# Patient Record
Sex: Female | Born: 1970
Health system: Midwestern US, Academic
[De-identification: ages and names within clinical notes are randomized; demographics above are authoritative.]

## PROBLEM LIST (undated history)

## (undated) DIAGNOSIS — C801 Malignant (primary) neoplasm, unspecified: Secondary | ICD-10-CM

## (undated) DIAGNOSIS — N649 Disorder of breast, unspecified: Secondary | ICD-10-CM

## (undated) DIAGNOSIS — K3184 Gastroparesis: Secondary | ICD-10-CM

## (undated) DIAGNOSIS — C50919 Malignant neoplasm of unspecified site of unspecified female breast: Secondary | ICD-10-CM

## (undated) HISTORY — DX: Disorder of breast, unspecified: N64.9

## (undated) HISTORY — PX: BREAST SURGERY: SHX581

## (undated) HISTORY — PX: BREAST LUMPECTOMY: SHX2

---

## 1999-02-12 HISTORY — PX: TUBAL LIGATION: SHX77

## 2002-10-13 ENCOUNTER — Encounter: Payer: Self-pay | Admitting: Orthopedic Surgery

## 2002-10-13 ENCOUNTER — Ambulatory Visit (HOSPITAL_COMMUNITY): Admission: RE | Admit: 2002-10-13 | Discharge: 2002-10-13 | Payer: Self-pay | Admitting: Orthopedic Surgery

## 2002-10-13 HISTORY — PX: BACK SURGERY: SHX140

## 2002-10-27 ENCOUNTER — Encounter: Payer: Self-pay | Admitting: Neurological Surgery

## 2002-10-27 ENCOUNTER — Ambulatory Visit (HOSPITAL_COMMUNITY): Admission: RE | Admit: 2002-10-27 | Discharge: 2002-10-27 | Payer: Self-pay | Admitting: Neurological Surgery

## 2007-02-23 ENCOUNTER — Other Ambulatory Visit: Admission: RE | Admit: 2007-02-23 | Discharge: 2007-02-23 | Payer: Self-pay | Admitting: Obstetrics and Gynecology

## 2007-07-16 ENCOUNTER — Ambulatory Visit: Payer: Self-pay | Admitting: Internal Medicine

## 2007-07-29 ENCOUNTER — Ambulatory Visit (HOSPITAL_COMMUNITY): Admission: RE | Admit: 2007-07-29 | Discharge: 2007-07-29 | Payer: Self-pay | Admitting: Internal Medicine

## 2007-08-12 HISTORY — PX: ESOPHAGOGASTRODUODENOSCOPY: SHX1529

## 2007-09-10 ENCOUNTER — Ambulatory Visit: Payer: Self-pay | Admitting: Internal Medicine

## 2007-09-10 ENCOUNTER — Ambulatory Visit (HOSPITAL_COMMUNITY): Admission: RE | Admit: 2007-09-10 | Discharge: 2007-09-10 | Payer: Self-pay | Admitting: Internal Medicine

## 2007-09-16 ENCOUNTER — Encounter (HOSPITAL_COMMUNITY): Admission: RE | Admit: 2007-09-16 | Discharge: 2007-10-16 | Payer: Self-pay | Admitting: Internal Medicine

## 2007-12-16 ENCOUNTER — Emergency Department (HOSPITAL_COMMUNITY): Admission: EM | Admit: 2007-12-16 | Discharge: 2007-12-16 | Payer: Self-pay | Admitting: Emergency Medicine

## 2007-12-19 ENCOUNTER — Ambulatory Visit: Payer: Self-pay | Admitting: Internal Medicine

## 2007-12-29 ENCOUNTER — Ambulatory Visit: Payer: Self-pay | Admitting: Internal Medicine

## 2008-01-14 ENCOUNTER — Ambulatory Visit (HOSPITAL_COMMUNITY): Payer: Self-pay | Admitting: Internal Medicine

## 2008-01-14 ENCOUNTER — Encounter (HOSPITAL_COMMUNITY): Admission: RE | Admit: 2008-01-14 | Discharge: 2008-02-10 | Payer: Self-pay | Admitting: Internal Medicine

## 2010-02-11 DIAGNOSIS — C50919 Malignant neoplasm of unspecified site of unspecified female breast: Secondary | ICD-10-CM

## 2010-02-11 HISTORY — DX: Malignant neoplasm of unspecified site of unspecified female breast: C50.919

## 2010-03-04 ENCOUNTER — Encounter: Payer: Self-pay | Admitting: Internal Medicine

## 2010-06-26 NOTE — Assessment & Plan Note (Signed)
[name], [name]                    CHART#:  [id number]   DATE:  07/16/2007                       DOB:  [birth year]   PRIMARY CARE PHYSICIAN:  Self-referred.  Dr. Sharyne Peach, [facility].   CHIEF COMPLAINT:  Intermittent episodes of feculent belching, abdominal  discomfort followed by diarrhea.   The patient is a very pleasant 40 year old Caucasian female who lives  down in [place], [place].  She presents with a 5 year history  of intermittently developing abdominal distention, foul-smelling belches  that smell feculent and the urge to vomit but she never lets herself  vomit.  She has a diminution in bowel function for a day or two  associated with these symptoms and then has diarrhea and is back to  baseline.  The symptoms used to occur at a frequency of once every month  or so but over the past 6 months have become more and more frequent to  the point where she has an episode once weekly.  She denies chronic  gastroesophageal reflux disease symptoms or odynophagia nor dysphagia.  No early satiety.  No abdominal discomfort whatsoever in between these  episodes.  Typically she has 1-2 formed bowel movements daily.  There  has been no melena and no hematochezia.  She denies any prior history of  gastrointestinal illness although she states that she was told she had a  blockage in her intestine as a teenager.  She remembers being severely  constipated back then and had very poor eating habits.  She never really  was hospitalized or had any imaging studies.  Her only surgeries  included tubal ligation and back surgery.  She has not had any GI  surgery and no family history of any gastrointestinal illness.   PAST MEDICAL HISTORY:  Unremarkable for chronic illnesses.   PAST SURGICAL HISTORY:  Tubal ligation, back surgery.   CURRENT MEDICATIONS:  None.   ALLERGIES:  Augmentin causes hives.   FAMILY HISTORY:  Mother has history of recurrent diverticulitis.   Father  has had some problems with atrial fibrillation.  Two sisters and two  brothers all in good health.   SOCIAL HISTORY:  The patient is married.  She has two children.  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  No  tobacco.  Occasional coffee consumption and consumer of alcohol.  No  illicit drugs.   REVIEW OF SYSTEMS:  No chest pain or dyspnea on exertion.  No fever or  chills.  No change in weight.   PHYSICAL EXAMINATION:  GENERAL:  Reveals a pleasant 40 year old lady  resting.  VITAL SIGNS:  Weight 180, height 5 feet 7 inches.  Temperature 98, BP  94/68, pulse 72.  SKIN:  Warm and dry.  No jaundice.  HEENT:  No scleral icterus.  JVD is not prominent.  CHEST:  Lungs are clear to auscultation.  BREASTS:  Exam deferred.  ABDOMEN:  Flat.  Positive bowel sounds.  Soft, nontender, without  appreciable mass or organomegaly.  EXTREMITIES:  No edema.  RECTAL:  Good sphincter tone.  Scant brown stool in the rectal vault.  No masses.  Stool Hemoccult negative.   IMPRESSION:  The patient is a very pleasant 40 year old lady who  presents for further evaluation of worsening symptoms of  feculent  belching, abdominal distention which lasts about 2 days followed by a  short period of diarrhea.  Not mentioned above her sister, [name] RN  insisted that she come see me to evaluate the above mentioned symptoms.   It almost sounds like to me she is having an intermittent partial small  bowel obstruction.  She is virtually asymptomatic in between episodes  which as stated above occur more frequently these days.  The patient  tells me that Dr. Sharyne Peach down in [place], she is now down in  [place] has done recent battery of labs and we would like to get  copies of those studies for review.  I feel we ought to go ahead and  image her GI tract to see if there is any structural lesion contributing  to her symptoms.  To this end I have recommended she go ahead  and have a  contrast CT of the abdomen and pelvis in the near future to further  evaluate her symptoms.  We will make further recommendations once the CT  is available for review.       Jonathon Bellows, M.D.  Electronically Signed     RMR/MEDQ  D:  07/16/2007  T:  07/16/2007  Job:  [id number]

## 2010-06-26 NOTE — Assessment & Plan Note (Signed)
[name], SCHLOTTMAN                    CHART#:  [id number]   DATE:  12/29/2007                       DOB:  August 07, 1970   FOLLOWUP:  Last seen on 09/10/2007 at that time she underwent EGD, which  demonstrated a large gastric diverticulum, but no other abnormalities.  Solid-phase gastric emptying study demonstrated markedly delayed gastric  emptying with only approximately 49% emptying at 2 hours and talked  about the various treatment options.  We would stay away from Reglan and  went with an EES 200 mg per 5 mL one-half teaspoon a.c. and nightly.  She took 2 months worth of therapy.  This pretty much abolished her  nausea and regurgitation symptoms.  She stopped taking it and states she  has continued to do well.  She decided to start taking some Acidophilus  __________ and and probiotic supplement.  She feels this makes her feel  well and has not any regurgitation or abdominal pain.  Bowel function  has been normal.  She has gained 2 pounds since she was last seen here.  Of note, early on in the workup of nausea, we had to plan to get some  labs, they were just recently done.  They included a TSH, which came  back normal at 1.038 and however, a.m. cortisol came back very low at  1.6.  She is really not have any symptoms, otherwise to go with adrenal  insufficiency and as a matter of fact, is pretty much completely  asymptomatic at this time.   CURRENT MEDICATIONS:  Probiotic supplement and ibuprofen p.r.n.   PHYSICAL EXAMINATION:  GENERAL:  Today, pleasant in no acute distress.  VITAL SIGNS:  Weight 182, up 2 pounds, height 5 feet 7 inches,  temperature 98.7, BP 100/70, and pulse 72.  SKIN:  Warm and dry.  No jaundice.  She has no hyperpigmentation.  ABDOMEN:  Flat, positive bowel sounds, so succussion splash.  Abdomen  entirely soft and nontender without appreciable mass or organomegaly.   ASSESSMENT:  Gastroparesis/gastric diverticulum, symptoms quiescent now  on a probiotic.  She is  on prokinetic therapy.  I do not really have any  reason to think that she is not going to have recurrent symptoms in the  future.  She seems to think that the probiotic supplement is helping.  I  certainly do not think it would hurt anything, but doubt it is giving  her any significant pharmacological benefit, but I have  recommended  that she continue taking it as she likes it.  Next hour low fasting a.m.  cortisol of uncertain significance.  I doubt at this point in time with  positive any symptoms that she has adrenal insufficiency, but it is  loosely and we will arrange for have a Cortrosyn stimulation test in the  near future.  We will arrange that down at [facility] in [place] near  where she resides.  We will get those results back when they become  available and go from there.   I told the patient if she starts having any recurrent gastroparesis  symptoms, she is to let me know.       Jonathon Bellows, M.D.  Electronically Signed     RMR/MEDQ  D:  12/29/2007  T:  12/29/2007  Job:  [id number]   cc:  Stana Bunting, M.D.

## 2010-06-26 NOTE — Op Note (Signed)
[name], [name]                   ACCOUNT NO.:  [id number]   MEDICAL RECORD NO.:  [id number]          PATIENT TYPE:  AMB   LOCATION:  DAY                           FACILITY:  [facility]   PHYSICIAN:  R. Roetta Sessions, M.D. DATE OF BIRTH:  01-11-1971   DATE OF PROCEDURE:  09/10/2007  DATE OF DISCHARGE:                               OPERATIVE REPORT   PROCEDURE:  Diagnostic esophagogastroduodenoscopy.   INDICATIONS FOR PROCEDURE:  A 40 year old lady with chronic symptoms  postprandial bloating and feculent belching.   CT of the abdomen demonstrated a relatively large amount of food in her  stomach with a large what appears to be gastric diverticulum (her last  intake of solid food was 15 hours before the study was performed).  With  this information, she is going on 4-5 smaller meals daily and some of  her symptoms have improved, yet they persist.  EGD is now being done.  This approach has been discussed with the patient.  Risks, benefits, and  alternatives, and limitations have been reviewed.  Please see the  documentation in the medical record.   PROCEDURE NOTE:  O2 saturation, blood pressure, pulse, and respirations  were monitored throughout the entire procedure.   CONSCIOUS SEDATION:  Versed 5 mg IV, Demerol 100 mg IV in divided doses.  Cetacaine spray topical pharyngeal anesthesia.   INSTRUMENT:  Pentax video chip system.   FINDINGS:  Examination of the tubular esophagus revealed no mucosal  abnormalities.  EG junction easily traversed.  Stomach:  Gastric cavity  had a minimal amount of granular material and quite a bit of bile-  stained fluid within it showing not much of solid food.  It insufflated  very well with air.  A thorough examination of the gastric mucosa  including retroflexed view of the proximal stomach and esophagogastric  junction demonstrated a large (about 4-5 cm) gastric diverticulum coming  off the fundus and this was a fairly good size cavity with upwards of  5  cm of depth.  It did not have any food contained within it.  There was  no evidence of infiltrating process, ulcer, or other abnormality.  Pylorus was widely patent, easily traversed, and examination of the  bulb, second, and third portion revealed no abnormality.   THERAPEUTIC/DIAGNOSTIC MANEUVERS PERFORMED:  None.   The patient tolerated the procedure well and was reactive in endoscopy.   IMPRESSION:  Normal esophagus, a large gastric diverticulum as described  above, otherwise, normal gastric mucosa.  Patent pylorus normal D1  through D3.   RECOMMENDATIONS:  1. We will go ahead and proceed with a solid-phase gastric emptying      study.  We will also go ahead and do a gallbladder ultrasound at      that time to assess for coexisting occult gallbladder disease.  2. Further recommendations to follow.      Jonathon Bellows, M.D.  Electronically Signed     RMR/MEDQ  D:  09/10/2007  T:  09/10/2007  Job:  [id number]   cc:   Dr. Hillery Jacks [facility]

## 2010-06-29 NOTE — Op Note (Signed)
[name], [name]                             ACCOUNT NO.:  [id number]   MEDICAL RECORD NO.:  [id number]                   PATIENT TYPE:  OIB   LOCATION:  2899                                 FACILITY:  [facility]   PHYSICIAN:  Tia Alert, MD                  DATE OF BIRTH:  Jun 30, 1970   DATE OF PROCEDURE:  10/27/2002  DATE OF DISCHARGE:                                 OPERATIVE REPORT   PREOPERATIVE DIAGNOSES:  Lumbar disk herniation L5-S1 on the right with  right S1 radiculopathy.   POSTOPERATIVE DIAGNOSES:  Lumbar disk herniation L5-S1 on the right with  right S1 radiculopathy.   PROCEDURE:  1. Lumbar hemilaminotomy, medial facetectomy and foraminotomy of L5-S1 on     the right, followed by a microdiskectomy at L5-S1 on the right utilizing     the [facility] retractor system.  2. Microdissection.   SURGEON:  Marikay Alar, M.D.   ASSISTANT:  Donalee Citrin, M.D.   ANESTHESIA:  General endotracheal.   COMPLICATIONS:  None apparent.   INDICATIONS FOR PROCEDURE:  The patient is a 40 year old white female who  was seen in neurosurgical consultation for right leg pain.  She had an MRI  which showed a large herniated disk at L5-S1 on the right, compressing the  right S1 nerve root.  She had tried medical management for quite some time  without significant relief.  I recommended a lumbar microdiskectomy.  She  understood the risks, the benefits and the alternatives, and wished to  proceed.   DESCRIPTION OF PROCEDURE:  The patient was taken to the operating room, and  after the induction of adequate general endotracheal anesthesia she was  rolled into the prone position with the Wilson frame.  All pressure points  were padded.  Her lumbar region was prepped with DuraPrep and then draped in  the usual sterile fashion.  A small dorsal midline incision was made and  then under fluoroscopic guidance a K-wire was passed at the L5-S1 interspace  on the right side, and then sequential dilators  of the METRX retractor  system were passed until the final 6.0 cm retractor was locked into  position.  We then brought in the operating microscope.  The remainder was  done under the operating microscope.  A hemilaminotomy, medial facetectomy  and foraminotomy was performed at L5-S1 on the right utilizing the Kerrison  punches.  The ligament was identified, opened and removed with the Kerrison  punches, to expose the underlying dura and S1 nerve root.  The nerve root  was retracted medially, and the epidural venous vasculature was coagulated  with bipolar cautery and cut sharply.  There was a large subannular and  subligamentous disk herniation which was noted then.  This was incised with  a #15 blade scalpel.  A nerve hook was inserted into this and several large  fragments were removed,  and then we used a pituitary rongeur to perform a  thorough intradiskal diskectomy.  We then palpated with nerve hooks and flat-  headed dissectors to assure that there were no more compressive lesions.  We  then irrigated with copious amounts of Bacitracin containing saline  solution.  Inspected once again to assure that the nerve root was free.  We  then removed the retractor and closed the subcutaneous and the subcuticular  tissues with #3-0 Vicryl and closed the skin with DermaBond.  The drapes  were removed.  A sterile dressing was applied.   The patient was awakened from general anesthesia and transported to the  recovery room in stable condition.  All sponge, needle and instrument counts  were correct.                                                   Tia Alert, MD    DSJ/MEDQ  D:  10/27/2002  T:  10/27/2002  Job:  [id number]

## 2010-09-19 ENCOUNTER — Other Ambulatory Visit (HOSPITAL_COMMUNITY): Payer: Self-pay | Admitting: Obstetrics and Gynecology

## 2010-09-19 DIAGNOSIS — Z139 Encounter for screening, unspecified: Secondary | ICD-10-CM

## 2010-09-27 ENCOUNTER — Other Ambulatory Visit (HOSPITAL_COMMUNITY): Payer: Self-pay | Admitting: Pediatrics

## 2010-09-27 ENCOUNTER — Ambulatory Visit (HOSPITAL_COMMUNITY)
Admission: RE | Admit: 2010-09-27 | Discharge: 2010-09-27 | Disposition: A | Discharge status: Home / Self Care | Payer: BC Managed Care – PPO | Source: Ambulatory Visit | Attending: Pediatrics | Admitting: Pediatrics

## 2010-09-27 ENCOUNTER — Ambulatory Visit (HOSPITAL_COMMUNITY)
Admission: RE | Admit: 2010-09-27 | Discharge: 2010-09-27 | Disposition: A | Discharge status: Home / Self Care | Payer: BC Managed Care – PPO | Source: Ambulatory Visit | Attending: Obstetrics and Gynecology | Admitting: Obstetrics and Gynecology

## 2010-09-27 DIAGNOSIS — R52 Pain, unspecified: Secondary | ICD-10-CM

## 2010-09-27 DIAGNOSIS — Z139 Encounter for screening, unspecified: Secondary | ICD-10-CM

## 2010-09-27 DIAGNOSIS — Z1231 Encounter for screening mammogram for malignant neoplasm of breast: Secondary | ICD-10-CM | POA: Insufficient documentation

## 2010-10-04 ENCOUNTER — Other Ambulatory Visit: Payer: Self-pay | Admitting: Obstetrics and Gynecology

## 2010-10-04 DIAGNOSIS — R928 Other abnormal and inconclusive findings on diagnostic imaging of breast: Secondary | ICD-10-CM

## 2010-11-14 ENCOUNTER — Other Ambulatory Visit: Payer: Self-pay | Admitting: Obstetrics and Gynecology

## 2010-11-14 ENCOUNTER — Ambulatory Visit (HOSPITAL_COMMUNITY)
Admission: RE | Admit: 2010-11-14 | Discharge: 2010-11-14 | Disposition: A | Discharge status: Home / Self Care | Payer: BC Managed Care – PPO | Source: Ambulatory Visit | Attending: Obstetrics and Gynecology | Admitting: Obstetrics and Gynecology

## 2010-11-14 DIAGNOSIS — R921 Mammographic calcification found on diagnostic imaging of breast: Secondary | ICD-10-CM

## 2010-11-14 DIAGNOSIS — R928 Other abnormal and inconclusive findings on diagnostic imaging of breast: Secondary | ICD-10-CM

## 2010-11-16 LAB — CORTISOL-AM, BLOOD: Cortisol - AM: 7.5 ug/dL (ref 4.3–22.4)

## 2010-11-19 ENCOUNTER — Ambulatory Visit
Admission: RE | Admit: 2010-11-19 | Discharge: 2010-11-19 | Disposition: A | Discharge status: Home / Self Care | Payer: BC Managed Care – PPO | Source: Ambulatory Visit | Attending: Obstetrics and Gynecology | Admitting: Obstetrics and Gynecology

## 2010-11-19 DIAGNOSIS — R921 Mammographic calcification found on diagnostic imaging of breast: Secondary | ICD-10-CM

## 2010-12-03 ENCOUNTER — Other Ambulatory Visit (INDEPENDENT_AMBULATORY_CARE_PROVIDER_SITE_OTHER): Payer: Self-pay | Admitting: General Surgery

## 2010-12-03 ENCOUNTER — Encounter (INDEPENDENT_AMBULATORY_CARE_PROVIDER_SITE_OTHER): Payer: Self-pay | Admitting: General Surgery

## 2010-12-03 ENCOUNTER — Ambulatory Visit (INDEPENDENT_AMBULATORY_CARE_PROVIDER_SITE_OTHER): Payer: BC Managed Care – PPO | Admitting: General Surgery

## 2010-12-03 VITALS — BP 132/88 | HR 72 | Temp 98.0°F | Resp 16 | Ht 67.0 in | Wt 198.4 lb

## 2010-12-03 DIAGNOSIS — D0590 Unspecified type of carcinoma in situ of unspecified breast: Secondary | ICD-10-CM

## 2010-12-03 DIAGNOSIS — C50919 Malignant neoplasm of unspecified site of unspecified female breast: Secondary | ICD-10-CM

## 2010-12-03 DIAGNOSIS — D0501 Lobular carcinoma in situ of right breast: Secondary | ICD-10-CM | POA: Insufficient documentation

## 2010-12-03 NOTE — Patient Instructions (Addendum)
you will be scheduled for a right breast biopsy, also noted is a right partial mastectomy with needle localization. This will be done to remove the lobular carcinoma in situ and make sure that there is not any more advanced cancer present. We will decide about further plans after we get the final biopsy report back.Breast Biopsy WHY YOU NEED A BIOPSY Your caregiver has recommended that you have a breast tissue sample taken (biopsy). This is done to be certain that the lump or abnormality found in your breast is not cancerous (malignant). During a biopsy, a small piece of tissue is removed, so it can be examined under a microscope by a specialist (pathologist) who looks at tissues and cells and diagnoses abnormalities in them. Most lumps (tumors) or abnormalities, on or in the breast, are not cancerous (benign). However, biopsies are taken when your caregiver cannot be absolutely certain of what is wrong only from doing a physical exam, mammogram (breast X-ray), or other studies. A breast biopsy can tell you whether nothing more needs to be done, or you need more surgery or another type of treatment. A biopsy is done when there is:  Any undiagnosed breast mass.   Nipple abnormalities, dimpling, crusting, or ulcerations.   Calcium deposits (calcifications) or abnormalities seen on your mammogram, ultrasound, or MRI.   Suspicious changes in the breast (thickening, asymmetry) seen on mammogram.   Abnormal discharge from the nipple, especially blood.   Redness, swelling, and pain of the breast.  HOW A BIOPSY IS PERFORMED A biopsy is often performed on an outpatient basis (you go home the same day). This can be done in a hospital, clinic, or surgical center. Tissue samples (biopsies) are often done under local anesthesia (area is numbed). Sometimes general anesthetics are required, in which case you sleep through the procedure. Biopsies may remove the entire lump, a small piece of the lump, or a small  sliver of tissue removed by needle. TYPES OF BREAST BIOPSY  Fine needle aspiration. A thin needle is placed through the skin, to the lump or cyst, and cells are removed.   Core needle biopsy. A large needle with a special tip is placed through the skin, to the abnormality, and a piece of tissue is removed.   Stereotactic biopsy. A core needle with a special X-ray is used, to direct the needle to the lump or abnormal area, which is difficult to feel or cannot be felt.   Vacuum-assisted biopsy. A hollow probe and a gentle vacuum remove a sample of tissue.   Ultrasound guided core needle biopsy. You lie on your stomach, with your breast through an opening, and a high frequency ultrasound helps guide the needle to the area of the abnormality.   Open biopsy. An incision is made in the breast, and a piece of the lump or the whole lump is removed.  LET YOUR CAREGIVER KNOW ABOUT:  Allergies.   Medicines taken, including herbs, eye drops, over-the-counter medicines, and creams.   Use of steroids (by mouth or creams).   Previous problems with anesthetics or Novocaine.   If you are taking aspirin or blood thinners.   Possibility of pregnancy, if this applies.   History of blood clots (thrombophlebitis).   History of bleeding or blood problems.   Previous surgery.   Other health problems.  RISKS AND COMPLICATIONS   Bleeding.   Infection.   Allergy to medicines.   Bruising and swelling of the breast.   Alteration in the shape of the  breast.   Not finding the lump or abnormality.   Needing more surgery.  BEFORE THE PROCEDURE  You should arrive 60 minutes prior to your procedure or as directed.   Check-in at the admissions desk, to fill out necessary forms, if you are not preregistered.   There will be consent forms to sign, prior to the procedure.   There is a waiting area for your family, while you are having your biopsy.   Try to have someone with you, to drive you  home.   Do not smoke for 2 weeks before the surgery.   Let your caregiver know if you develop a cold or an infection.   Do not drink alcohol for at least 24 hours before surgery.   Wear a good support bra to the surgery.  AFTER THE PROCEDURE  After surgery, you will be taken to the recovery area, where a nurse will watch and check your progress. Once you are awake, stable, and taking fluids well, if there are no other problems, you will be allowed to go home.   Ice packs applied to your operative site may help with discomfort and keep the swelling down.   You may resume normal diet and activities as directed. Avoid strenuous activities affecting the arm on the side of the biopsy, such as tennis, swimming, heavy lifting (more than 10 pounds) or pulling.   Bruising in the breast is normal following this procedure.   Wearing a support bra, even to bed, may be more comfortable. The bra will also help keep the dressing on.   Change dressings as directed.   Your doctor may apply a pressure dressing on your breast for 24 to 48 hours.   Only take over-the-counter or prescription medicines for pain, discomfort, or fever as directed by your caregiver.   Do not take aspirin, because it can cause bleeding.  HOME CARE INSTRUCTIONS   You may resume your usual diet.   Have someone drive you home after the surgery.   Do not do any exercise, driving, lifting or general activities without your caregiver's permission.   Take medicines and over-the-counter medicines, as ordered by your caregiver.   Keep your postoperative appointments as recommended.   Do not drink alcohol while taking pain medicine.  Finding out the results of your test Not all test results are available during your visit. If your test results are not back during the visit, make an appointment with your caregiver to find out the results. Do not assume everything is normal if you have not heard from your caregiver or the  medical facility. It is important for you to follow up on all of your test results.  SEEK MEDICAL CARE IF:   You notice redness, swelling, or increasing pain in the wound.   You notice a bad smell coming from the wound or dressing.   You develop a rash.   You need stronger pain medicine.   You are having an allergic reaction or problems with your medicines.  SEEK IMMEDIATE MEDICAL CARE IF:   You have difficulty breathing.   You have a fever.   There is increased bleeding (more than a small spot) from the wound.   Pus is coming from the wound.   The wound is breaking open.  Document Released: 01/28/2005 Document Revised: 10/10/2010 Document Reviewed: 12/16/2008 [facility] Patient Information 2012 [place], [place].

## 2010-12-03 NOTE — Progress Notes (Signed)
Chief Complaint  Patient presents with  . Other    new pt- eval rt br LCIS    HPI [name] is a 40 y.o. female.    This healthy woman was referred to me by Dr. Norva Pavlov admission at the [facility] for evaluation of an area in the lateral right breast which was biopsied recently and revealed lobular carcinoma in situ.  The patient does not have any breast symptoms. Has never had a breast problem in the past. Has no pain, mass, or nipple discharge.  She went for her first ever screening mammogram. This reveals some focal calcifications in the right breast laterally. This is reportedly about a 4 mm area. Image guided biopsy was ultimately done which reveals lobular carcinoma in situ. The E Cadherin stain is negative confirming the lobular phenotype. Homone receptors were done and there was strongly positive.  The family history is negative for breast cancer and is negative for ovarian cancer. One uncle had pancreatic cancer.HPI  Past Medical History  Diagnosis Date  . Breast calcification, right     Past Surgical History  Procedure Date  . Breast biopsy 11/12/10    right  . Back surgery   . Tubal ligation 2001    Family History  Problem Relation Age of Onset  . Hypertension Mother   . Hyperlipidemia Mother   . Heart disease Father     arrythmia  . Hypertension Father   . Cancer Maternal Uncle     pancreatic    Social History History  Substance Use Topics  . Smoking status: Former Games developer  . Smokeless tobacco: Not on file   Comment: quit 10 yrs  . Alcohol Use: Yes    Allergies  Allergen Reactions  . Augmentin     Current Outpatient Prescriptions  Medication Sig Dispense Refill  . ibuprofen (ADVIL,MOTRIN) 200 MG tablet Take 200 mg by mouth every 6 (six) hours as needed.        . Pseudoephedrine HCl (SINUS DECONGESTANT PO) Take by mouth daily.          Review of Systems Review of Systems  Constitutional: Negative.  Negative for fever, chills and unexpected  weight change.  HENT: Positive for congestion. Negative for hearing loss, sore throat, trouble swallowing and voice change.   Eyes: Negative.  Negative for visual disturbance.  Respiratory: Positive for cough. Negative for apnea, chest tightness, shortness of breath and wheezing.   Cardiovascular: Negative.  Negative for chest pain, palpitations and leg swelling.  Gastrointestinal: Negative.  Negative for nausea, vomiting, abdominal pain, diarrhea, constipation, blood in stool, abdominal distention and anal bleeding.  Genitourinary: Negative.  Negative for hematuria, vaginal bleeding and difficulty urinating.  Musculoskeletal: Negative for arthralgias.  Skin: Negative for rash and wound.  Neurological: Positive for headaches. Negative for seizures and syncope.  Hematological: Negative.  Negative for adenopathy. Does not bruise/bleed easily.  Psychiatric/Behavioral: Negative.  Negative for confusion.    Blood pressure 132/88, pulse 72, temperature 98 F (36.7 C), temperature source Temporal, resp. rate 16, height 5\' 7"  (1.702 m), weight 198 lb 6.4 oz (89.994 kg).  Physical Exam Physical Exam  Constitutional: She is oriented to person, place, and time. She appears well-developed and well-nourished. No distress.  HENT:  Head: Normocephalic and atraumatic.  Nose: Nose normal.  Mouth/Throat: No oropharyngeal exudate.  Eyes: Conjunctivae and EOM are normal. Pupils are equal, round, and reactive to light. Left eye exhibits no discharge. No scleral icterus.  Neck: Neck supple. No JVD  present. No tracheal deviation present. No thyromegaly present.  Cardiovascular: Normal rate, regular rhythm, normal heart sounds and intact distal pulses.   No murmur heard. Pulmonary/Chest: Effort normal and breath sounds normal. No respiratory distress. She has no wheezes. She has no rales. She exhibits no tenderness.         Breasts moderately large.  Abdominal: Soft. Bowel sounds are normal. She exhibits no  distension and no mass. There is no tenderness. There is no rebound and no guarding.  Musculoskeletal: She exhibits no edema and no tenderness.  Lymphadenopathy:    She has no cervical adenopathy.  Neurological: She is alert and oriented to person, place, and time. She exhibits normal muscle tone. Coordination normal.  Skin: Skin is warm. No rash noted. She is not diaphoretic. No erythema. No pallor.  Psychiatric: She has a normal mood and affect. Her behavior is normal. Judgment and thought content normal.    Data Reviewed I reviewed reviewed her mammograms, and the mammogram images and the pathology report as well as the breast diagnostic profile.  Assessment    Lobular carcinoma in situ right breast, 9 o'clock position, small focal area. ER/PR positive. Excisional biopsy is indicated to exclude occult invasive carcinoma.    Plan    I had a long conversation with the patient and her husband about lobular carcinoma in situ. We discussed the differences between lobular carcinoma in situ and ductal carcinoma in situ as well as invasive cancer. She is aware that LCIS is a marker lesion for increased risk but does not require specific treatment other than clarification of diagnosis. She seems to understand all of this fairly well.  We are going to schedule her for a right partial mastectomy with needle localization in the near future.  If this is all lobular carcinoma in situ, consideration will be given to referring her to a high risk breast clinic postop.  I've discussed indications and details of surgery with the patient and her husband. Risks and complications have been outlined, including but limited to bleeding, infection, reoperation for positive margins, cosmetic deformity, nerve damage with chronic pain or numbness, cardiac pulmonary and thromboembolic problems. She seems to understand these issues well. The tunnel for questions were answered. She is in full agreement with this plan.        Kennah Hehr M 12/03/2010, 2:01 PM

## 2010-12-14 ENCOUNTER — Encounter (HOSPITAL_BASED_OUTPATIENT_CLINIC_OR_DEPARTMENT_OTHER): Payer: Self-pay | Admitting: *Deleted

## 2010-12-14 NOTE — Pre-Procedure Instructions (Signed)
To come for CMET, CBC, diff, UA

## 2010-12-17 ENCOUNTER — Inpatient Hospital Stay (HOSPITAL_BASED_OUTPATIENT_CLINIC_OR_DEPARTMENT_OTHER)
Admission: RE | Admit: 2010-12-17 | Discharge: 2010-12-17 | Disposition: A | Discharge status: Home / Self Care | Payer: BC Managed Care – PPO | Source: Ambulatory Visit

## 2010-12-17 LAB — URINALYSIS, ROUTINE W REFLEX MICROSCOPIC
Hgb urine dipstick: NEGATIVE
Nitrite: NEGATIVE
Protein, ur: NEGATIVE mg/dL
Specific Gravity, Urine: 1.005 (ref 1.005–1.030)
Urobilinogen, UA: 0.2 mg/dL (ref 0.0–1.0)

## 2010-12-17 LAB — DIFFERENTIAL
Basophils Absolute: 0 10*3/uL (ref 0.0–0.1)
Eosinophils Relative: 2 % (ref 0–5)
Lymphocytes Relative: 37 % (ref 12–46)
Lymphs Abs: 2.5 10*3/uL (ref 0.7–4.0)
Monocytes Absolute: 0.5 10*3/uL (ref 0.1–1.0)
Monocytes Relative: 7 % (ref 3–12)
Neutro Abs: 3.7 10*3/uL (ref 1.7–7.7)

## 2010-12-17 LAB — COMPREHENSIVE METABOLIC PANEL
BUN: 12 mg/dL (ref 6–23)
CO2: 27 mEq/L (ref 19–32)
Calcium: 10.5 mg/dL (ref 8.4–10.5)
Chloride: 101 mEq/L (ref 96–112)
Creatinine, Ser: 0.89 mg/dL (ref 0.50–1.10)
GFR calc Af Amer: >90 mL/min (ref >90)
GFR calc non Af Amer: 80 mL/min — ABNORMAL LOW (ref >90)
Glucose, Bld: 95 mg/dL (ref 70–99)
Total Bilirubin: 0.4 mg/dL (ref 0.3–1.2)

## 2010-12-17 LAB — CBC
HCT: 44.4 % (ref 36.0–46.0)
Hemoglobin: 15.5 g/dL — ABNORMAL HIGH (ref 12.0–15.0)
MCV: 88.3 fL (ref 78.0–100.0)
RDW: 12.4 % (ref 11.5–15.5)
WBC: 6.8 10*3/uL (ref 4.0–10.5)

## 2010-12-18 ENCOUNTER — Other Ambulatory Visit (INDEPENDENT_AMBULATORY_CARE_PROVIDER_SITE_OTHER): Payer: Self-pay | Admitting: General Surgery

## 2010-12-18 NOTE — H&P (Signed)
[name]   12/03/2010 1:30 PM Office Visit  MRN: [id number]   Description: 40 year old female  Provider: Ernestene Mention, MD  Department: [facility]        Diagnoses     Lobular carcinoma in situ of right breast   - Primary    233.0      Reason for Visit     Other    new pt- eval rt br LCIS        Vitals - Last Recorded       BP Pulse Temp(Src) Resp Ht Wt    132/88  72  98 F (36.7 C) (Temporal)  16  5\' 7"  (1.702 m)  198 lb 6.4 oz (89.994 kg)          BMI LMP            31.07 kg/m2  11/28/2010               Progress Notes     Ernestene Mention, MD  12/03/2010  2:11 PM  SignedChief Complaint   Patient presents with   .  Other       new pt- eval rt br LCIS      HPI [name] is a 40 y.o. female.     This healthy woman was referred to me by Dr. Norva Pavlov admission at the [facility] for evaluation of an area in the lateral right breast which was biopsied recently and revealed lobular carcinoma in situ.   The patient does not have any breast symptoms. Has never had a breast problem in the past. Has no pain, mass, or nipple discharge.   She went for her first ever screening mammogram. This reveals some focal calcifications in the right breast laterally. This is reportedly about a 4 mm area. Image guided biopsy was ultimately done which reveals lobular carcinoma in situ. The E Cadherin stain is negative confirming the lobular phenotype. Homone receptors were done and there was strongly positive.   The family history is negative for breast cancer and is negative for ovarian cancer. One uncle had pancreatic cancer.HPI    Past Medical History   Diagnosis  Date   .  Breast calcification, right         Past Surgical History   Procedure  Date   .  Breast biopsy  11/12/10       right   .  Back surgery     .  Tubal ligation  2001       Family History   Problem  Relation  Age of Onset   .  Hypertension  Mother     .  Hyperlipidemia  Mother     .   Heart disease  Father         arrythmia   .  Hypertension  Father     .  Cancer  Maternal Uncle         pancreatic      Social History History   Substance Use Topics   .  Smoking status:  Former Games developer   .  Smokeless tobacco:  Not on file     Comment: quit 10 yrs   .  Alcohol Use:  Yes       Allergies   Allergen  Reactions   .  Augmentin         Current Outpatient Prescriptions   Medication  Sig  Dispense  Refill   .  ibuprofen (  ADVIL,MOTRIN) 200 MG tablet  Take 200 mg by mouth every 6 (six) hours as needed.           .  Pseudoephedrine HCl (SINUS DECONGESTANT PO)  Take by mouth daily.              Review of Systems Review of Systems  Constitutional: Negative.  Negative for fever, chills and unexpected weight change.  HENT: Positive for congestion. Negative for hearing loss, sore throat, trouble swallowing and voice change.   Eyes: Negative.  Negative for visual disturbance.  Respiratory: Positive for cough. Negative for apnea, chest tightness, shortness of breath and wheezing.   Cardiovascular: Negative.  Negative for chest pain, palpitations and leg swelling.  Gastrointestinal: Negative.  Negative for nausea, vomiting, abdominal pain, diarrhea, constipation, blood in stool, abdominal distention and anal bleeding.  Genitourinary: Negative.  Negative for hematuria, vaginal bleeding and difficulty urinating.  Musculoskeletal: Negative for arthralgias.  Skin: Negative for rash and wound.  Neurological: Positive for headaches. Negative for seizures and syncope.  Hematological: Negative.  Negative for adenopathy. Does not bruise/bleed easily.  Psychiatric/Behavioral: Negative.  Negative for confusion.    Blood pressure 132/88, pulse 72, temperature 98 F (36.7 C), temperature source Temporal, resp. rate 16, height 5\' 7"  (1.702 m), weight 198 lb 6.4 oz (89.994 kg).   Physical Exam Physical Exam  Constitutional: She is oriented to person, place, and time. She appears  well-developed and well-nourished. No distress.  HENT:   Head: Normocephalic and atraumatic.   Nose: Nose normal.   Mouth/Throat: No oropharyngeal exudate.  Eyes: Conjunctivae and EOM are normal. Pupils are equal, round, and reactive to light. Left eye exhibits no discharge. No scleral icterus.  Neck: Neck supple. No JVD present. No tracheal deviation present. No thyromegaly present.  Cardiovascular: Normal rate, regular rhythm, normal heart sounds and intact distal pulses.    No murmur heard. Pulmonary/Chest: Effort normal and breath sounds normal. No respiratory distress. She has no wheezes. She has no rales. She exhibits no tenderness.         Breasts moderately large.  Abdominal: Soft. Bowel sounds are normal. She exhibits no distension and no mass. There is no tenderness. There is no rebound and no guarding.  Musculoskeletal: She exhibits no edema and no tenderness.  Lymphadenopathy:    She has no cervical adenopathy.  Neurological: She is alert and oriented to person, place, and time. She exhibits normal muscle tone. Coordination normal.  Skin: Skin is warm. No rash noted. She is not diaphoretic. No erythema. No pallor.  Psychiatric: She has a normal mood and affect. Her behavior is normal. Judgment and thought content normal.    Data Reviewed I reviewed reviewed her mammograms, and the mammogram images and the pathology report as well as the breast diagnostic profile.   Assessment   Lobular carcinoma in situ right breast, 9 o'clock position, small focal area. ER/PR positive. Excisional biopsy is indicated to exclude occult invasive carcinoma.   Plan I had a long conversation with the patient and her husband about lobular carcinoma in situ. We discussed the differences between lobular carcinoma in situ and ductal carcinoma in situ as well as invasive cancer. She is aware that LCIS is a marker lesion for increased risk but does not require specific treatment other than  clarification of diagnosis. She seems to understand all of this fairly well.   We are going to schedule her for a right partial mastectomy with needle localization in the near future.  If this is all lobular carcinoma in situ, consideration will be given to referring her to a high risk breast clinic postop.   I've discussed indications and details of surgery with the patient and her husband. Risks and complications have been outlined, including but limited to bleeding, infection, reoperation for positive margins, cosmetic deformity, nerve damage with chronic pain or numbness, cardiac pulmonary and thromboembolic problems. She seems to understand these issues well. The tunnel for questions were answered. She is in full agreement with this plan.       Ernestene Mention 12/03/2010, 2:01 PM                Not recorded             Patient Instructions     you will be scheduled for a right breast biopsy, also noted is a right partial mastectomy with needle localization. This will be done to remove the lobular carcinoma in situ and make sure that there is not any more advanced cancer present. We will decide about further plans after we get the final biopsy report back.Breast Biopsy WHY YOU NEED A BIOPSY  Your caregiver has recommended that you have a breast tissue sample taken (biopsy). This is done to be certain that the lump or abnormality found in your breast is not cancerous (malignant). During a biopsy, a small piece of tissue is removed, so it can be examined under a microscope by a specialist (pathologist) who looks at tissues and cells and diagnoses abnormalities in them. Most lumps (tumors) or abnormalities, on or in the breast, are not cancerous (benign). However, biopsies are taken when your caregiver cannot be absolutely certain of what is wrong only from doing a physical exam, mammogram (breast X-ray), or other studies. A breast biopsy can tell you whether nothing more  needs to be done, or you need more surgery or another type of treatment. A biopsy is done when there is: Any undiagnosed breast mass.   Nipple abnormalities, dimpling, crusting, or ulcerations.   Calcium deposits (calcifications) or abnormalities seen on your mammogram, ultrasound, or MRI.   Suspicious changes in the breast (thickening, asymmetry) seen on mammogram.   Abnormal discharge from the nipple, especially blood.   Redness, swelling, and pain of the breast.  HOW A BIOPSY IS PERFORMED A biopsy is often performed on an outpatient basis (you go home the same day). This can be done in a hospital, clinic, or surgical center. Tissue samples (biopsies) are often done under local anesthesia (area is numbed). Sometimes general anesthetics are required, in which case you sleep through the procedure. Biopsies may remove the entire lump, a small piece of the lump, or a small sliver of tissue removed by needle. TYPES OF BREAST BIOPSY Fine needle aspiration. A thin needle is placed through the skin, to the lump or cyst, and cells are removed.   Core needle biopsy. A large needle with a special tip is placed through the skin, to the abnormality, and a piece of tissue is removed.   Stereotactic biopsy. A core needle with a special X-ray is used, to direct the needle to the lump or abnormal area, which is difficult to feel or cannot be felt.   Vacuum-assisted biopsy. A hollow probe and a gentle vacuum remove a sample of tissue.   Ultrasound guided core needle biopsy. You lie on your stomach, with your breast through an opening, and a high frequency ultrasound helps guide the needle to the area of the abnormality.  Open biopsy. An incision is made in the breast, and a piece of the lump or the whole lump is removed.  LET YOUR CAREGIVER KNOW ABOUT: Allergies.   Medicines taken, including herbs, eye drops, over-the-counter medicines, and creams.   Use of steroids (by mouth or creams).   Previous problems  with anesthetics or Novocaine.   If you are taking aspirin or blood thinners.   Possibility of pregnancy, if this applies.   History of blood clots (thrombophlebitis).   History of bleeding or blood problems.   Previous surgery.   Other health problems.  RISKS AND COMPLICATIONS   Bleeding.   Infection.   Allergy to medicines.   Bruising and swelling of the breast.   Alteration in the shape of the breast.   Not finding the lump or abnormality.   Needing more surgery.  BEFORE THE PROCEDURE You should arrive 60 minutes prior to your procedure or as directed.   Check-in at the admissions desk, to fill out necessary forms, if you are not preregistered.   There will be consent forms to sign, prior to the procedure.   There is a waiting area for your family, while you are having your biopsy.   Try to have someone with you, to drive you home.   Do not smoke for 2 weeks before the surgery.   Let your caregiver know if you develop a cold or an infection.   Do not drink alcohol for at least 24 hours before surgery.   Wear a good support bra to the surgery.  AFTER THE PROCEDURE After surgery, you will be taken to the recovery area, where a nurse will watch and check your progress. Once you are awake, stable, and taking fluids well, if there are no other problems, you will be allowed to go home.   Ice packs applied to your operative site may help with discomfort and keep the swelling down.   You may resume normal diet and activities as directed. Avoid strenuous activities affecting the arm on the side of the biopsy, such as tennis, swimming, heavy lifting (more than 10 pounds) or pulling.   Bruising in the breast is normal following this procedure.   Wearing a support bra, even to bed, may be more comfortable. The bra will also help keep the dressing on.   Change dressings as directed.   Your doctor may apply a pressure dressing on your breast for 24 to 48 hours.   Only take over-the-counter or  prescription medicines for pain, discomfort, or fever as directed by your caregiver.   Do not take aspirin, because it can cause bleeding.  HOME CARE INSTRUCTIONS   You may resume your usual diet.   Have someone drive you home after the surgery.   Do not do any exercise, driving, lifting or general activities without your caregiver's permission.   Take medicines and over-the-counter medicines, as ordered by your caregiver.   Keep your postoperative appointments as recommended.   Do not drink alcohol while taking pain medicine.  Finding out the results of your test Not all test results are available during your visit. If your test results are not back during the visit, make an appointment with your caregiver to find out the results. Do not assume everything is normal if you have not heard from your caregiver or the medical facility. It is important for you to follow up on all of your test results.   SEEK MEDICAL CARE IF:   You notice redness,  swelling, or increasing pain in the wound.   You notice a bad smell coming from the wound or dressing.   You develop a rash.   You need stronger pain medicine.   You are having an allergic reaction or problems with your medicines.  SEEK IMMEDIATE MEDICAL CARE IF:   You have difficulty breathing.   You have a fever.   There is increased bleeding (more than a small spot) from the wound.   Pus is coming from the wound.   The wound is breaking open.  Document Released: 01/28/2005 Document Revised: 10/10/2010 Document Reviewed: 12/16/2008 [facility] Patient Information 2012 [place], [place].        Patient Instructions History Recorded      Level of Service     PR OFFICE CONSULTATION,LEVEL III [id number]      Follow-up and Disposition     Return for schedule surgery.        All Flowsheet Templates (all recorded)     Encounter Vitals Flowsheet    Custom Formula Data Flowsheet    Anthropometrics Flowsheet               Referring Provider            Ara Kussmaul       All Charges for This Encounter       Code Description Service Date Service Provider Modifiers Quantity    [phone] PR OFFICE CONSULTATION,LEVEL III 12/03/2010 Ernestene Mention, MD   1        Other Encounter Related Information     Allergies & Medications         Problem List         History         Patient-Entered Questionnaires     No data filed

## 2010-12-19 ENCOUNTER — Encounter (HOSPITAL_BASED_OUTPATIENT_CLINIC_OR_DEPARTMENT_OTHER): Payer: Self-pay | Admitting: *Deleted

## 2010-12-19 ENCOUNTER — Ambulatory Visit (HOSPITAL_BASED_OUTPATIENT_CLINIC_OR_DEPARTMENT_OTHER): Payer: BC Managed Care – PPO

## 2010-12-19 ENCOUNTER — Encounter (HOSPITAL_BASED_OUTPATIENT_CLINIC_OR_DEPARTMENT_OTHER): Payer: Self-pay

## 2010-12-19 ENCOUNTER — Encounter (HOSPITAL_BASED_OUTPATIENT_CLINIC_OR_DEPARTMENT_OTHER)
Admission: RE | Disposition: A | Discharge status: Home / Self Care | Payer: Self-pay | Source: Ambulatory Visit | Attending: General Surgery

## 2010-12-19 ENCOUNTER — Ambulatory Visit
Admission: RE | Admit: 2010-12-19 | Discharge: 2010-12-19 | Disposition: A | Discharge status: Home / Self Care | Payer: BC Managed Care – PPO | Source: Ambulatory Visit | Attending: General Surgery | Admitting: General Surgery

## 2010-12-19 ENCOUNTER — Ambulatory Visit (HOSPITAL_BASED_OUTPATIENT_CLINIC_OR_DEPARTMENT_OTHER)
Admission: RE | Admit: 2010-12-19 | Discharge: 2010-12-19 | Disposition: A | Discharge status: Home / Self Care | Payer: BC Managed Care – PPO | Source: Ambulatory Visit | Attending: General Surgery | Admitting: General Surgery

## 2010-12-19 DIAGNOSIS — D0590 Unspecified type of carcinoma in situ of unspecified breast: Secondary | ICD-10-CM | POA: Insufficient documentation

## 2010-12-19 DIAGNOSIS — C50919 Malignant neoplasm of unspecified site of unspecified female breast: Secondary | ICD-10-CM

## 2010-12-19 DIAGNOSIS — Z01812 Encounter for preprocedural laboratory examination: Secondary | ICD-10-CM | POA: Insufficient documentation

## 2010-12-19 DIAGNOSIS — E669 Obesity, unspecified: Secondary | ICD-10-CM | POA: Insufficient documentation

## 2010-12-19 HISTORY — DX: Malignant (primary) neoplasm, unspecified: C80.1

## 2010-12-19 SURGERY — MASTECTOMY PARTIAL
Anesthesia: General | Laterality: Right

## 2010-12-19 MED ORDER — LACTATED RINGERS IV SOLN
INTRAVENOUS | Status: DC
  Administered 2010-12-19 (×2): via INTRAVENOUS

## 2010-12-19 MED ORDER — MEPERIDINE HCL 25 MG/ML IJ SOLN: 6.2500 mg | INTRAMUSCULAR | Status: DC | PRN

## 2010-12-19 MED ORDER — ONDANSETRON HCL 4 MG/2ML IJ SOLN
INTRAMUSCULAR | Status: DC | PRN
  Administered 2010-12-19: 4 mg via INTRAVENOUS

## 2010-12-19 MED ORDER — PROMETHAZINE HCL 25 MG RE SUPP
25.0000 mg | Freq: Once | RECTAL | Status: AC
  Administered 2010-12-19: 25 mg via RECTAL

## 2010-12-19 MED ORDER — VANCOMYCIN HCL IN DEXTROSE 1-5 GM/200ML-% IV SOLN
1000.0000 mg | INTRAVENOUS | Status: AC
  Administered 2010-12-19: 1000 mg via INTRAVENOUS

## 2010-12-19 MED ORDER — MIDAZOLAM HCL 5 MG/5ML IJ SOLN
INTRAMUSCULAR | Status: DC | PRN
  Administered 2010-12-19: 2 mg via INTRAVENOUS

## 2010-12-19 MED ORDER — LIDOCAINE HCL (CARDIAC) 20 MG/ML IV SOLN
INTRAVENOUS | Status: DC | PRN
  Administered 2010-12-19: 100 mg via INTRAVENOUS

## 2010-12-19 MED ORDER — DEXAMETHASONE SODIUM PHOSPHATE 4 MG/ML IJ SOLN
INTRAMUSCULAR | Status: DC | PRN
  Administered 2010-12-19: 10 mg via INTRAVENOUS

## 2010-12-19 MED ORDER — HYDROMORPHONE HCL PF 1 MG/ML IJ SOLN
0.2500 mg | INTRAMUSCULAR | Status: DC | PRN
  Administered 2010-12-19 (×2): 0.5 mg via INTRAVENOUS

## 2010-12-19 MED ORDER — BUPIVACAINE-EPINEPHRINE 0.5% -1:200000 IJ SOLN
INTRAMUSCULAR | Status: DC | PRN
  Administered 2010-12-19: 10 mL

## 2010-12-19 MED ORDER — FENTANYL CITRATE 0.05 MG/ML IJ SOLN
INTRAMUSCULAR | Status: DC | PRN
  Administered 2010-12-19: 50 ug via INTRAVENOUS
  Administered 2010-12-19: 100 ug via INTRAVENOUS

## 2010-12-19 MED ORDER — HYDROCODONE-ACETAMINOPHEN 5-325 MG PO TABS: 1.0000 | ORAL_TABLET | ORAL | Status: AC | PRN

## 2010-12-19 MED ORDER — PROMETHAZINE HCL 25 MG/ML IJ SOLN: 6.2500 mg | INTRAMUSCULAR | Status: DC | PRN

## 2010-12-19 MED ORDER — PROPOFOL 10 MG/ML IV EMUL
INTRAVENOUS | Status: DC | PRN
  Administered 2010-12-19: 200 mg via INTRAVENOUS

## 2010-12-19 MED ORDER — KETOROLAC TROMETHAMINE 30 MG/ML IJ SOLN
INTRAMUSCULAR | Status: DC | PRN
  Administered 2010-12-19: 30 mg via INTRAVENOUS

## 2010-12-19 SURGICAL SUPPLY — 54 items
BENZOIN TINCTURE PRP APPL 2/3 (GAUZE/BANDAGES/DRESSINGS) IMPLANT
BLADE HEX COATED 2.75 (ELECTRODE) ×2 IMPLANT
BLADE SURG 15 STRL LF DISP TIS (BLADE) ×2 IMPLANT
BLADE SURG 15 STRL SS (BLADE) ×2
CANISTER SUCTION 1200CC (MISCELLANEOUS) ×2 IMPLANT
CHLORAPREP W/TINT 26ML (MISCELLANEOUS) ×2 IMPLANT
COVER MAYO STAND STRL (DRAPES) ×2 IMPLANT
COVER TABLE BACK 60X90 (DRAPES) ×2 IMPLANT
DECANTER SPIKE VIAL GLASS SM (MISCELLANEOUS) IMPLANT
DERMABOND ADVANCED (GAUZE/BANDAGES/DRESSINGS)
DERMABOND ADVANCED .7 DNX12 (GAUZE/BANDAGES/DRESSINGS) IMPLANT
DEVICE DUBIN W/COMP PLATE 8390 (MISCELLANEOUS) ×2 IMPLANT
DRAPE LAPAROTOMY TRNSV 102X78 (DRAPE) IMPLANT
DRAPE PED LAPAROTOMY (DRAPES) ×2 IMPLANT
DRAPE UTILITY XL STRL (DRAPES) ×2 IMPLANT
DRSG EMULSION OIL 3X3 NADH (GAUZE/BANDAGES/DRESSINGS) IMPLANT
ELECT REM PT RETURN 9FT ADLT (ELECTROSURGICAL) ×2
ELECTRODE REM PT RTRN 9FT ADLT (ELECTROSURGICAL) ×1 IMPLANT
GAUZE SPONGE 4X4 12PLY STRL LF (GAUZE/BANDAGES/DRESSINGS) IMPLANT
GAUZE SPONGE 4X4 16PLY XRAY LF (GAUZE/BANDAGES/DRESSINGS) IMPLANT
GLOVE BIOGEL PI IND STRL 6.5 (GLOVE) ×2 IMPLANT
GLOVE BIOGEL PI IND STRL 7.0 (GLOVE) ×1 IMPLANT
GLOVE BIOGEL PI INDICATOR 6.5 (GLOVE) ×2
GLOVE BIOGEL PI INDICATOR 7.0 (GLOVE) ×1
GLOVE EUDERMIC 7 POWDERFREE (GLOVE) ×2 IMPLANT
GOWN PREVENTION PLUS XLARGE (GOWN DISPOSABLE) ×2 IMPLANT
GOWN PREVENTION PLUS XXLARGE (GOWN DISPOSABLE) ×2 IMPLANT
KIT MARKER MARGIN INK (KITS) ×2 IMPLANT
NEEDLE HYPO 22GX1.5 SAFETY (NEEDLE) IMPLANT
NEEDLE HYPO 25X1 1.5 SAFETY (NEEDLE) ×2 IMPLANT
NS IRRIG 1000ML POUR BTL (IV SOLUTION) ×2 IMPLANT
PACK BASIN DAY SURGERY FS (CUSTOM PROCEDURE TRAY) ×2 IMPLANT
PENCIL BUTTON HOLSTER BLD 10FT (ELECTRODE) ×2 IMPLANT
SLEEVE SCD COMPRESS KNEE MED (MISCELLANEOUS) ×2 IMPLANT
STAPLER VISISTAT 35W (STAPLE) IMPLANT
STRIP CLOSURE SKIN 1/2X4 (GAUZE/BANDAGES/DRESSINGS) IMPLANT
SUT ETHILON 4 0 PS 2 18 (SUTURE) IMPLANT
SUT MON AB 4-0 PC3 18 (SUTURE) IMPLANT
SUT SILK 2 0 SH (SUTURE) IMPLANT
SUT VIC AB 2-0 SH 27 (SUTURE) ×1
SUT VIC AB 2-0 SH 27XBRD (SUTURE) ×1 IMPLANT
SUT VIC AB 3-0 FS2 27 (SUTURE) IMPLANT
SUT VIC AB 4-0 P-3 18XBRD (SUTURE) IMPLANT
SUT VIC AB 4-0 P3 18 (SUTURE)
SUT VICRYL 3-0 CR8 SH (SUTURE) IMPLANT
SUT VICRYL 4-0 PS2 18IN ABS (SUTURE) IMPLANT
SYR BULB 3OZ (MISCELLANEOUS) ×2 IMPLANT
SYR CONTROL 10ML LL (SYRINGE) ×2 IMPLANT
TAPE HYPAFIX 4 X10 (GAUZE/BANDAGES/DRESSINGS) IMPLANT
TOWEL OR NON WOVEN STRL DISP B (DISPOSABLE) ×2 IMPLANT
TRAY DSU PREP LF (CUSTOM PROCEDURE TRAY) ×2 IMPLANT
TUBE CONNECTING 20X1/4 (TUBING) ×2 IMPLANT
WATER STERILE IRR 1000ML POUR (IV SOLUTION) ×2 IMPLANT
YANKAUER SUCT BULB TIP NO VENT (SUCTIONS) ×2 IMPLANT

## 2010-12-19 NOTE — Transfer of Care (Signed)
Immediate Anesthesia Transfer of Care Note  Patient: [name]  Procedure(s) Performed:  MASTECTOMY PARTIAL - right partial mastectomy and needle localization  Patient Location: PACU  Anesthesia Type: General  Level of Consciousness: awake, alert  and sedated  Airway & Oxygen Therapy: Patient Spontanous Breathing and Patient connected to face mask oxygen  Post-op Assessment: Report given to PACU RN, Post -op Vital signs reviewed and stable and Patient moving all extremities  Post vital signs: Reviewed and stable  Complications: No apparent anesthesia complications

## 2010-12-19 NOTE — Anesthesia Preprocedure Evaluation (Addendum)
Anesthesia Evaluation  Patient identified by MRN, date of birth, ID band Patient awake    Reviewed: Allergy & Precautions, H&P , NPO status , Patient's Chart, lab work & pertinent test results  Airway Mallampati: I TM Distance: >3 FB Neck ROM: Full    Dental No notable dental hx.    Pulmonary neg pulmonary ROS,    Pulmonary exam normal       Cardiovascular neg cardio ROS Regular Normal    Neuro/Psych    GI/Hepatic   Endo/Other    Renal/GU      Musculoskeletal   Abdominal (+) obese,   Peds  Hematology   Anesthesia Other Findings   Reproductive/Obstetrics                           Anesthesia Physical Anesthesia Plan  ASA: I  Anesthesia Plan: General   Post-op Pain Management:    Induction: Intravenous  Airway Management Planned: LMA  Additional Equipment:   Intra-op Plan:   Post-operative Plan: Extubation in OR  Informed Consent: I have reviewed the patients History and Physical, chart, labs and discussed the procedure including the risks, benefits and alternatives for the proposed anesthesia with the patient or authorized representative who has indicated his/her understanding and acceptance.   Dental Advisory Given  Plan Discussed with: CRNA, Surgeon and Anesthesiologist  Anesthesia Plan Comments:        Anesthesia Quick Evaluation

## 2010-12-19 NOTE — H&P (View-Only) (Signed)
 [name]   12/03/2010 1:30 PM Office Visit  MRN: [id number]   Description: 40 year old female  Provider: Abshir Paolini M, MD  Department: [facility]        Diagnoses     Lobular carcinoma in situ of right breast   - Primary    233.0      Reason for Visit     Other    new pt- eval rt br LCIS        Vitals - Last Recorded       BP Pulse Temp(Src) Resp Ht Wt    132/88  72  98 F (36.7 C) (Temporal)  16  5' 7" (1.702 m)  198 lb 6.4 oz (89.994 kg)          BMI LMP            31.07 kg/m2  11/28/2010               Progress Notes     Amiylah Anastos M, MD  12/03/2010  2:11 PM  SignedChief Complaint   Patient presents with   .  Other       new pt- eval rt br LCIS      HPI [name] is a 40 y.o. female.     This healthy woman was referred to me by Dr. Elizabeth Brown admission at the [facility] for evaluation of an area in the lateral right breast which was biopsied recently and revealed lobular carcinoma in situ.   The patient does not have any breast symptoms. Has never had a breast problem in the past. Has no pain, mass, or nipple discharge.   She went for her first ever screening mammogram. This reveals some focal calcifications in the right breast laterally. This is reportedly about a 4 mm area. Image guided biopsy was ultimately done which reveals lobular carcinoma in situ. The E Cadherin stain is negative confirming the lobular phenotype. Homone receptors were done and there was strongly positive.   The family history is negative for breast cancer and is negative for ovarian cancer. One uncle had pancreatic cancer.HPI    Past Medical History   Diagnosis  Date   .  Breast calcification, right         Past Surgical History   Procedure  Date   .  Breast biopsy  11/12/10       right   .  Back surgery     .  Tubal ligation  2001       Family History   Problem  Relation  Age of Onset   .  Hypertension  Mother     .  Hyperlipidemia  Mother     .   Heart disease  Father         arrythmia   .  Hypertension  Father     .  Cancer  Maternal Uncle         pancreatic      Social History History   Substance Use Topics   .  Smoking status:  Former Smoker   .  Smokeless tobacco:  Not on file     Comment: quit 10 yrs   .  Alcohol Use:  Yes       Allergies   Allergen  Reactions   .  Augmentin         Current Outpatient Prescriptions   Medication  Sig  Dispense  Refill   .  ibuprofen (  ADVIL,MOTRIN) 200 MG tablet  Take 200 mg by mouth every 6 (six) hours as needed.           .  Pseudoephedrine HCl (SINUS DECONGESTANT PO)  Take by mouth daily.              Review of Systems Review of Systems  Constitutional: Negative.  Negative for fever, chills and unexpected weight change.  HENT: Positive for congestion. Negative for hearing loss, sore throat, trouble swallowing and voice change.   Eyes: Negative.  Negative for visual disturbance.  Respiratory: Positive for cough. Negative for apnea, chest tightness, shortness of breath and wheezing.   Cardiovascular: Negative.  Negative for chest pain, palpitations and leg swelling.  Gastrointestinal: Negative.  Negative for nausea, vomiting, abdominal pain, diarrhea, constipation, blood in stool, abdominal distention and anal bleeding.  Genitourinary: Negative.  Negative for hematuria, vaginal bleeding and difficulty urinating.  Musculoskeletal: Negative for arthralgias.  Skin: Negative for rash and wound.  Neurological: Positive for headaches. Negative for seizures and syncope.  Hematological: Negative.  Negative for adenopathy. Does not bruise/bleed easily.  Psychiatric/Behavioral: Negative.  Negative for confusion.    Blood pressure 132/88, pulse 72, temperature 98 F (36.7 C), temperature source Temporal, resp. rate 16, height 5' 7" (1.702 m), weight 198 lb 6.4 oz (89.994 kg).   Physical Exam Physical Exam  Constitutional: She is oriented to person, place, and time. She appears  well-developed and well-nourished. No distress.  HENT:   Head: Normocephalic and atraumatic.   Nose: Nose normal.   Mouth/Throat: No oropharyngeal exudate.  Eyes: Conjunctivae and EOM are normal. Pupils are equal, round, and reactive to light. Left eye exhibits no discharge. No scleral icterus.  Neck: Neck supple. No JVD present. No tracheal deviation present. No thyromegaly present.  Cardiovascular: Normal rate, regular rhythm, normal heart sounds and intact distal pulses.    No murmur heard. Pulmonary/Chest: Effort normal and breath sounds normal. No respiratory distress. She has no wheezes. She has no rales. She exhibits no tenderness.         Breasts moderately large.  Abdominal: Soft. Bowel sounds are normal. She exhibits no distension and no mass. There is no tenderness. There is no rebound and no guarding.  Musculoskeletal: She exhibits no edema and no tenderness.  Lymphadenopathy:    She has no cervical adenopathy.  Neurological: She is alert and oriented to person, place, and time. She exhibits normal muscle tone. Coordination normal.  Skin: Skin is warm. No rash noted. She is not diaphoretic. No erythema. No pallor.  Psychiatric: She has a normal mood and affect. Her behavior is normal. Judgment and thought content normal.    Data Reviewed I reviewed reviewed her mammograms, and the mammogram images and the pathology report as well as the breast diagnostic profile.   Assessment   Lobular carcinoma in situ right breast, 9 o'clock position, small focal area. ER/PR positive. Excisional biopsy is indicated to exclude occult invasive carcinoma.   Plan I had a long conversation with the patient and her husband about lobular carcinoma in situ. We discussed the differences between lobular carcinoma in situ and ductal carcinoma in situ as well as invasive cancer. She is aware that LCIS is a marker lesion for increased risk but does not require specific treatment other than  clarification of diagnosis. She seems to understand all of this fairly well.   We are going to schedule her for a right partial mastectomy with needle localization in the near future.     If this is all lobular carcinoma in situ, consideration will be given to referring her to a high risk breast clinic postop.   I've discussed indications and details of surgery with the patient and her husband. Risks and complications have been outlined, including but limited to bleeding, infection, reoperation for positive margins, cosmetic deformity, nerve damage with chronic pain or numbness, cardiac pulmonary and thromboembolic problems. She seems to understand these issues well. The tunnel for questions were answered. She is in full agreement with this plan.       Mylinda Brook M 12/03/2010, 2:01 PM                Not recorded             Patient Instructions     you will be scheduled for a right breast biopsy, also noted is a right partial mastectomy with needle localization. This will be done to remove the lobular carcinoma in situ and make sure that there is not any more advanced cancer present. We will decide about further plans after we get the final biopsy report back.Breast Biopsy WHY YOU NEED A BIOPSY  Your caregiver has recommended that you have a breast tissue sample taken (biopsy). This is done to be certain that the lump or abnormality found in your breast is not cancerous (malignant). During a biopsy, a small piece of tissue is removed, so it can be examined under a microscope by a specialist (pathologist) who looks at tissues and cells and diagnoses abnormalities in them. Most lumps (tumors) or abnormalities, on or in the breast, are not cancerous (benign). However, biopsies are taken when your caregiver cannot be absolutely certain of what is wrong only from doing a physical exam, mammogram (breast X-ray), or other studies. A breast biopsy can tell you whether nothing more  needs to be done, or you need more surgery or another type of treatment. A biopsy is done when there is: Any undiagnosed breast mass.   Nipple abnormalities, dimpling, crusting, or ulcerations.   Calcium deposits (calcifications) or abnormalities seen on your mammogram, ultrasound, or MRI.   Suspicious changes in the breast (thickening, asymmetry) seen on mammogram.   Abnormal discharge from the nipple, especially blood.   Redness, swelling, and pain of the breast.  HOW A BIOPSY IS PERFORMED A biopsy is often performed on an outpatient basis (you go home the same day). This can be done in a hospital, clinic, or surgical center. Tissue samples (biopsies) are often done under local anesthesia (area is numbed). Sometimes general anesthetics are required, in which case you sleep through the procedure. Biopsies may remove the entire lump, a small piece of the lump, or a small sliver of tissue removed by needle. TYPES OF BREAST BIOPSY Fine needle aspiration. A thin needle is placed through the skin, to the lump or cyst, and cells are removed.   Core needle biopsy. A large needle with a special tip is placed through the skin, to the abnormality, and a piece of tissue is removed.   Stereotactic biopsy. A core needle with a special X-ray is used, to direct the needle to the lump or abnormal area, which is difficult to feel or cannot be felt.   Vacuum-assisted biopsy. A hollow probe and a gentle vacuum remove a sample of tissue.   Ultrasound guided core needle biopsy. You lie on your stomach, with your breast through an opening, and a high frequency ultrasound helps guide the needle to the area of the abnormality.     Open biopsy. An incision is made in the breast, and a piece of the lump or the whole lump is removed.  LET YOUR CAREGIVER KNOW ABOUT: Allergies.   Medicines taken, including herbs, eye drops, over-the-counter medicines, and creams.   Use of steroids (by mouth or creams).   Previous problems  with anesthetics or Novocaine.   If you are taking aspirin or blood thinners.   Possibility of pregnancy, if this applies.   History of blood clots (thrombophlebitis).   History of bleeding or blood problems.   Previous surgery.   Other health problems.  RISKS AND COMPLICATIONS   Bleeding.   Infection.   Allergy to medicines.   Bruising and swelling of the breast.   Alteration in the shape of the breast.   Not finding the lump or abnormality.   Needing more surgery.  BEFORE THE PROCEDURE You should arrive 60 minutes prior to your procedure or as directed.   Check-in at the admissions desk, to fill out necessary forms, if you are not preregistered.   There will be consent forms to sign, prior to the procedure.   There is a waiting area for your family, while you are having your biopsy.   Try to have someone with you, to drive you home.   Do not smoke for 2 weeks before the surgery.   Let your caregiver know if you develop a cold or an infection.   Do not drink alcohol for at least 24 hours before surgery.   Wear a good support bra to the surgery.  AFTER THE PROCEDURE After surgery, you will be taken to the recovery area, where a nurse will watch and check your progress. Once you are awake, stable, and taking fluids well, if there are no other problems, you will be allowed to go home.   Ice packs applied to your operative site may help with discomfort and keep the swelling down.   You may resume normal diet and activities as directed. Avoid strenuous activities affecting the arm on the side of the biopsy, such as tennis, swimming, heavy lifting (more than 10 pounds) or pulling.   Bruising in the breast is normal following this procedure.   Wearing a support bra, even to bed, may be more comfortable. The bra will also help keep the dressing on.   Change dressings as directed.   Your doctor may apply a pressure dressing on your breast for 24 to 48 hours.   Only take over-the-counter or  prescription medicines for pain, discomfort, or fever as directed by your caregiver.   Do not take aspirin, because it can cause bleeding.  HOME CARE INSTRUCTIONS   You may resume your usual diet.   Have someone drive you home after the surgery.   Do not do any exercise, driving, lifting or general activities without your caregiver's permission.   Take medicines and over-the-counter medicines, as ordered by your caregiver.   Keep your postoperative appointments as recommended.   Do not drink alcohol while taking pain medicine.  Finding out the results of your test Not all test results are available during your visit. If your test results are not back during the visit, make an appointment with your caregiver to find out the results. Do not assume everything is normal if you have not heard from your caregiver or the medical facility. It is important for you to follow up on all of your test results.   SEEK MEDICAL CARE IF:   You notice redness,   swelling, or increasing pain in the wound.   You notice a bad smell coming from the wound or dressing.   You develop a rash.   You need stronger pain medicine.   You are having an allergic reaction or problems with your medicines.  SEEK IMMEDIATE MEDICAL CARE IF:   You have difficulty breathing.   You have a fever.   There is increased bleeding (more than a small spot) from the wound.   Pus is coming from the wound.   The wound is breaking open.  Document Released: 01/28/2005 Document Revised: 10/10/2010 Document Reviewed: 12/16/2008 Exit[employer] Patient Information 2012 [employer].        Patient Instructions History Recorded      Level of Service     PR OFFICE CONSULTATION,LEVEL III [99243]      Follow-up and Disposition     Return for schedule surgery.        All Flowsheet Templates (all recorded)     Encounter Vitals Flowsheet    Custom Formula Data Flowsheet    Anthropometrics Flowsheet               Referring Provider            Steven J Halm       All Charges for This Encounter       Code Description Service Date Service Provider Modifiers Quantity    99243 PR OFFICE CONSULTATION,LEVEL III 12/03/2010 Eimi Viney M Rayshon Albaugh, MD   1        Other Encounter Related Information     Allergies & Medications         Problem List         History         Patient-Entered Questionnaires     No data filed         

## 2010-12-19 NOTE — Anesthesia Procedure Notes (Addendum)
Performed by: Meyer Russel   Procedure Name: LMA Insertion Date/Time: 12/19/2010 3:35 PM Performed by: Meyer Russel Pre-anesthesia Checklist: Patient identified, Timeout performed, Emergency Drugs available, Suction available and Patient being monitored Patient Re-evaluated:Patient Re-evaluated prior to inductionOxygen Delivery Method: Circle System Utilized Preoxygenation: Pre-oxygenation with 100% oxygen Intubation Type: IV induction Ventilation: Mask ventilation without difficulty LMA: LMA flexible inserted LMA Size: 4.0 Number of attempts: 1 Placement Confirmation: positive ETCO2 and breath sounds checked- equal and bilateral Tube secured with: Tape Dental Injury: Teeth and Oropharynx as per pre-operative assessment

## 2010-12-19 NOTE — Op Note (Signed)
Lumpectomy OP Note  Preop diagnosis: Lobular carcinoma in situ right breast  Postop diagnosis: Lobular carcinoma in situ, right breast  Operation performed: Right partial mastectomy with needle localization  Surgeon:  Angelia Mould. Derrell Lolling, M.D.  Operative indications: This is a 40 year old Caucasian female who had a screening mammogram which revealed some focal calcifications in the lateral aspect of the right breast. This is a 4 mm area. Image guided biopsy showed lobular carcinoma in situ. Family history is negative for genetic cancers. She was evaluated as an outpatient. She is brought to the operating room electively for excision of this area to rule out invasive cancer.  Operative technique: The patient underwent wire localization at the breast center of [facility] by  Dr. Britta Mccreedy. The wire entered from the far lateral aspect of the breast and was directed medially with the lesion 5 cm away from the skin. The patient was then sent to the [facility]  day surgery center. Gen. Anesthesia was induced. Intravenous antibiotics were given. Surgical time out was held identifying correct patient correct procedure and correct site. 0.5% Marcaine with epinephrine was used as a local infiltration anesthetic.  A transverse radially oriented incisions was made in the lateral aspect of the right breast through the localizing wire insertion site. Dissection was carried down into the breast tissue and  widely around the localizing wire. The specimen was removed and marked with a 6 color margin marker kit. Specimen mammogram was performed and the radiologist stated that the area of concern had been removed. The specimen was sent to pathology. Hemostasis was adequate and achieved with electrocautery. The wound was irrigated with saline. The breast tissue was closed in several layers with interrupted sutures of 3-0 Vicryl and the skin closed with a running subcuticular suture of 4-0 Monocryl and Dermabond. An  ice pack  was placed. The patient was taken to recovery in stable condition. Estimated blood loss was about 15-20 cc. Complications none. Sponge needle and instrument counts were correct.  Angelia Mould. Derrell Lolling, M.D., [facility] 12/19/2010 4:37 PM

## 2010-12-19 NOTE — Interval H&P Note (Signed)
History and Physical Interval Note:   12/19/2010   3:18 PM   [name]  has presented today for surgery, with the diagnosis of right lobular carcinoma in situ  The various methods of treatment have been discussed with the patient and family. After consideration of risks, benefits and other options for treatment, the patient has consented to  Procedure(s): MASTECTOMY PARTIAL as a surgical intervention .  The patients' history has been reviewed, patient examined, no change in status, stable for surgery.  I have reviewed the patients' chart and labs.  Questions were answered to the patient's satisfaction.     Ernestene Mention  MD

## 2010-12-19 NOTE — Anesthesia Postprocedure Evaluation (Signed)
  Anesthesia Post-op Note  Patient: [name]  Procedure(s) Performed:  MASTECTOMY PARTIAL - right partial mastectomy and needle localization  Patient Location: PACU  Anesthesia Type: General  Level of Consciousness: awake, alert  and oriented  Airway and Oxygen Therapy: Patient Spontanous Breathing  Post-op Pain: mild  Post-op Assessment: Post-op Vital signs reviewed, Patient's Cardiovascular Status Stable, Respiratory Function Stable, Patent Airway, No signs of Nausea or vomiting, Adequate PO intake and Pain level controlled  Post-op Vital Signs: stable  Complications: No apparent anesthesia complications

## 2010-12-20 ENCOUNTER — Other Ambulatory Visit (INDEPENDENT_AMBULATORY_CARE_PROVIDER_SITE_OTHER): Payer: Self-pay | Admitting: General Surgery

## 2010-12-20 ENCOUNTER — Telehealth (INDEPENDENT_AMBULATORY_CARE_PROVIDER_SITE_OTHER): Payer: Self-pay

## 2010-12-20 NOTE — Telephone Encounter (Signed)
Unable to reach pt po. LMOM with appt date 11-26 arrive at 4:45.

## 2010-12-21 ENCOUNTER — Telehealth (INDEPENDENT_AMBULATORY_CARE_PROVIDER_SITE_OTHER): Payer: Self-pay

## 2010-12-21 NOTE — Telephone Encounter (Signed)
Pt notified of path result and given po appt.

## 2011-01-01 ENCOUNTER — Encounter (HOSPITAL_BASED_OUTPATIENT_CLINIC_OR_DEPARTMENT_OTHER): Payer: Self-pay | Admitting: General Surgery

## 2011-01-07 ENCOUNTER — Telehealth (INDEPENDENT_AMBULATORY_CARE_PROVIDER_SITE_OTHER): Payer: Self-pay | Admitting: General Surgery

## 2011-01-07 ENCOUNTER — Encounter (INDEPENDENT_AMBULATORY_CARE_PROVIDER_SITE_OTHER): Payer: BC Managed Care – PPO | Admitting: General Surgery

## 2011-01-14 ENCOUNTER — Other Ambulatory Visit (INDEPENDENT_AMBULATORY_CARE_PROVIDER_SITE_OTHER): Payer: Self-pay

## 2011-01-14 ENCOUNTER — Ambulatory Visit (INDEPENDENT_AMBULATORY_CARE_PROVIDER_SITE_OTHER): Payer: BC Managed Care – PPO | Admitting: General Surgery

## 2011-01-14 ENCOUNTER — Encounter (INDEPENDENT_AMBULATORY_CARE_PROVIDER_SITE_OTHER): Payer: Self-pay | Admitting: General Surgery

## 2011-01-14 VITALS — BP 112/74 | HR 60 | Temp 97.9°F | Resp 18 | Ht 67.0 in | Wt 199.4 lb

## 2011-01-14 DIAGNOSIS — D0590 Unspecified type of carcinoma in situ of unspecified breast: Secondary | ICD-10-CM

## 2011-01-14 DIAGNOSIS — D0501 Lobular carcinoma in situ of right breast: Secondary | ICD-10-CM

## 2011-01-14 NOTE — Patient Instructions (Signed)
Your final pathology report showed no residual lobular carcinoma in situ. You do not need any further surgery and you do not need to be treated for breast cancer. Lobular carcinoma in situ is simply as a marker for increased risk of breast cancer in both breasts for the future.  I advise you do get a mammogram every year and to have a breast exam in one year. I will offer to see you in one year for the breast exam and review of your mammograms.  You will also be referred to the high risk breast clinic at the [facility].

## 2011-01-14 NOTE — Progress Notes (Addendum)
Subjective:     Patient ID: [name], female   DOB: 1970/04/21, 40 y.o.   MRN: [id number]  HPI This patient underwent image guided biopsy which showed lobular carcinoma in situ in the lateral right breast. Family history is negative for breast cancer or ovarian cancer.  On December 19, 2010 she underwent right partial mastectomy with needle localization. Final pathology shows no residual lobular carcinoma in situ, so this was a small area. She has no complaints about her wound. She's had no signs of infection. She has no pain. She notices no cosmetic deformity.  I have discussed her pathology with her and told her that this is a marker lesion for increased risk of breast cancer in the future and that she should be followed closely from here on out. I have also advised referral to the high risk clinic at the common  Review of Systems     Objective:   Physical Exam Patient is well and in good spirits  Right breast is examined. Incision is healing nicely. There is no sign of any fluid collection or hematoma. Cosmetic result is excellent.    Assessment:     Lobular carcinoma in situ right breast, lateral quadrant, less than 5 mm area, completely excised.  Negative family history for breast or ovarian cancer.  ADDENDUM:  BRCA 1/2 negative.  BART negative (07/12/2011)    Plan:     She is advised to have annual mammography and annual breast exam yearly. I will see her back in one year for this.  I told her that ultimately I will transfer this  surveillance care to her primary care physician or her gynecologist.  She'll be referred to Dr. Drue Second at the [facility] high risk breast clinic.

## 2011-01-18 ENCOUNTER — Telehealth: Payer: Self-pay | Admitting: *Deleted

## 2011-01-18 NOTE — Telephone Encounter (Signed)
Confirmed 02/01/11 appt w/ pt.  Mailed before appt letter & packet to pt.  Gave info to [employer] in med rec for chart.

## 2011-01-31 ENCOUNTER — Other Ambulatory Visit: Payer: Self-pay | Admitting: *Deleted

## 2011-01-31 DIAGNOSIS — D0500 Lobular carcinoma in situ of unspecified breast: Secondary | ICD-10-CM

## 2011-02-01 ENCOUNTER — Ambulatory Visit (HOSPITAL_BASED_OUTPATIENT_CLINIC_OR_DEPARTMENT_OTHER): Payer: BC Managed Care – PPO | Admitting: Oncology

## 2011-02-01 ENCOUNTER — Ambulatory Visit (HOSPITAL_BASED_OUTPATIENT_CLINIC_OR_DEPARTMENT_OTHER): Payer: BC Managed Care – PPO

## 2011-02-01 ENCOUNTER — Other Ambulatory Visit (HOSPITAL_BASED_OUTPATIENT_CLINIC_OR_DEPARTMENT_OTHER): Payer: BC Managed Care – PPO | Admitting: Lab

## 2011-02-01 DIAGNOSIS — D0500 Lobular carcinoma in situ of unspecified breast: Secondary | ICD-10-CM

## 2011-02-01 DIAGNOSIS — Z170 Estrogen receptor positive status [ER+]: Secondary | ICD-10-CM

## 2011-02-01 DIAGNOSIS — D0590 Unspecified type of carcinoma in situ of unspecified breast: Secondary | ICD-10-CM

## 2011-02-01 LAB — CBC WITH DIFFERENTIAL/PLATELET
Eosinophils Absolute: 0.2 10*3/uL (ref 0.0–0.5)
LYMPH%: 30.9 % (ref 14.0–49.7)
MCHC: 34.4 g/dL (ref 31.5–36.0)
MCV: 87.8 fL (ref 79.5–101.0)
MONO%: 5.9 % (ref 0.0–14.0)
NEUT#: 4.2 10*3/uL (ref 1.5–6.5)
Platelets: 215 10*3/uL (ref 145–400)
RBC: 4.59 10*6/uL (ref 3.70–5.45)

## 2011-02-01 LAB — COMPREHENSIVE METABOLIC PANEL
Alkaline Phosphatase: 61 U/L (ref 39–117)
Glucose, Bld: 118 mg/dL — ABNORMAL HIGH (ref 70–99)
Sodium: 139 mEq/L (ref 135–145)
Total Bilirubin: 0.6 mg/dL (ref 0.3–1.2)
Total Protein: 6.8 g/dL (ref 6.0–8.3)

## 2011-02-01 MED ORDER — TAMOXIFEN CITRATE 20 MG PO TABS: 20.0000 mg | ORAL_TABLET | Freq: Every day | ORAL | Status: AC

## 2011-02-01 NOTE — Patient Instructions (Signed)

## 2011-02-02 ENCOUNTER — Telehealth: Payer: Self-pay | Admitting: *Deleted

## 2011-02-02 NOTE — Telephone Encounter (Signed)
Gave patient three month appointment for 04-2011

## 2011-02-04 ENCOUNTER — Encounter: Payer: Self-pay | Admitting: *Deleted

## 2011-02-04 NOTE — Progress Notes (Signed)
Mailed after appt letter to pt. 

## 2011-02-06 NOTE — Progress Notes (Signed)
[name] [id number] April 29, 1970 40 y.o. 02/06/2011 12:49 PM  CC Dr. Claud Kelp  REASON FOR CONSULTATION:  40 year old female with recent diagnosis of lobular carcinoma in situ in the lateral right breast. She is status post right partial mastectomy with needle localization on 12/19/2010.   REFERRING PHYSICIAN: Dr. Claud Kelp  HISTORY OF PRESENT ILLNESS:  [name] is a 40 y.o. female who is seen in the high-risk clinic for discussion of her new diagnosis of lobular carcinoma in situ of the right breast. She is status post partial mastectomy of the right breast with new needle localization. Her final pathology revealed no residual lobular carcinoma in situ so it was a small area. The tumor was ER positive. Postoperatively patient is doing well without any significant complaints. She denies any fevers chills night sweats headaches shortness of breath chest pains palpitations no myalgias or arthralgias. Her surgical site is healing quite well. Remainder of the 14 point review of systems is negative.   Past Medical History  Diagnosis Date  . Cancer     right lobular carcinoma in situ    Past Surgical History  Procedure Date  . Tubal ligation 2001  . Back surgery 10/2002    lumbar hemilaminectomy, microdiscectomy L5-S1  . Mastectomy, partial 12/19/2010    Procedure: MASTECTOMY PARTIAL;  Surgeon: Ernestene Mention, MD;  Location: [facility];  Service: General;  Laterality: Right;  right partial mastectomy and needle localization  . Breast surgery     Family History  Problem Relation Age of Onset  . Hypertension Mother   . Hyperlipidemia Mother   . Heart disease Father     arrythmia  . Hypertension Father   . Cancer Maternal Uncle     pancreatic    Social History History  Substance Use Topics  . Smoking status: Former Games developer  . Smokeless tobacco: Never Used   Comment: quit 10 yrs  . Alcohol Use: Yes     occasionally    Allergies  Allergen  Reactions  . Augmentin Hives    Current Outpatient Prescriptions  Medication Sig Dispense Refill  . ibuprofen (ADVIL,MOTRIN) 200 MG tablet Take 200 mg by mouth every 6 (six) hours as needed.        . tamoxifen (NOLVADEX) 20 MG tablet Take 1 tablet (20 mg total) by mouth daily.  30 tablet  12    REVIEW OF SYSTEMS:  Pertinent items are noted in HPI.  ECOG performance Status:0  PHYSICAL EXAMINATION: Blood pressure 114/76, pulse 82, temperature 98.6 F (37 C), height 5\' 7"  (1.702 m), weight 200 lb 11.2 oz (91.037 kg). XLK:GMWNU, healthy, no distress, well nourished and well developed SKIN: skin color, texture, turgor are normal, no rashes or significant lesions HEAD: Normocephalic, No masses, lesions, tenderness or abnormalities EYES: normal, PERRLA, EOMI EARS: External ears normal OROPHARYNX:no exudate, no erythema, lips, buccal mucosa, and tongue normal and dentition normal  NECK: supple, no adenopathy, no bruits, no JVD, thyroid normal size, non-tender, without nodularity LYMPH:  no palpable lymphadenopathy, no hepatosplenomegaly BREAST:left breast normal without mass, skin or nipple changes or axillary nodes, surgical scars noted in the right breast without any other skin changes. LUNGS: clear to auscultation , and palpation, clear to auscultation and percussion HEART: regular rate & rhythm, no murmurs, no gallops, S1 normal and S2 normal ABDOMEN:abdomen soft, non-tender, normal bowel sounds and no masses or organomegaly BACK: Back symmetric, no curvature., No CVA tenderness, Range of motion is normal EXTREMITIES:less then 2 second  capillary refill, no joint deformities, effusion, or inflammation, no edema, no clubbing, no cyanosis  NEURO: alert & oriented x 3 with fluent speech, no focal motor/sensory deficits, gait normal, reflexes normal and symmetric    STUDIES/RESULTS: No results found.    PATHOLOGY: 11/19/2010 needle core biopsy of the lateral right breast revealed a  mammary carcinoma in situ with microcalcification ER +99% PR +83% or it was a lobular carcinoma in situ. 12/20/2010 partial mastectomy of the right breast reveals scar tissue consistent with biopsy site no residual lobular carcinoma in situ was present fibrocystic changes were noted there was no hyperplasia atypia or malignancy identified.    ASSESSMENT    40 year old female with lobular carcinoma in situ she is status post right breast lumpectomy without any further evidence of disease or invasive cancer. So patient really has and LCIS lesion. Patient is seen in the high-risk clinic for discussion of risk reduction. As well as genetic counseling and testing.    PLAN:    The following recommendations were made to the patient.  #1 chemoprevention with an antiestrogen such as tamoxifen since she is premenopausal. Recommendation is 20 mg daily for 5 years. Risks and benefits and rationale for this were discussed with the patient.  #2 we discussed weight reduction and exercise as part of risk reducing strategy.  #3 diet was also discussed I have recommended patient add more fruits and vegetables and fiber in her diet and reduce red meat. I would also recommend increasing physician take as well his white meat.  #4 patient does drink alcohol and reduction in alcohol consumption was recommended. Patient is not a smoker and this certainly will benefit her in the long run  #5 we did discuss genetic counseling and testing. We specifically talked about the BRCA1 and 2 gene mutation analysis. Patient does not have a family history of any type of malignancies including breast or ovarian. However due to her young age and having a high risk lesion I would recommend that she be seen by our genetic counselors. Patient has agreed to this an appointment has been set up for her.  #6 patient will continue to have mammography on a yearly basis. She at this time would not be getting any MRIs however if she is found  to be a carrier for BRCA1 or 2 then she certainly would buy NCC on guidelines qualify for yearly MRI as well.  #7 patient and I discussed self breast examinations as well as ongoing need for clinical breast examinations by her primary care provider or a gynecologist.  #8 I will see her back in about 3 months time for followup. She of course can call me sooner if she needs to be seen sooner.     All questions were answered. The patient knows to call the clinic with any problems, questions or concerns. We can certainly see the patient much sooner if necessary.  Thank you so much for allowing me to participate in the care of [name]. I will continue to follow up the patient with you and assist in her care.  I spent 40 minutes counseling the patient face to face. The total time spent in the appointment was 60 minutes. Drue Second, MD Medical/Oncology [facility] [phone] (beeper) [phone] (Office)  02/06/2011, 12:49 PM 02/06/2011, 12:49 PM

## 2011-02-13 ENCOUNTER — Encounter: Payer: Self-pay | Admitting: Radiation Oncology

## 2011-02-13 ENCOUNTER — Ambulatory Visit
Admission: RE | Admit: 2011-02-13 | Discharge: 2011-02-13 | Disposition: A | Discharge status: Home / Self Care | Payer: BC Managed Care – PPO | Source: Ambulatory Visit | Attending: Radiation Oncology | Admitting: Radiation Oncology

## 2011-02-13 VITALS — BP 105/69 | HR 59 | Temp 97.6°F | Ht 67.0 in | Wt 201.1 lb

## 2011-02-13 DIAGNOSIS — D0501 Lobular carcinoma in situ of right breast: Secondary | ICD-10-CM

## 2011-02-13 DIAGNOSIS — Z87891 Personal history of nicotine dependence: Secondary | ICD-10-CM | POA: Insufficient documentation

## 2011-02-13 DIAGNOSIS — Z800 Family history of malignant neoplasm of digestive organs: Secondary | ICD-10-CM | POA: Insufficient documentation

## 2011-02-13 DIAGNOSIS — D0590 Unspecified type of carcinoma in situ of unspecified breast: Secondary | ICD-10-CM | POA: Insufficient documentation

## 2011-02-13 HISTORY — DX: Gastroparesis: K31.84

## 2011-02-13 HISTORY — DX: Malignant neoplasm of unspecified site of unspecified female breast: C50.919

## 2011-02-13 NOTE — Progress Notes (Signed)
Please see the Nurse Progress Note in the MD Initial Consult Encounter for this patient. 

## 2011-02-13 NOTE — Progress Notes (Signed)
CC:   Drue Second, M.D. Angelia Mould. Derrell Lolling, M.D. Maxie Better, M.D.  REFERRING PHYSICIAN:  Drue Second, M.D.  DIAGNOSIS:  Lobular carcinoma in situ of the right breast.  HISTORY OF PRESENT ILLNESS:  [name] is a very pleasant 41 year old female who is seen out of the courtesy of Dr. Welton Flakes for an opinion concerning radiation therapy as part of management of the patient's recently diagnosed right breast cancer.  Last year, on routine screening mammography, [name] was noted to have some calcifications within the lateral aspect of her right breast.  She proceeded to undergo biopsy of this area on 11/19/2010.  Upon pathologic review, the patient was found have mammary carcinoma in situ.  E-cadherin stain was performed which showed loss of the E-cadherin expression, confirming lobular origin for the patient's in situ malignancy.  The patient was subsequently seen by Dr. Claud Kelp, and on 12/20/2010, the patient underwent a right lumpectomy.  The patient was found to have scar tissue consistent with biopsy results, but no residual lobular carcinoma in situ or invasive malignancy recovered.  The patient's tumor was estrogen receptor positive at 99%, and progesterone receptor positive at 83%.  In light of these findings, the patient was seen by Dr. Welton Flakes in consultation.  It was recommended the patient proceed with hormonal therapy (tamoxifen). The patient is now seen in Radiation Oncology for evaluation and consideration for treatment.  ALLERGIES:  The patient has an allergy to Augmentin.  MEDICATIONS:  The patient is on no prescription medications as of yet, but will start tamoxifen 20 mg daily in the near future.  PAST SURGICAL HISTORY:  Prior surgeries include a tubal ligation in 2001.  The patient had a lumbar hemilaminectomy and microdiskectomy in 2004.  SOCIAL HISTORY:  The patient is married and lives in the [place] area with her husband.  The patient is a  Retail buyer.  The patient has a remote history of tobacco use.  The patient has occasional alcohol intake.  FAMILY HISTORY:  Significant for a maternal uncle with a history of pancreatic cancer.  There is no family history of breast or ovarian cancer.  History of hypertension in the patient's mother and father.  REVIEW OF SYSTEMS:  The patient has some mild discomfort in the right breast area.  She has also noticed some numbness along the lumpectomy scar.  The patient denies any new bony pain, headaches, dizziness or blurred vision.  The patient denies any problems with swelling in her right arm or hand.  A complete review of systems is undertaken with the patient by myself and reviewed in the electronic medical record.  There are no other review of system issues noted.  PHYSICAL EXAMINATION:  General:  This is a very pleasant, young, healthy- appearing, 41 year old female in no acute stress.  Vital Signs: Temperature is 97.6, pulse is 59, blood pressure is 105/69, weight is 201 pounds, height 5 feet 7 inches.  Examination of the pupils reveals them to be equal, round and reactive to light.  The extraocular eye movements are intact.  The tongue is midline.  There is no secondary infection noted in the oral cavity or posterior pharynx.  The patient's teeth are in good repair.  Examination of the neck and supraclavicular region reveals no evidence of adenopathy.  The axillary areas are free of adenopathy.  Examination of the lungs reveals them to be clear.  The heart has a regular rhythm and rate.  Examination of the left breast reveals no  mass or nipple discharge.  Examination of the right breast reveals a horizontal scar in approximately the 9 o'clock position in the breast.  This is well healed without signs of drainage or infection. There is no dominant mass appreciated in the breast, nipple discharge or bleeding.  Examination of the abdomen reveals it to be soft  and nontender with normal bowel sounds.  There is no obvious hepatosplenomegaly.  On neurological examination, motor strength is 5/5 in the proximal and distal muscle groups of the upper and lower extremities.  Peripheral pulses are good.  There is no cyanosis or clubbing noted in the extremities.  LABORATORY DATA:  From November 7th:  White count 6.8, hemoglobin 15.5, hematocrit 44.4, BUN 12, creatinine 0.89, glucose 95.  X-RAY STUDIES:  As summarized in the HPI.  IMPRESSION AND PLAN:  Lobular carcinoma in situ of the right breast. The patient has undergone a complete resection of the area of concern. There is no mammary intraductal or invasive malignancy noted in the pathology report.  Given the above diagnosis, I would do not feel the patient would benefit from radiation therapy.  Radiation therapy has typically not been beneficial for patients with lobular carcinoma in situ in terms reduced recurrence within the breast.  The patient is at risk for additional recurrence in the involved breast, as well as the contralateral breast.  The patient will, as above, proceed with hormonal therapy and close followup with Dr. Welton Flakes and Dr. Derrell Lolling.    ______________________________ Billie Lade, Ph.D., M.D. JDK/MEDQ  D:  02/13/2011  T:  02/13/2011  Job:  [id number]

## 2011-02-15 ENCOUNTER — Encounter: Payer: Self-pay | Admitting: *Deleted

## 2011-02-15 NOTE — Progress Notes (Signed)
[facility] Distress Screening  CSW was referred by distress screening protocol. Pt scored a 5 on DT. CSW unable to meet with pt in exam room, therefore, CSW called pt at home.  After attempts to reach the pt CSW left a message offering support and information on support services at [facility].  CSW encouraged pt to contact any of the support team members with questions or concerns.    Abigail Elmore, LCSW 

## 2011-04-16 ENCOUNTER — Ambulatory Visit: Payer: BC Managed Care – PPO | Admitting: Oncology

## 2011-04-16 ENCOUNTER — Other Ambulatory Visit: Payer: Self-pay | Admitting: *Deleted

## 2011-04-16 ENCOUNTER — Other Ambulatory Visit: Payer: BC Managed Care – PPO | Admitting: Lab

## 2011-04-16 NOTE — Telephone Encounter (Signed)
This RN left message on identified VM for pt for return call to follow up on missed appt for 3/5. Request for refill on Tamoxifen received from pharmacy as well and per review with MD need to talk with pt and get appointment rescheduled to refill medication.

## 2011-04-17 ENCOUNTER — Other Ambulatory Visit: Payer: Self-pay | Admitting: *Deleted

## 2011-04-17 ENCOUNTER — Encounter: Payer: Self-pay | Admitting: *Deleted

## 2011-04-17 ENCOUNTER — Telehealth: Payer: Self-pay | Admitting: Oncology

## 2011-04-17 MED ORDER — TAMOXIFEN CITRATE 20 MG PO TABS: 20.0000 mg | ORAL_TABLET | Freq: Every day | ORAL | Status: AC

## 2011-04-17 NOTE — Telephone Encounter (Signed)
3/8 appt r/s to 3/5...the patient was a no show for 3/5 and may not have been informed. lmonvm for pt re 3/8 appt being cx'd and that she will be contacted with a new d/t. Pt also given my name and direct number to call me back.

## 2011-04-19 ENCOUNTER — Ambulatory Visit: Payer: BC Managed Care – PPO | Admitting: Oncology

## 2011-04-19 ENCOUNTER — Other Ambulatory Visit: Payer: BC Managed Care – PPO | Admitting: Lab

## 2011-04-29 ENCOUNTER — Telehealth: Payer: Self-pay | Admitting: *Deleted

## 2011-04-29 NOTE — Telephone Encounter (Signed)
patient called in and request the date and time of the reschedule appointment gave patient the information over the phone

## 2011-05-22 ENCOUNTER — Other Ambulatory Visit: Payer: Self-pay | Admitting: *Deleted

## 2011-05-22 DIAGNOSIS — C50919 Malignant neoplasm of unspecified site of unspecified female breast: Secondary | ICD-10-CM

## 2011-05-22 MED ORDER — TAMOXIFEN CITRATE 20 MG PO TABS: 20.0000 mg | ORAL_TABLET | Freq: Every day | ORAL | Status: DC

## 2011-06-17 ENCOUNTER — Telehealth: Payer: Self-pay | Admitting: Oncology

## 2011-06-17 ENCOUNTER — Other Ambulatory Visit (HOSPITAL_BASED_OUTPATIENT_CLINIC_OR_DEPARTMENT_OTHER): Payer: BC Managed Care – PPO | Admitting: Lab

## 2011-06-17 ENCOUNTER — Ambulatory Visit (HOSPITAL_BASED_OUTPATIENT_CLINIC_OR_DEPARTMENT_OTHER): Payer: BC Managed Care – PPO | Admitting: Oncology

## 2011-06-17 ENCOUNTER — Encounter: Payer: Self-pay | Admitting: Oncology

## 2011-06-17 VITALS — BP 117/71 | HR 70 | Temp 98.9°F | Ht 67.0 in | Wt 191.4 lb

## 2011-06-17 DIAGNOSIS — D0590 Unspecified type of carcinoma in situ of unspecified breast: Secondary | ICD-10-CM

## 2011-06-17 DIAGNOSIS — D0501 Lobular carcinoma in situ of right breast: Secondary | ICD-10-CM

## 2011-06-17 LAB — COMPREHENSIVE METABOLIC PANEL
ALT: 20 U/L (ref 0–35)
AST: 22 U/L (ref 0–37)
CO2: 25 mEq/L (ref 19–32)
Calcium: 9.1 mg/dL (ref 8.4–10.5)
Chloride: 106 mEq/L (ref 96–112)
Sodium: 139 mEq/L (ref 135–145)
Total Bilirubin: 0.5 mg/dL (ref 0.3–1.2)
Total Protein: 6.8 g/dL (ref 6.0–8.3)

## 2011-06-17 LAB — CBC WITH DIFFERENTIAL/PLATELET
Eosinophils Absolute: 0.1 10*3/uL (ref 0.0–0.5)
MONO#: 0.3 10*3/uL (ref 0.1–0.9)
NEUT#: 4.1 10*3/uL (ref 1.5–6.5)
RBC: 4.55 10*6/uL (ref 3.70–5.45)
RDW: 12.3 % (ref 11.2–14.5)
WBC: 6.5 10*3/uL (ref 3.9–10.3)
nRBC: 0 % (ref 0–0)

## 2011-06-17 MED ORDER — TAMOXIFEN CITRATE 20 MG PO TABS: 20.0000 mg | ORAL_TABLET | Freq: Every day | ORAL | Status: AC

## 2011-06-17 NOTE — Telephone Encounter (Signed)
Pt has her may,nov 2013 appts and the genetics testing appt

## 2011-06-17 NOTE — Progress Notes (Signed)
CLAUDEEN LEASON [id number] 04/28/1970 41 y.o. 06/17/2011 12:22 PM  CC Dr. Claud Kelp  INTERVAL HISTORY: Patient is seen in followup today overall she seems to be doing well she is tolerating the tamoxifen well. She today denies any fevers chills night sweats headaches shortness of breath chest pains palpitations no myalgias or arthralgias no swelling swelling in her lower extremities. She has not noticed any vaginal bleeding or discharge. Remainder of the 10 point review of systems is negative.  Past Medical History  Diagnosis Date  . Breast cancer   . Cancer     right lobular carcinoma in situ  . Gastroparesis     controlled    Past Surgical History  Procedure Date  . Tubal ligation 2001  . Back surgery 10/2002    lumbar hemilaminectomy, microdiscectomy L5-S1  . Mastectomy, partial 12/19/2010    Procedure: MASTECTOMY PARTIAL;  Surgeon: Ernestene Mention, MD;  Location: [facility];  Service: General;  Laterality: Right;  right partial mastectomy and needle localization  . Breast surgery     Family History  Problem Relation Age of Onset  . Hypertension Mother   . Hyperlipidemia Mother   . Heart disease Father     arrythmia  . Hypertension Father   . Cancer Maternal Uncle     pancreatic    Social History History  Substance Use Topics  . Smoking status: Former Smoker    Quit date: 08/12/2000  . Smokeless tobacco: Never Used   Comment: quit 10 yrs  . Alcohol Use: Yes     occasionally    Allergies  Allergen Reactions  . Amoxicillin-Pot Clavulanate Hives    Current Outpatient Prescriptions  Medication Sig Dispense Refill  . ibuprofen (ADVIL,MOTRIN) 200 MG tablet Take 200 mg by mouth every 6 (six) hours as needed.        . tamoxifen (NOLVADEX) 20 MG tablet Take 1 tablet (20 mg total) by mouth daily.  90 tablet  3    REVIEW OF SYSTEMS:  Pertinent items are noted in HPI.  ECOG performance Status:0  PHYSICAL EXAMINATION: Blood pressure 117/71, pulse  70, temperature 98.9 F (37.2 C), temperature source Oral, height 5\' 7"  (1.702 m), weight 191 lb 6.4 oz (86.818 kg). WUJ:WJXBJ, healthy, no distress, well nourished and well developed SKIN: skin color, texture, turgor are normal, no rashes or significant lesions HEAD: Normocephalic, No masses, lesions, tenderness or abnormalities EYES: normal, PERRLA, EOMI EARS: External ears normal OROPHARYNX:no exudate, no erythema, lips, buccal mucosa, and tongue normal and dentition normal  NECK: supple, no adenopathy, no bruits, no JVD, thyroid normal size, non-tender, without nodularity LYMPH:  no palpable lymphadenopathy, no hepatosplenomegaly BREAST:left breast normal without mass, skin or nipple changes or axillary nodes, surgical scars noted in the right breast without any other skin changes. LUNGS: clear to auscultation , and palpation, clear to auscultation and percussion HEART: regular rate & rhythm, no murmurs, no gallops, S1 normal and S2 normal ABDOMEN:abdomen soft, non-tender, normal bowel sounds and no masses or organomegaly BACK: Back symmetric, no curvature., No CVA tenderness, Range of motion is normal EXTREMITIES:less then 2 second capillary refill, no joint deformities, effusion, or inflammation, no edema, no clubbing, no cyanosis  NEURO: alert & oriented x 3 with fluent speech, no focal motor/sensory deficits, gait normal, reflexes normal and symmetric    STUDIES/RESULTS: No results found.    PATHOLOGY: 11/19/2010 needle core biopsy of the lateral right breast revealed a mammary carcinoma in situ with microcalcification ER +99% PR +  83% or it was a lobular carcinoma in situ. 12/20/2010 partial mastectomy of the right breast reveals scar tissue consistent with biopsy site no residual lobular carcinoma in situ was present fibrocystic changes were noted there was no hyperplasia atypia or malignancy identified.    ASSESSMENT    41 year old female with lobular carcinoma in situ she  is status post right breast lumpectomy without any further evidence of disease or invasive cancer. So patient really has and LCIS lesion. Patient is now on tamoxifen 20 mg daily and she is tolerating it quite well.   PLAN:  #1 we will continue the tamoxifen.  #2 patient is requesting to be seen by our genetic counselor and I have made a referral.  #3 she will be seen back in 6 months time in followup.  All questions were answered. The patient knows to call the clinic with any problems, questions or concerns. We can certainly see the patient much sooner if necessary.  Thank you so much for allowing me to participate in the care of Kenlea ELVIS BOOT. I will continue to follow up the patient with you and assist in her care.  I spent 25 minutes counseling the patient face to face. The total time spent in the appointment was 30 minutes.  Drue Second, MD Medical/Oncology [facility] [phone] (beeper) [phone] (Office)  06/17/2011, 12:22 PM 06/17/2011, 12:22 PM

## 2011-06-17 NOTE — Patient Instructions (Signed)
1. I will see you back in 6 months  2. Refer to [employer]

## 2011-06-24 ENCOUNTER — Ambulatory Visit (HOSPITAL_BASED_OUTPATIENT_CLINIC_OR_DEPARTMENT_OTHER): Payer: BC Managed Care – PPO | Admitting: Genetic Counselor

## 2011-06-24 ENCOUNTER — Other Ambulatory Visit: Payer: BC Managed Care – PPO | Admitting: Lab

## 2011-06-24 DIAGNOSIS — D0501 Lobular carcinoma in situ of right breast: Secondary | ICD-10-CM

## 2011-06-24 DIAGNOSIS — Z800 Family history of malignant neoplasm of digestive organs: Secondary | ICD-10-CM

## 2011-06-24 DIAGNOSIS — D0590 Unspecified type of carcinoma in situ of unspecified breast: Secondary | ICD-10-CM

## 2011-06-24 NOTE — Progress Notes (Signed)
Dr. Welton Flakes requested a consultation for genetic counseling and risk assessment for [name], a 41 y.o. female, for discussion of her personal history of breast cancer. She presents to clinic today to discuss the possibility of a genetic predisposition to cancer, and to further clarify her risks, as well as hdf family members' risks for cancer.   HISTORY OF PRESENT ILLNESS: In September 2012, at the age of 39, [name] was diagnosed with lobular carcinoma in situ breast cancer. This was treated with surgery in November, 2012.       Past Medical History  Diagnosis Date  . Breast cancer   . Cancer     right lobular carcinoma in situ  . Gastroparesis     controlled    Past Surgical History  Procedure Date  . Tubal ligation 2001  . Back surgery 10/2002    lumbar hemilaminectomy, microdiscectomy L5-S1  . Mastectomy, partial 12/19/2010    Procedure: MASTECTOMY PARTIAL;  Surgeon: Ernestene Mention, MD;  Location: [facility];  Service: General;  Laterality: Right;  right partial mastectomy and needle localization  . Breast surgery     History  Substance Use Topics  . Smoking status: Former Smoker    Quit date: 08/12/2000  . Smokeless tobacco: Never Used   Comment: quit 10 yrs  . Alcohol Use: Yes     occasionally    REPRODUCTIVE HISTORY AND PERSONAL RISK ASSESSMENT FACTORS: Menarche was at age 88 .   Premenopausal Uterus Intact: Yes Ovaries Intact: Yes G2P2A0 , first live birth at age 53  She has not previously undergone treatment for infertility.   Never used OCPs   She has not used HRT in the past.    FAMILY HISTORY:  We obtained a detailed, 4-generation family history.  Significant diagnoses are listed below: Family History  Problem Relation Age of Onset  . Hypertension Mother   . Hyperlipidemia Mother   . Heart disease Father     arrythmia  . Hypertension Father   . Cancer Maternal Uncle     pancreatic  The patient was diagnosed with lobular  carcinoma in situ breast cancer at age 24.  She has a paternal uncle who was diagnosed with pancreatic cancer and died between age 27-65.  There is no other reported cancer history on either side of her family.   Patient's maternal ancestors are of [place] descent, and paternal ancestors are of English descent. There is no reported Ashkenazi Jewish ancestry. There is no  known consanguinity.  GENETIC COUNSELING RISK ASSESSMENT, DISCUSSION, AND SUGGESTED FOLLOW UP: We reviewed the natural history and genetic etiology of sporadic, familial and hereditary cancer syndromes. About 5-10% of breast cancer is hereditary, and of this, about 85% is the result of BRCA1 or BRCA2 mutations.  We reviewed the red flags of hereditary cancer syndromes and the dominant inheritance pattern.  The patient's breast cancer is suggestive of the following possible diagnosis: hereditary breast cancer  We discussed that identification of a hereditary cancer syndrome may help her care providers tailor the patients medical management. If a mutation indicating a hereditary cacner is detected in this case, the [employer] recommendations would include increased cancer surveillance and possible prophylactic surgery. If a mutation is detected, the patient will be referred back to the referring provider and to any additional appropriate care providers to discuss the relevant options.   If a mutation is not found in the patient, this will decrease the likelihood of  a hereditary cancer syndrome as the explanation for her breast cancer. Cancer surveillance options would be discussed for the patient according to the appropriate standard National Comprehensive Cancer Network and American Cancer Society guidelines, with consideration of their personal and family history risk factors. In this case, the patient will be referred back to their care providers for discussions of management.   In order to estimate her  chance of having a BRCA1 or BRCA2 mutation, we used statistical models (Penn II) and laboratory data that take into account her personal medical history, family history and ancestry.  Because each model is different, there can be a lot of variability in the risks they give.  Therefore, these numbers must be considered a rough range and not a precise risk of having a BRCA1 nad BRCA2 mutation.  These models estimate that she has approximately a 20% chance of having a mutation. Based on this assessment of her family and personal history, genetic testing is recommended.  After considering the risks, benefits, and limitations, the patient provided informed consent for  the following  Testing: [facility] through [employer].   Per the patient's request, we will contact her by telephone to discuss these results. A follow up genetic counseling visit will be scheduled if indicated.  The patient was seen for a total of 45 minutes, greater than 50% of which was spent face-to-face counseling.  This plan is being carried out per Dr. Milta Deiters recommendations.  This note will also be sent to the referring provider via the electronic medical record. The patient will be supplied with a summary of this genetic counseling discussion as well as educational information on the discussed hereditary cancer syndromes following the conclusion of their visit.   Patient was discussed with Dr. Drue Second.  EDUCATIONAL INFORMATION SUPPLIED TO PATIENT AT ENCOUNTER:  Hereditary breast and ovarian cancer syndrome booklet    _______________________________________________________________________ For Office Staff:  Number of people involved in session: 4 Was an Intern/ student involved with case: yes

## 2011-06-25 ENCOUNTER — Encounter: Payer: Self-pay | Admitting: Genetic Counselor

## 2011-07-10 NOTE — Progress Notes (Signed)
Encounter addended by: Glennie Hawk, RN on: 07/10/2011  1:43 PM<BR>     Documentation filed: Charges VN

## 2011-07-12 ENCOUNTER — Encounter: Payer: Self-pay | Admitting: Genetic Counselor

## 2011-07-15 ENCOUNTER — Telehealth: Payer: Self-pay | Admitting: *Deleted

## 2011-07-15 NOTE — Telephone Encounter (Signed)
per voice message from patient on 07-15-2011 patient has found another knot

## 2011-07-19 ENCOUNTER — Ambulatory Visit (HOSPITAL_BASED_OUTPATIENT_CLINIC_OR_DEPARTMENT_OTHER): Payer: BC Managed Care – PPO | Admitting: Oncology

## 2011-07-19 ENCOUNTER — Encounter: Payer: Self-pay | Admitting: Oncology

## 2011-07-19 VITALS — BP 114/76 | HR 65 | Temp 98.5°F | Ht 67.0 in | Wt 193.5 lb

## 2011-07-19 DIAGNOSIS — Z79899 Other long term (current) drug therapy: Secondary | ICD-10-CM

## 2011-07-19 DIAGNOSIS — D0590 Unspecified type of carcinoma in situ of unspecified breast: Secondary | ICD-10-CM

## 2011-07-19 DIAGNOSIS — D0501 Lobular carcinoma in situ of right breast: Secondary | ICD-10-CM

## 2011-07-19 NOTE — Patient Instructions (Addendum)
Follow up as scheduled.  

## 2011-07-19 NOTE — Progress Notes (Signed)
[name] [id number] 01-Dec-1970 40 y.o. 07/19/2011 10:51 AM  CC Dr. Claud Kelp  INTERVAL HISTORY: patient was seen urgently today as she noticed in the left axilla a nodule she felt that this could possibly be a lymph node and she was very anxious and quite concerned. Because of this she was brought into the hospital for an examination. She clinically otherwise had no other complaints she denied any nausea vomiting. She has noticed this area of small nodule for the last several weeks and she noticed that it had gotten a little bit to her. It is slightly tender as well on palpation. She has not noticed any other nodularity. She otherwise denies any vaginal bleeding or discharge her aches or pains or any swelling in her upper extremities or lower extremities. Interval the 10 point review of systems is unremarkable.  Past Medical History  Diagnosis Date  . Breast cancer   . Cancer     right lobular carcinoma in situ  . Gastroparesis     controlled    Past Surgical History  Procedure Date  . Tubal ligation 2001  . Back surgery 10/2002    lumbar hemilaminectomy, microdiscectomy L5-S1  . Mastectomy, partial 12/19/2010    Procedure: MASTECTOMY PARTIAL;  Surgeon: Ernestene Mention, MD;  Location: [facility];  Service: General;  Laterality: Right;  right partial mastectomy and needle localization  . Breast surgery     Family History  Problem Relation Age of Onset  . Hypertension Mother   . Hyperlipidemia Mother   . Heart disease Father     arrythmia  . Hypertension Father   . Cancer Maternal Uncle     pancreatic    Social History History  Substance Use Topics  . Smoking status: Former Smoker    Quit date: 08/12/2000  . Smokeless tobacco: Never Used   Comment: quit 10 yrs  . Alcohol Use: Yes     occasionally    Allergies  Allergen Reactions  . Amoxicillin-Pot Clavulanate Hives    Current Outpatient Prescriptions  Medication Sig Dispense Refill  .  ibuprofen (ADVIL,MOTRIN) 200 MG tablet Take 200 mg by mouth every 6 (six) hours as needed.        . tamoxifen (NOLVADEX) 20 MG tablet Take 1 tablet (20 mg total) by mouth daily.  90 tablet  3    REVIEW OF SYSTEMS:  Pertinent items are noted in HPI.  ECOG performance Status:0  PHYSICAL EXAMINATION: Blood pressure 114/76, pulse 65, temperature 98.5 F (36.9 C), temperature source Oral, height 5\' 7"  (1.702 m), weight 193 lb 8 oz (87.771 kg). In examination of the left axilla she has noticed to have what looks like an ingrown hair. I was able to move some pus from this area. There was no other evidence of lymphadenopathy in the left axilla or the right axilla. Other lymphadenopathy was noted.   STUDIES/RESULTS: No results found.    ASSESSMENT    41 year old female with lobular carcinoma in situ she is status post right breast lumpectomy without any further evidence of disease or invasive cancer. So patient really has and LCIS lesion. Patient is now on tamoxifen 20 mg daily and she is tolerating it quite well. she is seen with complaints of left axillary nodule which in fact turned out to be an ingrown hair with some pus which was expressed out with out any other problems.  PLAN:  #1 we will continue the tamoxifen.  #2 patient is requesting to be seen  by our genetic counselor and I have made a referral.  #3 she will be seen back in 6 months time in followup.  All questions were answered. The patient knows to call the clinic with any problems, questions or concerns. We can certainly see the patient much sooner if necessary.  Thank you so much for allowing me to participate in the care of Cande JANANI CHAMBER. I will continue to follow up the patient with you and assist in her care.  I spent 20 minutes counseling the patient face to face. The total time spent in the appointment was 30 minutes.  Drue Second, MD Medical/Oncology [facility] [phone] (beeper) [phone]  (Office)  07/19/2011, 10:51 AM 07/19/2011, 10:51 AM

## 2011-10-24 ENCOUNTER — Other Ambulatory Visit (HOSPITAL_COMMUNITY): Payer: Self-pay | Admitting: Obstetrics and Gynecology

## 2011-10-24 DIAGNOSIS — C50919 Malignant neoplasm of unspecified site of unspecified female breast: Secondary | ICD-10-CM

## 2011-11-20 ENCOUNTER — Ambulatory Visit (HOSPITAL_COMMUNITY)
Admission: RE | Admit: 2011-11-20 | Discharge: 2011-11-20 | Disposition: A | Discharge status: Home / Self Care | Payer: BC Managed Care – PPO | Source: Ambulatory Visit | Attending: Obstetrics and Gynecology | Admitting: Obstetrics and Gynecology

## 2011-11-20 DIAGNOSIS — Z853 Personal history of malignant neoplasm of breast: Secondary | ICD-10-CM | POA: Insufficient documentation

## 2011-11-20 DIAGNOSIS — C50919 Malignant neoplasm of unspecified site of unspecified female breast: Secondary | ICD-10-CM

## 2011-12-19 ENCOUNTER — Other Ambulatory Visit: Payer: BC Managed Care – PPO | Admitting: Lab

## 2011-12-19 ENCOUNTER — Ambulatory Visit: Payer: BC Managed Care – PPO | Admitting: Oncology

## 2012-01-03 ENCOUNTER — Telehealth: Payer: Self-pay | Admitting: Oncology

## 2012-01-03 ENCOUNTER — Other Ambulatory Visit (HOSPITAL_BASED_OUTPATIENT_CLINIC_OR_DEPARTMENT_OTHER): Payer: BC Managed Care – PPO | Admitting: Lab

## 2012-01-03 ENCOUNTER — Ambulatory Visit (HOSPITAL_BASED_OUTPATIENT_CLINIC_OR_DEPARTMENT_OTHER): Payer: BC Managed Care – PPO | Admitting: Oncology

## 2012-01-03 ENCOUNTER — Encounter: Payer: Self-pay | Admitting: Oncology

## 2012-01-03 VITALS — BP 126/82 | HR 73 | Temp 98.4°F | Resp 20 | Ht 67.0 in | Wt 202.6 lb

## 2012-01-03 DIAGNOSIS — D0590 Unspecified type of carcinoma in situ of unspecified breast: Secondary | ICD-10-CM

## 2012-01-03 DIAGNOSIS — D0501 Lobular carcinoma in situ of right breast: Secondary | ICD-10-CM

## 2012-01-03 LAB — CBC WITH DIFFERENTIAL/PLATELET
BASO%: 0.6 % (ref 0.0–2.0)
Basophils Absolute: 0 10*3/uL (ref 0.0–0.1)
EOS%: 2.5 % (ref 0.0–7.0)
HGB: 13.6 g/dL (ref 11.6–15.9)
MCH: 30.5 pg (ref 25.1–34.0)
MCHC: 33.9 g/dL (ref 31.5–36.0)
MCV: 89.9 fL (ref 79.5–101.0)
MONO%: 7 % (ref 0.0–14.0)
RDW: 12.4 % (ref 11.2–14.5)
lymph#: 2.2 10*3/uL (ref 0.9–3.3)

## 2012-01-03 LAB — COMPREHENSIVE METABOLIC PANEL (CC13)
ALT: 30 U/L (ref 0–55)
AST: 29 U/L (ref 5–34)
Albumin: 3.5 g/dL (ref 3.5–5.0)
Alkaline Phosphatase: 64 U/L (ref 40–150)
BUN: 15 mg/dL (ref 7.0–26.0)
Creatinine: 1 mg/dL (ref 0.6–1.1)
Potassium: 3.6 mEq/L (ref 3.5–5.1)

## 2012-01-03 NOTE — Telephone Encounter (Signed)
gve the pt her jan 2014 appt calendar °

## 2012-01-03 NOTE — Progress Notes (Signed)
[name] [id number] 07/28/1970 41 y.o. 01/03/2012 3:47 PM  CC: Dr. Claud Kelp  PRIOR THERAPY:  1. S/P Lumpectomy for LCIS  CURRENT THERAPY: Tamoxifen 20 mg daily  INTERVAL HISTORY: 41 year old female returns in follow up for her LCIS. She recently had vaginal ultrasound that showed thickening of the uterine lining. She discontinued the tamoxifen because of concern of uterine malignancy. She has restarted the tamoxifen yesterday. She is tolerating it well. No fevers or chills or nausea or vomiting. Remainder of the 10 point review of systems is negative.  Past Medical History  Diagnosis Date  . Breast cancer   . Cancer     right lobular carcinoma in situ  . Gastroparesis     controlled    Past Surgical History  Procedure Date  . Tubal ligation 2001  . Back surgery 10/2002    lumbar hemilaminectomy, microdiscectomy L5-S1  . Mastectomy, partial 12/19/2010    Procedure: MASTECTOMY PARTIAL;  Surgeon: Ernestene Mention, MD;  Location: [facility];  Service: General;  Laterality: Right;  right partial mastectomy and needle localization  . Breast surgery     Family History  Problem Relation Age of Onset  . Hypertension Mother   . Hyperlipidemia Mother   . Heart disease Father     arrythmia  . Hypertension Father   . Cancer Maternal Uncle     pancreatic    Social History History  Substance Use Topics  . Smoking status: Former Smoker    Quit date: 08/12/2000  . Smokeless tobacco: Never Used     Comment: quit 10 yrs  . Alcohol Use: Yes     Comment: occasionally    Allergies  Allergen Reactions  . Amoxicillin-Pot Clavulanate Hives    Current Outpatient Prescriptions  Medication Sig Dispense Refill  . ibuprofen (ADVIL,MOTRIN) 200 MG tablet Take 200 mg by mouth every 6 (six) hours as needed.        . tamoxifen (NOLVADEX) 20 MG tablet Take 1 tablet (20 mg total) by mouth daily.  90 tablet  3    REVIEW OF SYSTEMS:  Pertinent items are noted in  HPI.  ECOG performance Status:0  PHYSICAL EXAMINATION: Blood pressure 126/82, pulse 73, temperature 98.4 F (36.9 C), temperature source Oral, resp. rate 20, height 5\' 7"  (1.702 m), weight 202 lb 9.6 oz (91.899 kg). HEENT: EOMI, PERRLA neck is supple no lymphadenopathy. LUNGS: clear to auscultation and percussion CVS:  RRR no murmur ABDOMEN: soft NT/ND BS+, No HSM EXT: no CCE NEURO: nonfocal Breast Exam: Right healed surgical scar, no masses or nipple retraction; left breast no masses or nipple discharge  STUDIES/RESULTS: No results found.  ASSESSMENT    41 year old female with lobular carcinoma in situ she is status post right breast lumpectomy without any further evidence of disease or invasive cancer. So patient really has and LCIS lesion. Patient is now on tamoxifen 20 mg daily and she is tolerating it quite well. she is seen with complaints of left axillary nodule which in fact turned out to be an ingrown hair with some pus which was expressed out with out any other problems.  PLAN:  #1 we will continue the tamoxifen.  #2 she will be seen back in 6 months time in followup.  All questions were answered. The patient knows to call the clinic with any problems, questions or concerns. We can certainly see the patient much sooner if necessary.  Thank you so much for allowing me to participate in  the care of [name]. I will continue to follow up the patient with you and assist in her care.  I spent 20 minutes counseling the patient face to face. The total time spent in the appointment was 30 minutes.  Drue Second, MD Medical/Oncology [facility] [phone] (beeper) [phone] (Office)  01/03/2012, 3:47 PM 01/03/2012, 3:47 PM

## 2012-01-03 NOTE — Patient Instructions (Addendum)
Continue tamoxifen  We will see you back in January 2014

## 2012-02-18 ENCOUNTER — Encounter (INDEPENDENT_AMBULATORY_CARE_PROVIDER_SITE_OTHER): Payer: Self-pay | Admitting: General Surgery

## 2012-02-18 ENCOUNTER — Ambulatory Visit (INDEPENDENT_AMBULATORY_CARE_PROVIDER_SITE_OTHER): Payer: BC Managed Care – PPO | Admitting: General Surgery

## 2012-02-18 VITALS — BP 132/78 | HR 74 | Temp 97.7°F | Resp 18 | Ht 67.0 in | Wt 201.2 lb

## 2012-02-18 DIAGNOSIS — D0501 Lobular carcinoma in situ of right breast: Secondary | ICD-10-CM

## 2012-02-18 DIAGNOSIS — D0590 Unspecified type of carcinoma in situ of unspecified breast: Secondary | ICD-10-CM

## 2012-02-18 NOTE — Progress Notes (Signed)
Patient ID: [name], female   DOB: Oct 08, 1970, 42 y.o.   MRN: [id number] History: This patient underwent image guided biopsy showing lobular carcinoma in situ in the lateral right breast. On 12/19/2010 she underwent right partial mastectomy with needle localization which showed no evidence of residual lobular carcinoma in situ, so this was a small area. She recovered uneventfully. She was referred to Dr. Drue Second of the high risk clinic and was placed on tamoxifen 20 mg a day, which she is tolerating well. She sees Dr. Welton Flakes every 6 months. Mammograms were performed on 11/20/2011 which looked normal, no focal abnormality, category 2. Patient has no complaints about her breast.  ROS: 10 system review of systems is negative except as described above  Exam: Patient looks well. No distress. Neck: No adenopathy. No mass. No jugular venous distention. Lungs: Clear auscultation bilaterally Heart: Regular rate and rhythm. No ectopy. No murmur Breast medium to large size. Well-healed transverse scar at 9:00 position right breast. Excellent contour and cosmesis. No palpable mass in either breast. Nipple areolar complexes looked normal. No axillary adenopathy.  Assessment: LCIS right breast, status post excision BRCA negative Increase risk for breast cancer long-term  Plan: Continue tamoxifen Continue periodic follow Dr. Drue Second Return to see me if further problems arise.   Angelia Mould. Derrell Lolling, M.D., [facility] General and Minimally invasive Surgery Breast and Colorectal Surgery Office:   [phone] Pager:   [phone]

## 2012-02-18 NOTE — Patient Instructions (Signed)
Your breast exam today is normal, and your recent mammograms are also normal.  I recommend that you continue the tamoxifen medicine to lower your risk of breast cancer in the future.  I recommend that you see Dr. Drue Second every 6 months for breast exam.  Return to see Dr. Derrell Lolling if further problems arise.

## 2012-03-11 ENCOUNTER — Ambulatory Visit: Payer: BC Managed Care – PPO | Admitting: Adult Health

## 2012-03-11 ENCOUNTER — Other Ambulatory Visit: Payer: BC Managed Care – PPO | Admitting: Lab

## 2012-04-01 ENCOUNTER — Encounter: Payer: Self-pay | Admitting: Adult Health

## 2012-04-01 ENCOUNTER — Ambulatory Visit (HOSPITAL_BASED_OUTPATIENT_CLINIC_OR_DEPARTMENT_OTHER): Payer: BC Managed Care – PPO | Admitting: Adult Health

## 2012-04-01 ENCOUNTER — Other Ambulatory Visit: Payer: BC Managed Care – PPO

## 2012-04-01 ENCOUNTER — Telehealth: Payer: Self-pay | Admitting: Oncology

## 2012-04-01 VITALS — BP 113/77 | HR 68 | Temp 98.2°F | Resp 20 | Ht 67.0 in | Wt 196.0 lb

## 2012-04-01 LAB — COMPREHENSIVE METABOLIC PANEL (CC13)
Albumin: 3.6 g/dL (ref 3.5–5.0)
Alkaline Phosphatase: 71 U/L (ref 40–150)
BUN: 13.8 mg/dL (ref 7.0–26.0)
CO2: 23 mEq/L (ref 22–29)
Glucose: 98 mg/dl (ref 70–99)
Sodium: 140 mEq/L (ref 136–145)
Total Bilirubin: 0.5 mg/dL (ref 0.20–1.20)
Total Protein: 7.1 g/dL (ref 6.4–8.3)

## 2012-04-01 LAB — CBC WITH DIFFERENTIAL/PLATELET
Basophils Absolute: 0 10*3/uL (ref 0.0–0.1)
Eosinophils Absolute: 0.1 10*3/uL (ref 0.0–0.5)
HCT: 39.8 % (ref 34.8–46.6)
HGB: 13.6 g/dL (ref 11.6–15.9)
LYMPH%: 23 % (ref 14.0–49.7)
MCV: 89.8 fL (ref 79.5–101.0)
MONO#: 0.5 10*3/uL (ref 0.1–0.9)
MONO%: 5.8 % (ref 0.0–14.0)
NEUT#: 5.4 10*3/uL (ref 1.5–6.5)
Platelets: 178 10*3/uL (ref 145–400)
WBC: 7.8 10*3/uL (ref 3.9–10.3)

## 2012-04-01 NOTE — Progress Notes (Signed)
[name] [id number] 11-Oct-1970 42 y.o. 04/01/2012 11:05 PM  CC: Dr. Claud Kelp  PRIOR THERAPY:  1. S/P Lumpectomy for LCIS  CURRENT THERAPY: Tamoxifen 20 mg daily  INTERVAL HISTORY: 42 year old female returns in follow up for her LCIS. She is doing well.  She has some occasional tolerable hot flashes but is otherwise doing well.  When she had her gynecologic examination her gyn did a vaginal ultrasound that showed thickening of the uterus, however, the biopsy was negative.  Otherwise she's doing well.  A 10 point ROS is neg. Her health maintenance was reviewed as below.  Past Medical History  Diagnosis Date  . Breast cancer   . Cancer     right lobular carcinoma in situ  . Gastroparesis     controlled    Past Surgical History  Procedure Laterality Date  . Tubal ligation  2001  . Back surgery  10/2002    lumbar hemilaminectomy, microdiscectomy L5-S1  . Mastectomy, partial  12/19/2010    Procedure: MASTECTOMY PARTIAL;  Surgeon: Ernestene Mention, MD;  Location: [facility];  Service: General;  Laterality: Right;  right partial mastectomy and needle localization  . Breast surgery      Family History  Problem Relation Age of Onset  . Hypertension Mother   . Hyperlipidemia Mother   . Heart disease Father     arrythmia  . Hypertension Father   . Cancer Maternal Uncle     pancreatic    Social History History  Substance Use Topics  . Smoking status: Former Smoker    Quit date: 08/12/2000  . Smokeless tobacco: Never Used     Comment: quit 10 yrs  . Alcohol Use: Yes     Comment: occasionally    Allergies  Allergen Reactions  . Amoxicillin-Pot Clavulanate Hives    Current Outpatient Prescriptions  Medication Sig Dispense Refill  . ibuprofen (ADVIL,MOTRIN) 200 MG tablet Take 200 mg by mouth every 6 (six) hours as needed.        Marland Kitchen PROAIR HFA 108 (90 BASE) MCG/ACT inhaler       . tamoxifen (NOLVADEX) 20 MG tablet Take 1 tablet (20 mg total) by mouth  daily.  90 tablet  3   No current facility-administered medications for this visit.    REVIEW OF SYSTEMS: [name]. Lyn Hollingshead, NP Medical Oncology [facility] Phone: [phone]  Health Maintenance  Mammogram: 10/13 Colonoscopy: n/a Bone Density Scan: n/a Pap Smear: 08/13 Eye Exam: due Vitamin D Level: n/a    PHYSICAL EXAMINATION: Blood pressure 113/77, pulse 68, temperature 98.2 F (36.8 C), temperature source Oral, resp. rate 20, height 5\' 7"  (1.702 m), weight 196 lb (88.905 kg). General: Patient is a well appearing female in no acute distress HEENT: PERRLA, sclerae anicteric no conjunctival pallor, MMM Neck: supple, no palpable adenopathy Lungs: clear to auscultation bilaterally, no wheezes, rhonchi, or rales Cardiovascular: regular rate rhythm, S1, S2, no murmurs, rubs or gallops Abdomen: Soft, non-tender, non-distended, normoactive bowel sounds, no HSM Extremities: warm and well perfused, no clubbing, cyanosis, or edema Skin: No rashes or lesions Neuro: Non-focal Breast Exam: Right healed surgical scar, no masses or nipple retraction; left breast no masses or nipple discharge ECOG performance Status:0  STUDIES/RESULTS: No results found.  ASSESSMENT    42 year old female with lobular carcinoma in situ she is status post right breast lumpectomy without any further evidence of disease or invasive cancer. So patient really has and LCIS lesion. Patient is  now on tamoxifen 20 mg daily and she is tolerating it quite well. she is seen with complaints of left axillary nodule which in fact turned out to be an ingrown hair with some pus which was expressed out with out any other problems.  PLAN:  #1 we will continue the tamoxifen. We discussed survivorship and healthy eating/excercise.  She will get an appointment for an eye exam.    #2 she will be seen back in 6 months time in followup.  All questions were answered. The patient knows to call the clinic  with any problems, questions or concerns. We can certainly see the patient much sooner if necessary.  Thank you so much for allowing me to participate in the care of [name]. I will continue to follow up the patient with you and assist in her care.  I spent 25 minutes counseling the patient face to face. The total time spent in the appointment was 30 minutes.  [name] Lyn Hollingshead, NP Medical Oncology [facility] Phone: [phone] 04/01/2012, 11:05 PM

## 2012-04-01 NOTE — Telephone Encounter (Signed)
lmonvm adviisng the pt of her aug 2014 appts

## 2012-04-01 NOTE — Telephone Encounter (Signed)
Pt already on schedule for August and was given new schedule.

## 2012-04-01 NOTE — Patient Instructions (Addendum)
Doing well.  No sign of recurrence.  We will see you back in 6 months.

## 2012-09-24 ENCOUNTER — Telehealth: Payer: Self-pay | Admitting: Oncology

## 2012-09-25 ENCOUNTER — Telehealth: Payer: Self-pay | Admitting: Oncology

## 2012-09-25 NOTE — Telephone Encounter (Signed)
, °

## 2012-09-28 ENCOUNTER — Encounter (HOSPITAL_COMMUNITY): Payer: Self-pay

## 2012-09-28 ENCOUNTER — Encounter (HOSPITAL_COMMUNITY): Payer: BC Managed Care – PPO | Attending: Hematology and Oncology

## 2012-09-28 ENCOUNTER — Encounter (HOSPITAL_COMMUNITY): Payer: BC Managed Care – PPO

## 2012-09-28 VITALS — BP 112/78 | HR 75 | Temp 97.3°F | Resp 20 | Wt 209.2 lb

## 2012-09-28 DIAGNOSIS — D0501 Lobular carcinoma in situ of right breast: Secondary | ICD-10-CM

## 2012-09-28 DIAGNOSIS — Z09 Encounter for follow-up examination after completed treatment for conditions other than malignant neoplasm: Secondary | ICD-10-CM | POA: Insufficient documentation

## 2012-09-28 DIAGNOSIS — D0590 Unspecified type of carcinoma in situ of unspecified breast: Secondary | ICD-10-CM

## 2012-09-28 LAB — COMPREHENSIVE METABOLIC PANEL
ALT: 14 U/L (ref 0–35)
Alkaline Phosphatase: 72 U/L (ref 39–117)
BUN: 13 mg/dL (ref 6–23)
CO2: 24 mEq/L (ref 19–32)
GFR calc Af Amer: 77 mL/min — ABNORMAL LOW (ref >90)
GFR calc non Af Amer: 67 mL/min — ABNORMAL LOW (ref >90)
Glucose, Bld: 98 mg/dL (ref 70–99)
Potassium: 3.8 mEq/L (ref 3.5–5.1)
Sodium: 137 mEq/L (ref 135–145)
Total Bilirubin: 0.3 mg/dL (ref 0.3–1.2)

## 2012-09-28 LAB — CBC WITH DIFFERENTIAL/PLATELET
Hemoglobin: 13.9 g/dL (ref 12.0–15.0)
Lymphocytes Relative: 37 % (ref 12–46)
Lymphs Abs: 2.6 10*3/uL (ref 0.7–4.0)
MCH: 30.2 pg (ref 26.0–34.0)
MCV: 89.1 fL (ref 78.0–100.0)
Monocytes Relative: 5 % (ref 3–12)
Neutrophils Relative %: 56 % (ref 43–77)
Platelets: 240 10*3/uL (ref 150–400)
RBC: 4.6 MIL/uL (ref 3.87–5.11)
WBC: 7 10*3/uL (ref 4.0–10.5)

## 2012-09-28 NOTE — Progress Notes (Signed)
Patient History and Physical   [name] [id number] 06/06/1970 42 y.o. 09/28/2012   Chief Complaint: LCIS of the right breast.   HPI:  [name] is a 42 year old woman with known history of LCIS of the right breast that was diagnosed on 11/19/2010 following an image guided needle core biopsy. This lesion was reportedly seen on a mammogram prior to this.She underwent lumpectomy  On 12/20/2010 and his surgical specimen showed the no residual evidence of malignancy hyperplasia or atypia.  Fibrocystic changes were noted. Patient was then evaluated by radiation oncology and breast irradiation was not recommended. She sees Victorino December, MD as medical oncology and Angelia Mould. Derrell Lolling, M.D., [facility] as Careers adviser.  She tell me that she has not taken tamoxifen since June 2014 due to concerns about the 'uterine cancer risk and ovary'. She did indeed admit that she has been poorly compliant with tamoxifen otherwise tolerated it fairly well.  She has had occasional flashes  While she was on it but denies any serious musculoskeletal complaints.  She is also not having any abnormal menstrual bleeding.  She does report  cyclical breast tenderness.  According to Dr Milta Deiters note, she was  restarted on tamoxifen in November 2013 after she had stopped for a period on  concern of uterine thickening and potential cancer risk. She tell me that she  is here today for the propose of transferring her care to  [facility] since she lives in [place].  She has had BRCA1 and 2 testing which would negative for any mutation. She is accompanied by her husband.  PMH: Past Medical History  Diagnosis Date  . Breast cancer   . Cancer     right lobular carcinoma in situ  . Gastroparesis     controlled    Past Surgical History  Procedure Laterality Date  . Tubal ligation  2001  . Back surgery  10/2002    lumbar hemilaminectomy, microdiscectomy L5-S1  . Mastectomy, partial  12/19/2010    Procedure: MASTECTOMY PARTIAL;   Surgeon: Ernestene Mention, MD;  Location: [facility];  Service: General;  Laterality: Right;  right partial mastectomy and needle localization  . Breast surgery     Gynecologic history:  Patient had menarche at 42, age of first pregnancy 46 and  denies history of oral contraceptive pill use.  LMP was in 09/14/11.  Allergies: Allergies  Allergen Reactions  . Amoxicillin-Pot Clavulanate Hives    Medications: Current outpatient prescriptions:ibuprofen (ADVIL,MOTRIN) 200 MG tablet, Take 200 mg by mouth every 6 (six) hours as needed.  , Disp: , Rfl: ;  tamoxifen (NOLVADEX) 20 MG tablet, Take 1 tablet (20 mg total) by mouth daily., Disp: 90 tablet, Rfl: 3;  PROAIR HFA 108 (90 BASE) MCG/ACT inhaler, , Disp: , Rfl:    Social History:   reports that she quit smoking about 12 years ago. She has never used smokeless tobacco. She reports that  drinks alcohol. She reports that she does not use illicit drugs. Has 16 pack years of smoking. Drinks occasionally 1-2 times a weeks . She has 2 children son 86, daughter 52 . She also has 2 brothers and 2 sisters with no serious health issues per patient.  Work as a Producer, television/film/video.  Family History: Family History  Problem Relation Age of Onset  . Hypertension Mother   . Hyperlipidemia Mother   . Heart disease Father     arrythmia  . Hypertension Father   . Cancer Maternal Uncle  pancreatic    Review of Systems: 14 point review of system is as in the history above otherwise negative.   Physical Exam: Blood pressure 112/78, pulse 75, temperature 97.3 F (36.3 C), temperature source Oral, resp. rate 20, weight 209 lb 3.2 oz (94.892 kg). GENERAL: No distress, .  SKIN:  No rashes or significant lesions , no ecchymosis or petechia or rash. HEAD: Normocephalic, No masses, lesions, tenderness or abnormalities  EYES: Conjunctiva are pink , non-injected and no jaundice. LYMPH: No palpable lymphadenopathy,  In the neck supraclavicular area or  axilla. BREAST: Well-healed like a surgical scar in the right.  No masses were palpable in both breasts. There was no significant nipple tenderness. LUNGS: Clear to auscultation , no crackles or wheezes HEART: regular rate & rhythm, no murmurs, no gallops, S1 normal and S2 normal  ABDOMEN: Abdomen soft, non-tender, normal bowel sounds, no masses or organomegaly and no hepatosplenomegaly palpable.  EXTREMITIES: No edema, no skin discoloration or tenderness. NEURO: Alert & oriented , no focal motor deficits.     Lab Results: Lab Results  Component Value Date   WBC 7.8 04/01/2012   HGB 13.6 04/01/2012   HCT 39.8 04/01/2012   MCV 89.8 04/01/2012   PLT 178 04/01/2012     Chemistry      Component Value Date/Time   NA 140 04/01/2012 1327   NA 139 06/17/2011 1146   K 3.5 04/01/2012 1327   K 3.8 06/17/2011 1146   CL 106 04/01/2012 1327   CL 106 06/17/2011 1146   CO2 23 04/01/2012 1327   CO2 25 06/17/2011 1146   BUN 13.8 04/01/2012 1327   BUN 16 06/17/2011 1146   CREATININE 1.0 04/01/2012 1327   CREATININE 1.11* 06/17/2011 1146      Component Value Date/Time   CALCIUM 9.2 04/01/2012 1327   CALCIUM 9.1 06/17/2011 1146   ALKPHOS 71 04/01/2012 1327   ALKPHOS 50 06/17/2011 1146   AST 18 04/01/2012 1327   AST 22 06/17/2011 1146   ALT 20 04/01/2012 1327   ALT 20 06/17/2011 1146   BILITOT 0.50 04/01/2012 1327   BILITOT 0.5 06/17/2011 1146      Impression: LCIS of the right breast, status post surgical resection and less than 2 years of inconsistent use of tamoxifen. I  had a very long discussion with the patient her husband and I explained the role of tamoxifen in chemoprevention for her diagnosis of the lack of LCIS which I explained to her increases her risk of developing another breast cancer. I cited the NSABP P1 study which showed that 5 years of tamoxifen reduce the risk of breast cancer by approximately 50%.  I counseled her extensively on compliance and we also discussed the small risk of uterine cancer and  risk of DVT with tamoxifen.  I did however tell her that patients tolerate  tamoxifen well and that the risk of these adverse events are very small and the benefit trumps them in most cases. She has no clinical evidence of disease at this time.  Recommendations: 1.Yearly mammogram. 2. Yearly GYN exam. 3. Six-monthly clinic visit at this time. 4. Five years of adjuvant the and tamoxifen for breast cancer risk reduction . Given the inconsistencies and the 'on and off' way she has been taking it, it would be reasonable to give this for a total of 6 years, to account for the periods she has been off.  All questions were satisfactorily answered.She knows to call if she has any concern.  I spent 50% of the time was spent counseling the patient face to face. The total time spent in the appointment was 60 minutes.   Sherral Hammers, MD FACP. Hematology/Oncology.     Marland Kitchen

## 2012-09-28 NOTE — Patient Instructions (Addendum)
[facility] Discharge Instructions  RECOMMENDATIONS MADE BY THE CONSULTANT AND ANY TEST RESULTS WILL BE SENT TO YOUR REFERRING PHYSICIAN.  EXAM FINDINGS BY THE PHYSICIAN TODAY AND SIGNS OR SYMPTOMS TO REPORT TO CLINIC OR PRIMARY PHYSICIAN: Exam and discussion by Dr. Sharia Reeve.  MEDICATIONS PRESCRIBED:  Restart your tamoxifen.  If refills are needed call Tobie Lords, RN [phone])  INSTRUCTIONS GIVEN AND DISCUSSED: Report any new lumps, bone pain, shortness of breath or other symptoms.  SPECIAL INSTRUCTIONS/FOLLOW-UP: Follow-up in 6 months.  Thank you for choosing [facility] to provide your oncology and hematology care.  To afford each patient quality time with our providers, please arrive at least 15 minutes before your scheduled appointment time.  With your help, our goal is to use those 15 minutes to complete the necessary work-up to ensure our physicians have the information they need to help with your evaluation and healthcare recommendations.    Effective January 1st, 2014, we ask that you re-schedule your appointment with our physicians should you arrive 10 or more minutes late for your appointment.  We strive to give you quality time with our providers, and arriving late affects you and other patients whose appointments are after yours.    Again, thank you for choosing [facility].  Our hope is that these requests will decrease the amount of time that you wait before being seen by our physicians.       _____________________________________________________________  Should you have questions after your visit to [facility], please contact our office at [phone] between the hours of 8:30 a.m. and 5:00 p.m.  Voicemails left after 4:30 p.m. will not be returned until the following business day.  For prescription refill requests, have your pharmacy contact our office with your prescription refill request.

## 2012-09-30 ENCOUNTER — Other Ambulatory Visit: Payer: BC Managed Care – PPO | Admitting: Lab

## 2012-09-30 ENCOUNTER — Ambulatory Visit: Payer: BC Managed Care – PPO | Admitting: Oncology

## 2012-10-01 ENCOUNTER — Telehealth (HOSPITAL_COMMUNITY): Payer: Self-pay | Admitting: Oncology

## 2012-10-01 DIAGNOSIS — C50919 Malignant neoplasm of unspecified site of unspecified female breast: Secondary | ICD-10-CM

## 2012-10-01 MED ORDER — TAMOXIFEN CITRATE 20 MG PO TABS: 20.0000 mg | ORAL_TABLET | Freq: Every day | ORAL | Status: DC

## 2012-10-01 NOTE — Telephone Encounter (Signed)
[name] [facility] called requesting prescription refill for her Tamoxifen - refill e-scribed to [facility].  Message left on patient's voicemail with request to call back to confirm receipt of message.

## 2012-11-24 ENCOUNTER — Other Ambulatory Visit (HOSPITAL_COMMUNITY): Payer: Self-pay | Admitting: Oncology

## 2012-11-24 DIAGNOSIS — C50911 Malignant neoplasm of unspecified site of right female breast: Secondary | ICD-10-CM

## 2012-12-09 ENCOUNTER — Encounter (HOSPITAL_COMMUNITY): Payer: BC Managed Care – PPO

## 2012-12-09 ENCOUNTER — Ambulatory Visit (HOSPITAL_COMMUNITY)
Admission: RE | Admit: 2012-12-09 | Discharge: 2012-12-09 | Disposition: A | Discharge status: Home / Self Care | Payer: BC Managed Care – PPO | Source: Ambulatory Visit | Attending: Oncology | Admitting: Oncology

## 2012-12-09 DIAGNOSIS — Z853 Personal history of malignant neoplasm of breast: Secondary | ICD-10-CM | POA: Insufficient documentation

## 2012-12-09 DIAGNOSIS — C50911 Malignant neoplasm of unspecified site of right female breast: Secondary | ICD-10-CM

## 2013-03-30 ENCOUNTER — Telehealth: Payer: Self-pay | Admitting: Oncology

## 2013-03-30 NOTE — Progress Notes (Signed)
-   No show, letter sent- [name]   

## 2013-03-30 NOTE — Telephone Encounter (Signed)
Yes  Let her know we will be happy to see her back

## 2013-03-30 NOTE — Telephone Encounter (Signed)
, °

## 2013-03-31 ENCOUNTER — Ambulatory Visit (HOSPITAL_COMMUNITY): Payer: BC Managed Care – PPO | Admitting: Oncology

## 2013-12-01 ENCOUNTER — Other Ambulatory Visit (HOSPITAL_COMMUNITY): Payer: Self-pay | Admitting: Oncology

## 2013-12-01 DIAGNOSIS — Z09 Encounter for follow-up examination after completed treatment for conditions other than malignant neoplasm: Secondary | ICD-10-CM

## 2013-12-14 ENCOUNTER — Encounter (HOSPITAL_COMMUNITY): Payer: BC Managed Care – PPO

## 2013-12-20 ENCOUNTER — Encounter (HOSPITAL_COMMUNITY): Payer: BC Managed Care – PPO

## 2013-12-28 ENCOUNTER — Ambulatory Visit (HOSPITAL_COMMUNITY)
Admission: RE | Admit: 2013-12-28 | Discharge: 2013-12-28 | Disposition: A | Discharge status: Home / Self Care | Payer: BC Managed Care – PPO | Source: Ambulatory Visit | Attending: Oncology | Admitting: Oncology

## 2013-12-28 DIAGNOSIS — Z09 Encounter for follow-up examination after completed treatment for conditions other than malignant neoplasm: Secondary | ICD-10-CM

## 2014-10-06 ENCOUNTER — Other Ambulatory Visit (HOSPITAL_COMMUNITY): Payer: Self-pay | Admitting: Internal Medicine

## 2014-10-06 DIAGNOSIS — M545 Low back pain: Secondary | ICD-10-CM

## 2014-10-19 ENCOUNTER — Ambulatory Visit (HOSPITAL_COMMUNITY)
Admission: RE | Admit: 2014-10-19 | Discharge: 2014-10-19 | Disposition: A | Discharge status: Home / Self Care | Payer: BLUE CROSS/BLUE SHIELD | Source: Ambulatory Visit | Attending: Internal Medicine | Admitting: Internal Medicine

## 2014-10-19 DIAGNOSIS — M545 Low back pain: Secondary | ICD-10-CM | POA: Diagnosis present

## 2014-10-19 DIAGNOSIS — M5136 Other intervertebral disc degeneration, lumbar region: Secondary | ICD-10-CM | POA: Diagnosis not present

## 2014-10-19 DIAGNOSIS — M4806 Spinal stenosis, lumbar region: Secondary | ICD-10-CM | POA: Insufficient documentation

## 2014-10-19 DIAGNOSIS — M79604 Pain in right leg: Secondary | ICD-10-CM | POA: Insufficient documentation

## 2014-11-25 ENCOUNTER — Other Ambulatory Visit (HOSPITAL_COMMUNITY): Payer: Self-pay | Admitting: Oncology

## 2014-11-25 DIAGNOSIS — Z09 Encounter for follow-up examination after completed treatment for conditions other than malignant neoplasm: Secondary | ICD-10-CM

## 2015-01-03 ENCOUNTER — Ambulatory Visit (HOSPITAL_COMMUNITY)
Admission: RE | Admit: 2015-01-03 | Discharge: 2015-01-03 | Disposition: A | Discharge status: Home / Self Care | Payer: BLUE CROSS/BLUE SHIELD | Source: Ambulatory Visit | Attending: Oncology | Admitting: Oncology

## 2015-01-03 DIAGNOSIS — Z09 Encounter for follow-up examination after completed treatment for conditions other than malignant neoplasm: Secondary | ICD-10-CM

## 2015-01-03 DIAGNOSIS — Z853 Personal history of malignant neoplasm of breast: Secondary | ICD-10-CM | POA: Insufficient documentation

## 2015-11-28 ENCOUNTER — Other Ambulatory Visit (HOSPITAL_COMMUNITY): Payer: Self-pay | Admitting: Hematology & Oncology

## 2015-11-28 DIAGNOSIS — R229 Localized swelling, mass and lump, unspecified: Principal | ICD-10-CM

## 2015-11-28 DIAGNOSIS — IMO0002 Reserved for concepts with insufficient information to code with codable children: Secondary | ICD-10-CM

## 2016-01-08 ENCOUNTER — Other Ambulatory Visit (HOSPITAL_COMMUNITY): Payer: Self-pay | Admitting: Hematology & Oncology

## 2016-01-08 DIAGNOSIS — Z9889 Other specified postprocedural states: Secondary | ICD-10-CM

## 2016-01-08 DIAGNOSIS — R229 Localized swelling, mass and lump, unspecified: Principal | ICD-10-CM

## 2016-01-08 DIAGNOSIS — IMO0002 Reserved for concepts with insufficient information to code with codable children: Secondary | ICD-10-CM

## 2016-01-09 ENCOUNTER — Ambulatory Visit (HOSPITAL_COMMUNITY)
Admission: RE | Admit: 2016-01-09 | Discharge: 2016-01-09 | Disposition: A | Discharge status: Home / Self Care | Payer: 59 | Source: Ambulatory Visit | Attending: Hematology & Oncology | Admitting: Hematology & Oncology

## 2016-01-09 ENCOUNTER — Encounter (HOSPITAL_COMMUNITY): Payer: BLUE CROSS/BLUE SHIELD

## 2016-01-09 DIAGNOSIS — R229 Localized swelling, mass and lump, unspecified: Secondary | ICD-10-CM

## 2016-01-09 DIAGNOSIS — Z86000 Personal history of in-situ neoplasm of breast: Secondary | ICD-10-CM | POA: Diagnosis not present

## 2016-01-09 DIAGNOSIS — IMO0002 Reserved for concepts with insufficient information to code with codable children: Secondary | ICD-10-CM

## 2016-01-25 ENCOUNTER — Ambulatory Visit (INDEPENDENT_AMBULATORY_CARE_PROVIDER_SITE_OTHER): Payer: 59 | Admitting: Otolaryngology

## 2016-01-25 DIAGNOSIS — H6123 Impacted cerumen, bilateral: Secondary | ICD-10-CM | POA: Diagnosis not present

## 2016-01-25 DIAGNOSIS — H900 Conductive hearing loss, bilateral: Secondary | ICD-10-CM | POA: Diagnosis not present

## 2016-03-14 ENCOUNTER — Other Ambulatory Visit: Payer: Self-pay | Admitting: Adult Health

## 2016-05-01 ENCOUNTER — Ambulatory Visit (HOSPITAL_COMMUNITY): Payer: 59

## 2016-05-16 ENCOUNTER — Encounter (HOSPITAL_COMMUNITY): Payer: 59 | Attending: Oncology | Admitting: Oncology

## 2016-05-16 ENCOUNTER — Encounter (HOSPITAL_COMMUNITY): Payer: Self-pay

## 2016-05-16 VITALS — BP 109/81 | HR 72 | Temp 98.3°F | Resp 18 | Ht 67.0 in | Wt 214.5 lb

## 2016-05-16 DIAGNOSIS — Z86000 Personal history of in-situ neoplasm of breast: Secondary | ICD-10-CM

## 2016-05-16 DIAGNOSIS — Z800 Family history of malignant neoplasm of digestive organs: Secondary | ICD-10-CM

## 2016-05-16 DIAGNOSIS — D0501 Lobular carcinoma in situ of right breast: Secondary | ICD-10-CM

## 2016-05-16 DIAGNOSIS — Z87891 Personal history of nicotine dependence: Secondary | ICD-10-CM

## 2016-05-16 NOTE — Addendum Note (Signed)
Addended by: Twana First on: 05/16/2016 10:11 AM   Modules accepted: Level of Service

## 2016-05-16 NOTE — Progress Notes (Signed)
[place]  CONSULT NOTE  Patient Care Team: Rory Percy, MD as PCP - General (Family Medicine)  CHIEF COMPLAINTS/PURPOSE OF CONSULTATION:  LCIS  No history exists.    HISTORY OF PRESENTING ILLNESS:  [name] 46 y.o. female is here because of referral by Dr. Anastasio Champion for routine check up since she has a history of breast cancer.  Patient has a past medical history of R breast LCIS initially diagnosed in 2012. She has not seen an oncologist since November 2016.  As per Dr. Sydnee Cabal note on 01/09/2015: " Female with history of LCIS of the right breast, upper outer quadrant. This was found and treated in 2012. At the time of her right lumpectomy no residual LCIS was found only fibrocystic disease. She was placed on tamoxifen which she did not do well with. After long discussion last year we opted not to consider continuing that drug. She was much delighted with that decision.   Breast neoplasm, Tis (LCIS), right  11/19/2010 Biopsy  Core biopsies right lateral revealed LCIS ER +99%, PR +83%  12/20/2010 Surgery  Right lumpectomy reveals no residual LCIS, only fibrocystic changes  01/14/2011 - Hormone Therapy  Tamoxifen 10 mg twice a day initiated  07/20/2012 Adverse Reaction  Patient stops tamoxifen because of sensations of not feeling well, worsening hot flashes, increased appetite and weight, etc."    [name] presents today unaccompanied by for routine follow up for history of breast cancer.   She has followed up with oncology "every single time", but every time her doctor moves or changes so she is here today to resume oncology follow up. She notes her last oncology visit was with Dr. Darl Householder in November 2016.   Her last mammogram was in November 2017 with normal results. She notes she hasn't found any lumps or bumps on self examinations.   She notes she sometimes gets sharp pain in her pelvis which happens right before the start of her menstrual cycle.  She notes she has underwent an ultrasound for it in the past and was found to have a few ovarian cysts.   She denies, headache, sob, abdominal pain, and chest pain.   MEDICAL HISTORY:  Past Medical History:  Diagnosis Date  . Breast cancer ([place])   . Cancer ([place])    right lobular carcinoma in situ  . Gastroparesis    controlled    SURGICAL HISTORY: Past Surgical History:  Procedure Laterality Date  . BACK SURGERY  10/2002   lumbar hemilaminectomy, microdiscectomy L5-S1  . BREAST SURGERY    . MASTECTOMY, PARTIAL  12/19/2010   Procedure: MASTECTOMY PARTIAL;  Surgeon: Adin Hector, MD;  Location: [facility];  Service: General;  Laterality: Right;  right partial mastectomy and needle localization  . TUBAL LIGATION  2001    SOCIAL HISTORY: Social History   Social History  . Marital status: Married    Spouse name: N/A  . Number of children: N/A  . Years of education: N/A   Occupational History  . Not on file.   Social History Main Topics  . Smoking status: Former Smoker    Packs/day: 1.00    Years: 16.00    Quit date: 08/12/2000  . Smokeless tobacco: Never Used     Comment: quit 10 yrs  . Alcohol use Yes     Comment: occasionally  . Drug use: No  . Sexual activity: Yes   Other Topics Concern  . Not on file  Social History Narrative  . No narrative on file    FAMILY HISTORY: Family History  Problem Relation Age of Onset  . Hypertension Mother   . Hyperlipidemia Mother   . Heart disease Father     arrythmia  . Hypertension Father   . Cancer Maternal Uncle     pancreatic    ALLERGIES:  is allergic to amoxicillin-pot clavulanate.  MEDICATIONS:  No current outpatient prescriptions on file.   No current facility-administered medications for this visit.      Review of Systems  Constitutional: Negative.   HENT: Negative.   Eyes: Negative.   Respiratory: Negative.  Negative for shortness of breath.   Cardiovascular: Negative.   Negative for chest pain.  Gastrointestinal: Negative.  Negative for abdominal pain.  Genitourinary: Negative.   Musculoskeletal: Negative.        Intermittent sharp pelvic pain associated with ovarian cysts.  Skin: Negative.   Neurological: Negative.  Negative for headaches.  Endo/Heme/Allergies: Negative.   Psychiatric/Behavioral: Negative.   All other systems reviewed and are negative.  14 point ROS was done and is otherwise as detailed above or in HPI   PHYSICAL EXAMINATION: ECOG PERFORMANCE STATUS: 0 - Asymptomatic  Vitals:   05/16/16 0848  BP: 109/81  Pulse: 72  Resp: 18  Temp: 98.3 F (36.8 C)   Filed Weights   05/16/16 0848  Weight: 214 lb 8 oz (97.3 kg)    Physical Exam  Constitutional: She is oriented to person, place, and time and well-developed, well-nourished, and in no distress.  HENT:  Head: Normocephalic and atraumatic.  Eyes: Conjunctivae and EOM are normal. Pupils are equal, round, and reactive to light.  Neck: Normal range of motion. Neck supple.  Cardiovascular: Normal rate, regular rhythm and normal heart sounds.   Pulmonary/Chest: Effort normal and breath sounds normal. Right breast exhibits no inverted nipple, no mass, no nipple discharge, no skin change and no tenderness. Left breast exhibits no inverted nipple, no mass, no nipple discharge, no skin change and no tenderness. Breasts are symmetrical.  Abdominal: Soft. Bowel sounds are normal.  Musculoskeletal: Normal range of motion.  Neurological: She is alert and oriented to person, place, and time. Gait normal.  Skin: Skin is warm and dry.  Nursing note and vitals reviewed.     LABORATORY DATA:  I have reviewed the data as listed Lab Results  Component Value Date   WBC 7.0 09/28/2012   HGB 13.9 09/28/2012   HCT 41.0 09/28/2012   MCV 89.1 09/28/2012   PLT 240 09/28/2012   CMP     Component Value Date/Time   NA 137 09/28/2012 1325   NA 140 04/01/2012 1327   K 3.8 09/28/2012 1325    K 3.5 04/01/2012 1327   CL 103 09/28/2012 1325   CL 106 04/01/2012 1327   CO2 24 09/28/2012 1325   CO2 23 04/01/2012 1327   GLUCOSE 98 09/28/2012 1325   GLUCOSE 98 04/01/2012 1327   BUN 13 09/28/2012 1325   BUN 13.8 04/01/2012 1327   CREATININE 1.02 09/28/2012 1325   CREATININE 1.0 04/01/2012 1327   CALCIUM 9.1 09/28/2012 1325   CALCIUM 9.2 04/01/2012 1327   PROT 7.4 09/28/2012 1325   PROT 7.1 04/01/2012 1327   ALBUMIN 3.6 09/28/2012 1325   ALBUMIN 3.6 04/01/2012 1327   AST 20 09/28/2012 1325   AST 18 04/01/2012 1327   ALT 14 09/28/2012 1325   ALT 20 04/01/2012 1327   ALKPHOS 72 09/28/2012 1325   ALKPHOS  71 04/01/2012 1327   BILITOT 0.3 09/28/2012 1325   BILITOT 0.50 04/01/2012 1327   GFRNONAA 67 (L) 09/28/2012 1325   GFRAA 77 (L) 09/28/2012 1325     RADIOGRAPHIC STUDIES: I have personally reviewed the radiological images as listed and agreed with the findings in the report. No results found.  2D DIGITAL DIAGNOSTIC BILATERAL MAMMOGRAM WITH CAD AND ADJUNCT TOMO 01/09/2016  IMPRESSION: No evidence of malignancy in either breast.  ASSESSMENT & PLAN:  46 year old female with hx of LCIS of right breast s/p lumpectomy, she did not take adjuvant endocrine therapy with Tamoxifen due to concerns about potential side effects.   PLAN: Clinically NED on breast exam today. Last mammogram in Nov 2017 was negative. Continue yearly mammograms, next one due in Nov 2018.  I recommended she get a pap smear for screening for cervical cancer every three years. She is behind on her preventative screening and has not gone in over 3 years.  Dr. Nadara Mustard was considering placing patient on testosterone, however I would recommend against that since exogenous testosterone can be converted to estrogen by the body and increases her risk of breast cancer.   RTC in 1 year for follow up.   All questions were answered. The patient knows to call the clinic with any problems, questions or  concerns.  This document serves as a record of services personally performed by Twana First, MD. It was created on her behalf by Shirlean Mylar, a trained medical scribe. The creation of this record is based on the scribe's personal observations and the provider's statements to them. This document has been checked and approved by the attending provider.  I have reviewed the above documentation for accuracy and completeness and I agree with the above.  This note was electronically signed.    [employer]  05/16/2016 8:58 AM

## 2016-05-16 NOTE — Patient Instructions (Signed)
[place] at [facility] Discharge Instructions  RECOMMENDATIONS MADE BY THE CONSULTANT AND ANY TEST RESULTS WILL BE SENT TO YOUR REFERRING PHYSICIAN.  You were seen today by Dr. Twana First Follow up in year See Laelynn up front for appointments   Thank you for choosing [place] at [facility] to provide your oncology and hematology care.  To afford each patient quality time with our provider, please arrive at least 15 minutes before your scheduled appointment time.    If you have a lab appointment with the [facility] please come in thru the  Main Entrance and check in at the main information desk  You need to re-schedule your appointment should you arrive 10 or more minutes late.  We strive to give you quality time with our providers, and arriving late affects you and other patients whose appointments are after yours.  Also, if you no show three or more times for appointments you may be dismissed from the clinic at the providers discretion.     Again, thank you for choosing [facility].  Our hope is that these requests will decrease the amount of time that you wait before being seen by our physicians.       _____________________________________________________________  Should you have questions after your visit to [facility], please contact our office at [phone] between the hours of 8:30 a.m. and 4:30 p.m.  Voicemails left after 4:30 p.m. will not be returned until the following business day.  For prescription refill requests, have your pharmacy contact our office.       Resources For Cancer Patients and their Caregivers ? American Cancer Society: Can assist with transportation, wigs, general needs, runs Look Good Feel Better.        (725) 354-8237 ? Cancer Care: Provides financial assistance, online support groups, medication/co-pay assistance.  1-800-813-HOPE 204 692 2464) ? [place] Assists [place] cancer patients and their families through emotional , educational and financial support.  934-007-0494 ? [place] DSS Where to apply for food stamps, Medicaid and utility assistance. 803 843 6306 ? RCATS: Transportation to medical appointments. 251 784 4196 ? Social Security Administration: May apply for disability if have a Stage IV cancer. ([phone] 026-7336 762-168-0611 ? [employer], Disability and Transit Services: Assists with nutrition, care and transit needs. [place] Support Programs: @10RELATIVEDAYS @ > Cancer Support Group  2nd Tuesday of the month 1pm-2pm, Journey Room  > Creative Journey  3rd Tuesday of the month 1130am-1pm, Journey Room  > Look Good Feel Better  1st Wednesday of the month 10am-12 noon, Journey Room (Call [place] to register [phone])

## 2016-06-20 ENCOUNTER — Telehealth (HOSPITAL_COMMUNITY): Payer: Self-pay | Admitting: *Deleted

## 2016-06-20 NOTE — Telephone Encounter (Signed)
Spoke with patient via telephone. She called into office concerned about some pain she started having this morning. She woke up and had some tenderness to the right breast near her lumpectomy incision from 2012. Patient states that the pain/tenderness is more towards the axilla than the breast but she is concerned about it.  She performed a self breast exam and does not feel any lumps or bumps, just the pain.  She is calling asking what should she do next.

## 2016-06-20 NOTE — Telephone Encounter (Signed)
Can she come in tomorrow at 9:50 to see Dr. Talbert Cage?

## 2016-06-21 ENCOUNTER — Encounter (HOSPITAL_COMMUNITY): Payer: Self-pay

## 2016-06-21 ENCOUNTER — Encounter (HOSPITAL_COMMUNITY): Payer: 59 | Attending: Oncology | Admitting: Oncology

## 2016-06-21 VITALS — BP 120/84 | HR 67 | Temp 97.9°F | Resp 16 | Wt 214.6 lb

## 2016-06-21 DIAGNOSIS — N644 Mastodynia: Secondary | ICD-10-CM

## 2016-06-21 DIAGNOSIS — Z86000 Personal history of in-situ neoplasm of breast: Secondary | ICD-10-CM | POA: Diagnosis not present

## 2016-06-21 DIAGNOSIS — D0501 Lobular carcinoma in situ of right breast: Secondary | ICD-10-CM | POA: Insufficient documentation

## 2016-06-21 NOTE — Patient Instructions (Addendum)
[place] at [facility] Discharge Instructions  RECOMMENDATIONS MADE BY THE CONSULTANT AND ANY TEST RESULTS WILL BE SENT TO YOUR REFERRING PHYSICIAN.  You were seen today by Dr. Twana First We will get you a mammogram scheduled as soon as possible. Keep follow up appointments as previously scheduled.   Thank you for choosing [place] at [facility] to provide your oncology and hematology care.  To afford each patient quality time with our provider, please arrive at least 15 minutes before your scheduled appointment time.    If you have a lab appointment with the [facility] please come in thru the  Main Entrance and check in at the main information desk  You need to re-schedule your appointment should you arrive 10 or more minutes late.  We strive to give you quality time with our providers, and arriving late affects you and other patients whose appointments are after yours.  Also, if you no show three or more times for appointments you may be dismissed from the clinic at the providers discretion.     Again, thank you for choosing [facility].  Our hope is that these requests will decrease the amount of time that you wait before being seen by our physicians.       _____________________________________________________________  Should you have questions after your visit to [facility], please contact our office at [phone] between the hours of 8:30 a.m. and 4:30 p.m.  Voicemails left after 4:30 p.m. will not be returned until the following business day.  For prescription refill requests, have your pharmacy contact our office.       Resources For Cancer Patients and their Caregivers ? American Cancer Society: Can assist with transportation, wigs, general needs, runs Look Good Feel Better.        (916) 617-2523 ? Cancer Care: Provides financial assistance, online support groups, medication/co-pay assistance.   1-800-813-HOPE 414-359-3631) ? [place] Assists [place] cancer patients and their families through emotional , educational and financial support.  (202) 807-7454 ? [place] DSS Where to apply for food stamps, Medicaid and utility assistance. (415)142-1690 ? RCATS: Transportation to medical appointments. 916-453-9303 ? Social Security Administration: May apply for disability if have a Stage IV cancer. 320-277-0958 302-814-2385 ? [employer], Disability and Transit Services: Assists with nutrition, care and transit needs. [place] Support Programs: @10RELATIVEDAYS @ > Cancer Support Group  2nd Tuesday of the month 1pm-2pm, Journey Room  > Creative Journey  3rd Tuesday of the month 1130am-1pm, Journey Room  > Look Good Feel Better  1st Wednesday of the month 10am-12 noon, Journey Room (Call [place] to register [phone])

## 2016-06-21 NOTE — Progress Notes (Signed)
[place]  PROGRESS NOTE  Patient Care Team: Rory Percy, MD as PCP - General (Family Medicine)  CHIEF COMPLAINTS/PURPOSE OF CONSULTATION:  LCIS  HISTORY OF PRESENTING ILLNESS:  [name] 46 y.o. female is here because of referral by Dr. Anastasio Champion for routine check up since she has a history of breast cancer.  Patient has a past medical history of R breast LCIS initially diagnosed in 2012. She has not seen an oncologist since November 2016.  As per Dr. Sydnee Cabal note on 01/09/2015: " Female with history of LCIS of the right breast, upper outer quadrant. This was found and treated in 2012. At the time of her right lumpectomy no residual LCIS was found only fibrocystic disease. She was placed on tamoxifen which she did not do well with. After long discussion last year we opted not to consider continuing that drug. She was much delighted with that decision.   Breast neoplasm, Tis (LCIS), right  11/19/2010 Biopsy  Core biopsies right lateral revealed LCIS ER +99%, PR +83%  12/20/2010 Surgery  Right lumpectomy reveals no residual LCIS, only fibrocystic changes  01/14/2011 - Hormone Therapy  Tamoxifen 10 mg twice a day initiated  07/20/2012 Adverse Reaction  Patient stops tamoxifen because of sensations of not feeling well, worsening hot flashes, increased appetite and weight, etc."    Mrs. [name] presents today unaccompanied by for routine follow up for history of breast cancer.   She has followed up with oncology "every single time", but every time her doctor moves or changes so she is here today to resume oncology follow up. She notes her last oncology visit was with Dr. Darl Householder in November 2016.   Her last mammogram was in November 2017 with normal results. She notes she hasn't found any lumps or bumps on self examinations.   She notes she sometimes gets sharp pain in her pelvis which happens right before the start of her menstrual cycle. She notes she has  underwent an ultrasound for it in the past and was found to have a few ovarian cysts.   She denies, headache, sob, abdominal pain, and chest pain.  INTERVAL HISTORY: [name] returns to the clinic today for an unscheduled visit due to new symptoms. She reports she woke up yesterday with right sided breast tenderness near her axilla. She is "sore to the touch" but denies noting any mass. She denies any recent trauma which would cause this pain.  The patient denies any other new onset symptoms or concerns.   MEDICAL HISTORY:  Past Medical History:  Diagnosis Date  . Breast cancer ([place])   . Cancer ([place])    right lobular carcinoma in situ  . Gastroparesis    controlled    SURGICAL HISTORY: Past Surgical History:  Procedure Laterality Date  . BACK SURGERY  10/2002   lumbar hemilaminectomy, microdiscectomy L5-S1  . BREAST SURGERY    . MASTECTOMY, PARTIAL  12/19/2010   Procedure: MASTECTOMY PARTIAL;  Surgeon: Adin Hector, MD;  Location: [facility];  Service: General;  Laterality: Right;  right partial mastectomy and needle localization  . TUBAL LIGATION  2001    SOCIAL HISTORY: Social History   Social History  . Marital status: Married    Spouse name: N/A  . Number of children: N/A  . Years of education: N/A   Occupational History  . Not on file.   Social History Main Topics  . Smoking status: Former Smoker    Packs/day:  1.00    Years: 16.00    Quit date: 08/12/2000  . Smokeless tobacco: Never Used     Comment: quit 10 yrs  . Alcohol use Yes     Comment: occasionally  . Drug use: No  . Sexual activity: Yes   Other Topics Concern  . Not on file   Social History Narrative  . No narrative on file    FAMILY HISTORY: Family History  Problem Relation Age of Onset  . Hypertension Mother   . Hyperlipidemia Mother   . Heart disease Father        arrythmia  . Hypertension Father   . Cancer Maternal Uncle        pancreatic    ALLERGIES:  is  allergic to amoxicillin-pot clavulanate.  MEDICATIONS:  No current outpatient prescriptions on file.   No current facility-administered medications for this visit.      Review of Systems  Constitutional: Negative.   HENT: Negative.   Eyes: Negative.   Respiratory: Negative.  Negative for shortness of breath.   Cardiovascular: Negative.  Negative for chest pain.  Gastrointestinal: Negative.  Negative for abdominal pain.  Genitourinary: Negative.   Musculoskeletal: Negative.        Constant pain and soreness in the upper outer quadrant of the right breast near axilla, ongoing 1 day  Skin: Negative.   Neurological: Negative.  Negative for headaches.  Endo/Heme/Allergies: Negative.   Psychiatric/Behavioral: Negative.   All other systems reviewed and are negative.  14 point ROS was done and is otherwise as detailed above or in HPI   PHYSICAL EXAMINATION: ECOG PERFORMANCE STATUS: 0 - Asymptomatic  Vitals:   06/21/16 1009  BP: 120/84  Pulse: 67  Resp: 16  Temp: 97.9 F (36.6 C)   Filed Weights   06/21/16 1009  Weight: 214 lb 9.6 oz (97.3 kg)    Physical Exam  Constitutional: She is oriented to person, place, and time and well-developed, well-nourished, and in no distress.  HENT:  Head: Normocephalic and atraumatic.  Eyes: Conjunctivae and EOM are normal. Pupils are equal, round, and reactive to light.  Neck: Normal range of motion. Neck supple.  Cardiovascular: Normal rate, regular rhythm and normal heart sounds.   Pulmonary/Chest: Effort normal and breath sounds normal. Right breast exhibits tenderness. Right breast exhibits no inverted nipple, no mass, no nipple discharge and no skin change. Left breast exhibits no inverted nipple, no mass, no nipple discharge, no skin change and no tenderness. Breasts are symmetrical.    Abdominal: Soft. Bowel sounds are normal.  Musculoskeletal: Normal range of motion.  Neurological: She is alert and oriented to person, place, and  time. Gait normal.  Skin: Skin is warm and dry.  Nursing note and vitals reviewed.     LABORATORY DATA:  I have reviewed the data as listed Lab Results  Component Value Date   WBC 7.0 09/28/2012   HGB 13.9 09/28/2012   HCT 41.0 09/28/2012   MCV 89.1 09/28/2012   PLT 240 09/28/2012   CMP     Component Value Date/Time   NA 137 09/28/2012 1325   NA 140 04/01/2012 1327   K 3.8 09/28/2012 1325   K 3.5 04/01/2012 1327   CL 103 09/28/2012 1325   CL 106 04/01/2012 1327   CO2 24 09/28/2012 1325   CO2 23 04/01/2012 1327   GLUCOSE 98 09/28/2012 1325   GLUCOSE 98 04/01/2012 1327   BUN 13 09/28/2012 1325   BUN 13.8 04/01/2012 1327  CREATININE 1.02 09/28/2012 1325   CREATININE 1.0 04/01/2012 1327   CALCIUM 9.1 09/28/2012 1325   CALCIUM 9.2 04/01/2012 1327   PROT 7.4 09/28/2012 1325   PROT 7.1 04/01/2012 1327   ALBUMIN 3.6 09/28/2012 1325   ALBUMIN 3.6 04/01/2012 1327   AST 20 09/28/2012 1325   AST 18 04/01/2012 1327   ALT 14 09/28/2012 1325   ALT 20 04/01/2012 1327   ALKPHOS 72 09/28/2012 1325   ALKPHOS 71 04/01/2012 1327   BILITOT 0.3 09/28/2012 1325   BILITOT 0.50 04/01/2012 1327   GFRNONAA 67 (L) 09/28/2012 1325   GFRAA 77 (L) 09/28/2012 1325     RADIOGRAPHIC STUDIES: I have personally reviewed the radiological images as listed and agreed with the findings in the report. No results found.  2D DIGITAL DIAGNOSTIC BILATERAL MAMMOGRAM WITH CAD AND ADJUNCT TOMO 01/09/2016  IMPRESSION: No evidence of malignancy in either breast.  ASSESSMENT & PLAN:  46 year old female with hx of LCIS of right breast s/p lumpectomy, she did not take adjuvant endocrine therapy with Tamoxifen due to concerns about potential side effects.   PLAN: - On exam today patient shows no palpable masses or lymphadenopathy, though she is tender to palpation in the 10 o'clock position of the right breast, approximately 8 cm from the nipple and close to the axilla. - I will order a stat diagnostic  mammogram to rule out any concerns and alleviate the patient's anxiety. I will call the patient with these results. - Otherwise, patient will follow up in 1 year as previously scheduled.   All questions were answered. The patient knows to call the clinic with any problems, questions or concerns.  This document serves as a record of services personally performed by Twana First, MD. It was created on her behalf by Maryla Morrow, a trained medical scribe. The creation of this record is based on the scribe's personal observations and the provider's statements to them. This document has been checked and approved by the attending provider.  I have reviewed the above documentation for accuracy and completeness and I agree with the above.  This note was electronically signed by:  Twana First, MD  06/21/2016 10:16 AM

## 2016-06-24 ENCOUNTER — Other Ambulatory Visit: Payer: Self-pay | Admitting: Oncology

## 2016-06-24 DIAGNOSIS — N6310 Unspecified lump in the right breast, unspecified quadrant: Secondary | ICD-10-CM

## 2016-07-02 ENCOUNTER — Encounter (HOSPITAL_COMMUNITY): Payer: 59

## 2016-07-09 ENCOUNTER — Ambulatory Visit (HOSPITAL_COMMUNITY)
Admission: RE | Admit: 2016-07-09 | Discharge: 2016-07-09 | Disposition: A | Discharge status: Home / Self Care | Payer: 59 | Source: Ambulatory Visit | Attending: Oncology | Admitting: Oncology

## 2016-07-09 DIAGNOSIS — R921 Mammographic calcification found on diagnostic imaging of breast: Secondary | ICD-10-CM | POA: Diagnosis not present

## 2016-07-09 DIAGNOSIS — N6310 Unspecified lump in the right breast, unspecified quadrant: Secondary | ICD-10-CM | POA: Insufficient documentation

## 2016-07-09 DIAGNOSIS — D0501 Lobular carcinoma in situ of right breast: Secondary | ICD-10-CM

## 2016-07-10 ENCOUNTER — Other Ambulatory Visit (HOSPITAL_COMMUNITY): Payer: Self-pay | Admitting: Oncology

## 2016-07-10 ENCOUNTER — Other Ambulatory Visit (HOSPITAL_COMMUNITY): Payer: Self-pay

## 2016-07-10 DIAGNOSIS — D0501 Lobular carcinoma in situ of right breast: Secondary | ICD-10-CM

## 2016-07-11 ENCOUNTER — Other Ambulatory Visit (HOSPITAL_COMMUNITY): Payer: Self-pay | Admitting: Oncology

## 2016-07-11 DIAGNOSIS — D0501 Lobular carcinoma in situ of right breast: Secondary | ICD-10-CM

## 2016-07-12 HISTORY — PX: BREAST BIOPSY: SHX20

## 2016-07-22 ENCOUNTER — Ambulatory Visit
Admission: RE | Admit: 2016-07-22 | Discharge: 2016-07-22 | Disposition: A | Discharge status: Home / Self Care | Payer: 59 | Source: Ambulatory Visit | Attending: Oncology | Admitting: Oncology

## 2016-07-22 DIAGNOSIS — D0501 Lobular carcinoma in situ of right breast: Secondary | ICD-10-CM

## 2016-08-20 ENCOUNTER — Other Ambulatory Visit: Payer: Self-pay | Admitting: General Surgery

## 2016-08-20 DIAGNOSIS — D0501 Lobular carcinoma in situ of right breast: Secondary | ICD-10-CM

## 2016-09-01 ENCOUNTER — Ambulatory Visit
Admission: RE | Admit: 2016-09-01 | Discharge: 2016-09-01 | Disposition: A | Discharge status: Home / Self Care | Payer: 59 | Source: Ambulatory Visit | Attending: General Surgery | Admitting: General Surgery

## 2016-09-01 DIAGNOSIS — D0501 Lobular carcinoma in situ of right breast: Secondary | ICD-10-CM

## 2016-09-01 MED ORDER — GADOBENATE DIMEGLUMINE 529 MG/ML IV SOLN
19.0000 mL | Freq: Once | INTRAVENOUS | Status: AC | PRN
  Administered 2016-09-01: 19 mL via INTRAVENOUS

## 2016-09-02 ENCOUNTER — Other Ambulatory Visit: Payer: Self-pay | Admitting: General Surgery

## 2016-09-02 ENCOUNTER — Ambulatory Visit (INDEPENDENT_AMBULATORY_CARE_PROVIDER_SITE_OTHER): Payer: 59 | Admitting: Adult Health

## 2016-09-02 ENCOUNTER — Encounter: Payer: Self-pay | Admitting: Adult Health

## 2016-09-02 ENCOUNTER — Other Ambulatory Visit (HOSPITAL_COMMUNITY)
Admission: RE | Admit: 2016-09-02 | Discharge: 2016-09-02 | Disposition: A | Discharge status: Home / Self Care | Payer: 59 | Source: Ambulatory Visit | Attending: Adult Health | Admitting: Adult Health

## 2016-09-02 VITALS — BP 110/60 | HR 74 | Ht 66.5 in | Wt 218.0 lb

## 2016-09-02 DIAGNOSIS — Z1212 Encounter for screening for malignant neoplasm of rectum: Secondary | ICD-10-CM | POA: Insufficient documentation

## 2016-09-02 DIAGNOSIS — Z01419 Encounter for gynecological examination (general) (routine) without abnormal findings: Secondary | ICD-10-CM

## 2016-09-02 DIAGNOSIS — Z1231 Encounter for screening mammogram for malignant neoplasm of breast: Secondary | ICD-10-CM

## 2016-09-02 DIAGNOSIS — N393 Stress incontinence (female) (male): Secondary | ICD-10-CM

## 2016-09-02 DIAGNOSIS — C50911 Malignant neoplasm of unspecified site of right female breast: Secondary | ICD-10-CM | POA: Insufficient documentation

## 2016-09-02 DIAGNOSIS — R102 Pelvic and perineal pain: Secondary | ICD-10-CM

## 2016-09-02 DIAGNOSIS — Z1211 Encounter for screening for malignant neoplasm of colon: Secondary | ICD-10-CM

## 2016-09-02 DIAGNOSIS — Z853 Personal history of malignant neoplasm of breast: Secondary | ICD-10-CM | POA: Insufficient documentation

## 2016-09-02 LAB — HEMOCCULT GUIAC POC 1CARD (OFFICE): FECAL OCCULT BLD: NEGATIVE

## 2016-09-02 NOTE — Progress Notes (Signed)
Patient ID: [name], female   DOB: 1970-11-09, 46 y.o.   MRN: [id number] History of Present Illness: [name] is a 46 year old white female, married, G2P2 in for well woman gyn exam and pap, has been about 5 years.She sees Dr Dalbert Batman at [place] and has recurrent right breast cancer. PCP is [facility], and sees Dr Anastasio Champion too.    Current Medications, Allergies, Past Medical History, Past Surgical History, Family History and Social History were reviewed in [employer] record.     Review of Systems: Patient denies any headaches, hearing loss, fatigue, blurred vision, shortness of breath, chest pain, abdominal pain, problems with bowel movements, or intercourse. No joint pain. Leaks urine when coughs and sneezes.Some hot flashes, and moody at times.   Physical Exam:BP 110/60 (BP Location: Left Arm, Patient Position: Sitting, Cuff Size: Large)   Pulse 74   Ht 5' 6.5" (1.689 m)   Wt 218 lb (98.9 kg)   LMP 08/21/2016 (Exact Date)   BMI 34.66 kg/m  General:  Well developed, well nourished, no acute distress Skin:  Warm and dry Neck:  Midline trachea, normal thyroid, good ROM, no lymphadenopathy Lungs; Clear to auscultation bilaterally Breast:  No dominant palpable mass, retraction, or nipple discharge,has scar right breast Cardiovascular: Regular rate and rhythm Abdomen:  Soft, non tender, no hepatosplenomegaly Pelvic:  External genitalia is normal in appearance, no lesions.  The vagina is normal in appearance. Urethra has no lesions or masses. The cervix is bulbous.Pap with HPV performed.  Uterus is felt to be normal size, shape, and contour.uterus mildly tender.  No adnexal masses, + tenderness noted.R>L.Bladder is non tender, no masses felt. Rectal: Good sphincter tone, no polyps, or hemorrhoids felt.  Hemoccult negative. Extremities/musculoskeletal:  No swelling or varicosities noted, no clubbing or cyanosis Psych:  No mood changes, alert and cooperative,seems happy PHQ 2  score 0.  Impression: 1. Encounter for gynecological examination with Papanicolaou smear of cervix   2. History of right breast cancer   3. Screening for colorectal cancer   4. Recurrent breast cancer, right (HCC)   5. Pelvic pain   6. SUI (stress urinary incontinence, female)       Plan: Do kegels Limit caffeine  Physical in 1 year, pap in 3 if normal Return in about 1 week for GYN [place] Mammogram yearly Colonoscopy at [facility] with PCP

## 2016-09-04 LAB — CYTOLOGY - PAP
Diagnosis: NEGATIVE
HPV: NOT DETECTED

## 2016-09-05 ENCOUNTER — Ambulatory Visit
Admission: RE | Admit: 2016-09-05 | Discharge: 2016-09-05 | Disposition: A | Discharge status: Home / Self Care | Payer: 59 | Source: Ambulatory Visit | Attending: General Surgery | Admitting: General Surgery

## 2016-09-05 DIAGNOSIS — Z1231 Encounter for screening mammogram for malignant neoplasm of breast: Secondary | ICD-10-CM

## 2016-09-09 ENCOUNTER — Ambulatory Visit (INDEPENDENT_AMBULATORY_CARE_PROVIDER_SITE_OTHER): Payer: 59

## 2016-09-09 DIAGNOSIS — R102 Pelvic and perineal pain: Secondary | ICD-10-CM | POA: Diagnosis not present

## 2016-09-09 NOTE — Progress Notes (Signed)
Pelvic/TV ultrasound today.  Anteverted uterus measuring 8.7 x 5.2 x 6.0 cm. Right ovary appears normal and mobile, measuring 2.8 x 2.5 x 2.1 cm. Left ovary appears normal and mobile, measuring 2.9 x 1.6 x 1.5 cm. Small amount of free fluid in right posterior CDS. Endometrium is thickened and measures 14.5 mm.

## 2016-09-11 ENCOUNTER — Telehealth: Payer: Self-pay | Admitting: Adult Health

## 2016-09-11 NOTE — Telephone Encounter (Signed)
Pt aware pap negative for malignancy and HPV and US showed normal uterus and ovaries.

## 2016-09-26 ENCOUNTER — Other Ambulatory Visit: Payer: Self-pay | Admitting: General Surgery

## 2016-09-26 DIAGNOSIS — D0501 Lobular carcinoma in situ of right breast: Secondary | ICD-10-CM

## 2016-10-02 ENCOUNTER — Encounter (HOSPITAL_BASED_OUTPATIENT_CLINIC_OR_DEPARTMENT_OTHER): Payer: Self-pay | Admitting: *Deleted

## 2016-10-06 NOTE — H&P (Addendum)
[name] Location: [facility] Patient #: [id number] DOB: 10-Jan-1971 Married / Language: English / Race: White Female       History of Present Illness      The patient is a 46 year old female who presents with a complaint of recurrent lobular carcinoma in situ right breast. This is a pleasant 46 year old female who returns following breast MRI. She has recurrent LCIS right breast      In 2012 she underwent right breast lumpectomy with findings of non-pleomorphic lobular carcinoma in situ. She was sent to Dr. Humphrey Rolls who recommended tamoxifen as risk reducing strategy. She did not tolerate side effects. She says genetic testing was done and is negative. She is now followed by Dr. Talbert Cage at the [place] [facility]. On January 09, 2016 mammograms were normal. On May 26, 2016 she saw Dr. Talbert Cage in [place]  and was felt to be NAD. She developed pain in the right breast and on Jul 09, 2016 she had mammograms and ultrasound. There was no palpable abnormality but there was a 3 cm area of microcalcifications in the right breast, upper outer quadrant, middle to posterior depth somewhat in the area of the prior lumpectomy. Stereotactic biopsy showed non-pleomorphic LCIS.     We sent her for breast MRI which showed no evidence of malignancy     Comorbidities include LCIS with lumpectomy 2012. BMI 39. Otherwise healthy. Family history negative for breast, ovarian, or colon cancer.  Lives [place]. Denies tobacco. 4 glasses of wine a week.     We talked about her situation. I told her that she has a high risk lesion and there is a chance of invasive cancer at the present time which may approach 15%. We talked about long-term strategies. Both she and I prefer to just go ahead with lumpectomy and to see what the histology is. Neither she nor I think that it is a good idea to consider prophylactic mastectomy at this point, especially given the negative family history and  negative genetics.  After the upcoming lumpectomy is completed we will discuss long-term strategies for risk reduction and possible referral back to the high risk clinic.      She'll be scheduled for right breast lumpectomy with radioactive seed localization. I discussed the indications, details, techniques, and numerous risk of the surgery with her. She is aware of the risk of bleeding, infection, reoperation if cancer, cosmetic deformity, nerve damage with chronic pain, and other unforeseen problems. She understands all these issues well. This time all of her questions were answered. She agrees with this plan.     Allergies  Augmentin *PENICILLINS*   Medication History  No Current Medications Medications Reconciled  Vitals  Weight: 217.6 lb Height: 62in Body Surface Area: 1.98 m Body Mass Index: 39.8 kg/m  Temp.: 97.71F  Pulse: 89 (Regular)  BP: 110/80 (Sitting, Left Arm, Standard)    Physical Exam  General Mental Status-Alert. General Appearance-Not in acute distress. Build & Nutrition-Well nourished. Posture-Normal posture. Gait-Normal. Note: nursing staff present for exam   Head and Neck Head-normocephalic, atraumatic with no lesions or palpable masses. Trachea-midline. Thyroid Gland Characteristics - normal size and consistency and no palpable nodules.  Chest and Lung Exam Chest and lung exam reveals -on auscultation, normal breath sounds, no adventitious sounds and normal vocal resonance.  Breast Note: Bra size 36D by history. Breast large. Transverse lumpectomy scar 9 o'clock position right breast well-healed. No hematoma or ecchymoses. No axillary adenopathy.   Cardiovascular Cardiovascular  examination reveals -normal heart sounds, regular rate and rhythm with no murmurs and femoral artery auscultation bilaterally reveals normal pulses, no bruits, no thrills.  Abdomen Inspection Inspection of the abdomen reveals -  No Hernias. Palpation/Percussion Palpation and Percussion of the abdomen reveal - Soft, Non Tender, No Rigidity (guarding), No hepatosplenomegaly and No Palpable abdominal masses.  Neurologic Neurologic evaluation reveals -alert and oriented x 3 with no impairment of recent or remote memory, normal attention span and ability to concentrate, normal sensation and normal coordination.  Musculoskeletal Normal Exam - Bilateral-Upper Extremity Strength Normal and Lower Extremity Strength Normal.    Assessment & Plan LOBULAR CARCINOMA IN SITU OF RIGHT BREAST (D05.01)    You have recurrent lobular carcinoma in situ of the right breast, upper outer quadrant Fortunately, your breast MRI shows no evidence of malignancy There is still a small chance (15%) that you will have invasive cancer at this time Therefore, Dr. Dalbert Batman recommends a conservative right breast lumpectomy with radioactive seed localization After we see what the final pathology shows we can then discuss issues about your lifetime risk of cancer and possible referral back to the high risk breast clinic   youwill be scheduled for right breast lumpectomy with radioactive seed localization We have discussed the indications, techniques, and risk of the surgery in detail once again  BMI 39.0-39.9,ADULT (Z68.39)    Edsel Petrin. Dalbert Batman, M.D., [facility] General and Minimally invasive Surgery Breast and Colorectal Surgery Office:   [phone] Pager:   [phone]

## 2016-10-07 ENCOUNTER — Ambulatory Visit
Admission: RE | Admit: 2016-10-07 | Discharge: 2016-10-07 | Disposition: A | Discharge status: Home / Self Care | Payer: 59 | Source: Ambulatory Visit | Attending: General Surgery | Admitting: General Surgery

## 2016-10-07 DIAGNOSIS — D0501 Lobular carcinoma in situ of right breast: Secondary | ICD-10-CM

## 2016-10-07 NOTE — Progress Notes (Signed)
Ensure pre surgery drink given with instructions to complete by 0600 dos, pt verbalized understanding. 

## 2016-10-08 ENCOUNTER — Ambulatory Visit
Admission: RE | Admit: 2016-10-08 | Discharge: 2016-10-08 | Disposition: A | Discharge status: Home / Self Care | Payer: 59 | Source: Ambulatory Visit | Attending: General Surgery | Admitting: General Surgery

## 2016-10-08 ENCOUNTER — Encounter (HOSPITAL_BASED_OUTPATIENT_CLINIC_OR_DEPARTMENT_OTHER)
Admission: RE | Disposition: A | Discharge status: Home / Self Care | Payer: Self-pay | Source: Ambulatory Visit | Attending: General Surgery

## 2016-10-08 ENCOUNTER — Ambulatory Visit (HOSPITAL_BASED_OUTPATIENT_CLINIC_OR_DEPARTMENT_OTHER): Payer: 59 | Admitting: Anesthesiology

## 2016-10-08 ENCOUNTER — Encounter (HOSPITAL_BASED_OUTPATIENT_CLINIC_OR_DEPARTMENT_OTHER): Payer: Self-pay | Admitting: Anesthesiology

## 2016-10-08 ENCOUNTER — Ambulatory Visit (HOSPITAL_BASED_OUTPATIENT_CLINIC_OR_DEPARTMENT_OTHER)
Admission: RE | Admit: 2016-10-08 | Discharge: 2016-10-08 | Disposition: A | Discharge status: Home / Self Care | Payer: 59 | Source: Ambulatory Visit | Attending: General Surgery | Admitting: General Surgery

## 2016-10-08 DIAGNOSIS — D0591 Unspecified type of carcinoma in situ of right breast: Secondary | ICD-10-CM | POA: Diagnosis not present

## 2016-10-08 DIAGNOSIS — Z87891 Personal history of nicotine dependence: Secondary | ICD-10-CM | POA: Diagnosis not present

## 2016-10-08 DIAGNOSIS — N6091 Unspecified benign mammary dysplasia of right breast: Secondary | ICD-10-CM | POA: Insufficient documentation

## 2016-10-08 DIAGNOSIS — N6021 Fibroadenosis of right breast: Secondary | ICD-10-CM | POA: Diagnosis not present

## 2016-10-08 DIAGNOSIS — Z6834 Body mass index (BMI) 34.0-34.9, adult: Secondary | ICD-10-CM | POA: Insufficient documentation

## 2016-10-08 DIAGNOSIS — D0501 Lobular carcinoma in situ of right breast: Secondary | ICD-10-CM | POA: Diagnosis present

## 2016-10-08 HISTORY — PX: BREAST LUMPECTOMY WITH RADIOACTIVE SEED LOCALIZATION: SHX6424

## 2016-10-08 SURGERY — BREAST LUMPECTOMY WITH RADIOACTIVE SEED LOCALIZATION
Anesthesia: General | Site: Breast | Laterality: Right

## 2016-10-08 MED ORDER — GABAPENTIN 300 MG PO CAPS
300.0000 mg | ORAL_CAPSULE | ORAL | Status: AC
  Administered 2016-10-08: 300 mg via ORAL

## 2016-10-08 MED ORDER — DEXAMETHASONE SODIUM PHOSPHATE 10 MG/ML IJ SOLN
INTRAMUSCULAR | Status: AC
  Filled 2016-10-08: qty 1

## 2016-10-08 MED ORDER — CELECOXIB 400 MG PO CAPS
400.0000 mg | ORAL_CAPSULE | ORAL | Status: AC
  Administered 2016-10-08: 400 mg via ORAL

## 2016-10-08 MED ORDER — SCOPOLAMINE 1 MG/3DAYS TD PT72
MEDICATED_PATCH | TRANSDERMAL | Status: AC
  Filled 2016-10-08: qty 1

## 2016-10-08 MED ORDER — FENTANYL CITRATE (PF) 100 MCG/2ML IJ SOLN: 50.0000 ug | INTRAMUSCULAR | Status: DC | PRN

## 2016-10-08 MED ORDER — BUPIVACAINE-EPINEPHRINE (PF) 0.5% -1:200000 IJ SOLN
INTRAMUSCULAR | Status: DC | PRN
  Administered 2016-10-08: 10 mL

## 2016-10-08 MED ORDER — ACETAMINOPHEN 500 MG PO TABS
ORAL_TABLET | ORAL | Status: AC
  Filled 2016-10-08: qty 2

## 2016-10-08 MED ORDER — CEFAZOLIN SODIUM-DEXTROSE 2-4 GM/100ML-% IV SOLN
INTRAVENOUS | Status: AC
  Filled 2016-10-08: qty 100

## 2016-10-08 MED ORDER — SCOPOLAMINE 1 MG/3DAYS TD PT72: 1.0000 | MEDICATED_PATCH | Freq: Once | TRANSDERMAL | Status: DC | PRN

## 2016-10-08 MED ORDER — DEXAMETHASONE SODIUM PHOSPHATE 4 MG/ML IJ SOLN
INTRAMUSCULAR | Status: DC | PRN
  Administered 2016-10-08: 10 mg via INTRAVENOUS

## 2016-10-08 MED ORDER — LACTATED RINGERS IV SOLN
INTRAVENOUS | Status: DC
  Administered 2016-10-08 (×2): via INTRAVENOUS

## 2016-10-08 MED ORDER — GABAPENTIN 300 MG PO CAPS
ORAL_CAPSULE | ORAL | Status: AC
  Filled 2016-10-08: qty 1

## 2016-10-08 MED ORDER — MIDAZOLAM HCL 2 MG/2ML IJ SOLN
INTRAMUSCULAR | Status: AC
  Filled 2016-10-08: qty 2

## 2016-10-08 MED ORDER — ONDANSETRON HCL 4 MG/2ML IJ SOLN
INTRAMUSCULAR | Status: DC | PRN
  Administered 2016-10-08: 4 mg via INTRAVENOUS

## 2016-10-08 MED ORDER — LIDOCAINE 2% (20 MG/ML) 5 ML SYRINGE
INTRAMUSCULAR | Status: AC
  Filled 2016-10-08: qty 5

## 2016-10-08 MED ORDER — MIDAZOLAM HCL 2 MG/2ML IJ SOLN: 1.0000 mg | INTRAMUSCULAR | Status: DC | PRN

## 2016-10-08 MED ORDER — CHLORHEXIDINE GLUCONATE CLOTH 2 % EX PADS: 6.0000 | MEDICATED_PAD | Freq: Once | CUTANEOUS | Status: DC

## 2016-10-08 MED ORDER — CELECOXIB 200 MG PO CAPS
ORAL_CAPSULE | ORAL | Status: AC
Start: 2016-10-08 — End: 2016-10-08
  Filled 2016-10-08: qty 2

## 2016-10-08 MED ORDER — SCOPOLAMINE 1 MG/3DAYS TD PT72
MEDICATED_PATCH | TRANSDERMAL | Status: DC | PRN
  Administered 2016-10-08: 1 via TRANSDERMAL

## 2016-10-08 MED ORDER — PROPOFOL 10 MG/ML IV BOLUS
INTRAVENOUS | Status: DC | PRN
  Administered 2016-10-08: 200 mg via INTRAVENOUS

## 2016-10-08 MED ORDER — PROPOFOL 10 MG/ML IV BOLUS
INTRAVENOUS | Status: AC
  Filled 2016-10-08: qty 20

## 2016-10-08 MED ORDER — ONDANSETRON HCL 4 MG/2ML IJ SOLN
INTRAMUSCULAR | Status: AC
  Filled 2016-10-08: qty 2

## 2016-10-08 MED ORDER — HYDROCODONE-ACETAMINOPHEN 5-325 MG PO TABS: 1.0000 | ORAL_TABLET | Freq: Four times a day (QID) | ORAL | 0 refills | Status: DC | PRN

## 2016-10-08 MED ORDER — FENTANYL CITRATE (PF) 100 MCG/2ML IJ SOLN
INTRAMUSCULAR | Status: DC | PRN
  Administered 2016-10-08: 100 ug via INTRAVENOUS
  Administered 2016-10-08: 25 ug via INTRAVENOUS

## 2016-10-08 MED ORDER — MIDAZOLAM HCL 5 MG/5ML IJ SOLN
INTRAMUSCULAR | Status: DC | PRN
  Administered 2016-10-08: 2 mg via INTRAVENOUS

## 2016-10-08 MED ORDER — FENTANYL CITRATE (PF) 100 MCG/2ML IJ SOLN
INTRAMUSCULAR | Status: AC
  Filled 2016-10-08: qty 2

## 2016-10-08 MED ORDER — SCOPOLAMINE 1 MG/3DAYS TD PT72
1.0000 | MEDICATED_PATCH | TRANSDERMAL | Status: DC
  Administered 2016-10-08: 1.5 mg via TRANSDERMAL

## 2016-10-08 MED ORDER — HYDROMORPHONE HCL 1 MG/ML IJ SOLN
0.2500 mg | INTRAMUSCULAR | Status: DC | PRN
  Administered 2016-10-08: 0.25 mg via INTRAVENOUS

## 2016-10-08 MED ORDER — CEFAZOLIN SODIUM-DEXTROSE 2-4 GM/100ML-% IV SOLN
2.0000 g | INTRAVENOUS | Status: AC
  Administered 2016-10-08: 2 g via INTRAVENOUS

## 2016-10-08 MED ORDER — LIDOCAINE HCL (CARDIAC) 20 MG/ML IV SOLN
INTRAVENOUS | Status: DC | PRN
  Administered 2016-10-08: 30 mg via INTRAVENOUS

## 2016-10-08 MED ORDER — PROMETHAZINE HCL 25 MG/ML IJ SOLN: 6.2500 mg | INTRAMUSCULAR | Status: DC | PRN

## 2016-10-08 MED ORDER — HYDROMORPHONE HCL 1 MG/ML IJ SOLN
INTRAMUSCULAR | Status: AC
  Filled 2016-10-08: qty 0.5

## 2016-10-08 MED ORDER — ACETAMINOPHEN 500 MG PO TABS
1000.0000 mg | ORAL_TABLET | ORAL | Status: AC
  Administered 2016-10-08: 1000 mg via ORAL

## 2016-10-08 SURGICAL SUPPLY — 50 items
APPLIER CLIP 9.375 MED OPEN (MISCELLANEOUS) ×3
BINDER BREAST XLRG (GAUZE/BANDAGES/DRESSINGS) ×3 IMPLANT
BINDER BREAST XXLRG (GAUZE/BANDAGES/DRESSINGS) IMPLANT
BLADE HEX COATED 2.75 (ELECTRODE) ×3 IMPLANT
BLADE SURG 15 STRL LF DISP TIS (BLADE) ×1 IMPLANT
BLADE SURG 15 STRL SS (BLADE) ×2
CANISTER SUC SOCK COL 7IN (MISCELLANEOUS) IMPLANT
CANISTER SUCT 1200ML W/VALVE (MISCELLANEOUS) ×3 IMPLANT
CHLORAPREP W/TINT 26ML (MISCELLANEOUS) ×3 IMPLANT
CLIP APPLIE 9.375 MED OPEN (MISCELLANEOUS) ×1 IMPLANT
COVER BACK TABLE 60X90IN (DRAPES) ×3 IMPLANT
COVER MAYO STAND STRL (DRAPES) ×3 IMPLANT
COVER PROBE W GEL 5X96 (DRAPES) ×3 IMPLANT
DERMABOND ADVANCED (GAUZE/BANDAGES/DRESSINGS) ×2
DERMABOND ADVANCED .7 DNX12 (GAUZE/BANDAGES/DRESSINGS) ×1 IMPLANT
DEVICE DUBIN W/COMP PLATE 8390 (MISCELLANEOUS) ×3 IMPLANT
DRAPE LAPAROSCOPIC ABDOMINAL (DRAPES) ×3 IMPLANT
DRAPE UTILITY XL STRL (DRAPES) ×3 IMPLANT
DRSG PAD ABDOMINAL 8X10 ST (GAUZE/BANDAGES/DRESSINGS) ×3 IMPLANT
ELECT REM PT RETURN 9FT ADLT (ELECTROSURGICAL) ×3
ELECTRODE REM PT RTRN 9FT ADLT (ELECTROSURGICAL) ×1 IMPLANT
GAUZE SPONGE 4X4 12PLY STRL LF (GAUZE/BANDAGES/DRESSINGS) ×3 IMPLANT
GLOVE EUDERMIC 7 POWDERFREE (GLOVE) ×3 IMPLANT
GOWN STRL REUS W/ TWL LRG LVL3 (GOWN DISPOSABLE) ×1 IMPLANT
GOWN STRL REUS W/ TWL XL LVL3 (GOWN DISPOSABLE) ×1 IMPLANT
GOWN STRL REUS W/TWL LRG LVL3 (GOWN DISPOSABLE) ×2
GOWN STRL REUS W/TWL XL LVL3 (GOWN DISPOSABLE) ×2
ILLUMINATOR WAVEGUIDE N/F (MISCELLANEOUS) IMPLANT
KIT MARKER MARGIN INK (KITS) ×3 IMPLANT
LIGHT WAVEGUIDE WIDE FLAT (MISCELLANEOUS) IMPLANT
NEEDLE HYPO 25X1 1.5 SAFETY (NEEDLE) ×3 IMPLANT
NS IRRIG 1000ML POUR BTL (IV SOLUTION) ×3 IMPLANT
PACK BASIN DAY SURGERY FS (CUSTOM PROCEDURE TRAY) ×3 IMPLANT
PENCIL BUTTON HOLSTER BLD 10FT (ELECTRODE) ×3 IMPLANT
SLEEVE SCD COMPRESS KNEE MED (MISCELLANEOUS) ×3 IMPLANT
SPONGE LAP 4X18 X RAY DECT (DISPOSABLE) ×3 IMPLANT
SUT ETHILON 3 0 FSL (SUTURE) IMPLANT
SUT MNCRL AB 4-0 PS2 18 (SUTURE) ×3 IMPLANT
SUT SILK 2 0 SH (SUTURE) ×3 IMPLANT
SUT VIC AB 2-0 CT1 27 (SUTURE)
SUT VIC AB 2-0 CT1 TAPERPNT 27 (SUTURE) IMPLANT
SUT VIC AB 3-0 SH 27 (SUTURE)
SUT VIC AB 3-0 SH 27X BRD (SUTURE) IMPLANT
SUT VICRYL 3-0 CR8 SH (SUTURE) ×3 IMPLANT
SYR 10ML LL (SYRINGE) ×3 IMPLANT
TOWEL OR 17X24 6PK STRL BLUE (TOWEL DISPOSABLE) ×3 IMPLANT
TOWEL OR NON WOVEN STRL DISP B (DISPOSABLE) ×3 IMPLANT
TUBE CONNECTING 20'X1/4 (TUBING) ×1
TUBE CONNECTING 20X1/4 (TUBING) ×2 IMPLANT
YANKAUER SUCT BULB TIP NO VENT (SUCTIONS) ×3 IMPLANT

## 2016-10-08 NOTE — Anesthesia Procedure Notes (Signed)
Procedure Name: LMA Insertion Date/Time: 10/08/2016 9:34 AM Performed by: Toula Moos L Pre-anesthesia Checklist: Patient identified, Emergency Drugs available, Suction available, Patient being monitored and Timeout performed Patient Re-evaluated:Patient Re-evaluated prior to induction Oxygen Delivery Method: Circle system utilized Preoxygenation: Pre-oxygenation with 100% oxygen Induction Type: IV induction Ventilation: Mask ventilation without difficulty LMA: LMA inserted LMA Size: 4.0 Number of attempts: 1 Airway Equipment and Method: Bite block Placement Confirmation: positive ETCO2 Tube secured with: Tape Dental Injury: Teeth and Oropharynx as per pre-operative assessment

## 2016-10-08 NOTE — Op Note (Signed)
Patient Name:           [name]   Date of Surgery:        10/08/2016  Pre op Diagnosis:      Recurrent lobular carcinoma in situ right breast, upper outer quadrant  Post op Diagnosis:    Same  Procedure:                 Right breast lumpectomy with radioactive seed localization and margin assessment  Surgeon:                     Edsel Petrin. Dalbert Batman, M.D., FACS  Assistant:                      OR staff   Indication for Assistant: N/A  Operative Indications:      . This is a pleasant 46 year old female who recently had a breast MRI. She has recurrent LCIS right breast      In 2012 she underwent right breast lumpectomy with findings of non-pleomorphic lobular carcinoma in situ. She was sent to Dr. Humphrey Rolls who recommended tamoxifen as risk reducing strategy. She did not tolerate side effects. She says genetic testing was done and is negative. She is now followed by Dr. Talbert Cage at the [facility]. On January 09, 2016 mammograms were normal. On May 26, 2016 she saw Dr. Talbert Cage in [place]  and was felt to be NAD. She developed pain in the right breast and on Jul 09, 2016 she had mammograms and ultrasound. There was no palpable abnormality but there was a 3 cm area of microcalcifications in the right breast, upper outer quadrant, middle to posterior depth somewhat in the area of the prior lumpectomy. Stereotactic biopsy showed non-pleomorphic LCIS.     We sent her for breast MRI which showed no evidence of malignancy     Comorbidities include LCIS with lumpectomy 2012. BMI 39. Otherwise healthy. Family history negative for breast, ovarian, or colon cancer. .     We talked about her situation. I told her that she has a high risk lesion and there is a chance of invasive cancer at the present time which may approach 15%. We talked about long-term strategies. Both she and I prefer to just go ahead with lumpectomy and to see what the histology is. Neither she nor I think that it  is a good idea to consider prophylactic mastectomy at this point, especially given the negative family history and negative genetics.  After the upcoming lumpectomy is completed we will discuss long-term strategies for risk reduction and possible referral back to the high risk clinic.      She'll be scheduled for right breast lumpectomy with radioactive seed localization.  She agrees with this plan.  Operative Findings:       The biopsy clip and radioactive seed were relatively close to each other in the upper outer quadrant of the right breast, middle depth.  The tissues were somewhat scarred from previous biopsy.  There was no gross abnormality to suggest invasive malignancy.  The specimen mammogram looked good containing the radioactive seed and the marker clip and looked centered in the specimen          Following the induction of general LMA anesthesia the patient's right breast was prepped and draped in a sterile fashion.  Surgical timeout was performed.  Intravenous antibiotics were given.  0.5% Marcaine with epinephrine was used as a local infiltration  anesthetic.  Using the neoprobe I planned her incision.  I made a curvilinear incision as far lateral as I could.  The lumpectomy was performed using the electrocautery and neoprobe.  The specimen was removed and marked with silk sutures and a 6 color ink kit to orient the pathologist.  Specimen mammogram looked good as described above.  The specimen was marked and sent to the lab.  Hemostasis was excellent.  The wound was irrigated.  The lumpectomy cavity walls were marked with 5 metal clips according to our protocol.  The breast tissues were reapproximated with interrupted sutures of 3-0 Vicryl and the skin closed with a running subcuticular  Suture of 4-0 Monocryl and Dermabond.  Breast binder was placed in the patient taken to PACU in stable condition.  EBL 10-15 mL.  Counts correct.  Complications none.     Edsel Petrin. Dalbert Batman, M.D.,  FACS General and Minimally Invasive Surgery Breast and Colorectal Surgery   Addendum: I logged on to the [facility] website and reviewed her prescription medication history.     10/08/2016 10:18 AM

## 2016-10-08 NOTE — Anesthesia Preprocedure Evaluation (Addendum)
Anesthesia Evaluation  Patient identified by MRN, date of birth, ID band Patient awake    Reviewed: Allergy & Precautions, Patient's Chart, lab work & pertinent test results  History of Anesthesia Complications Negative for: history of anesthetic complications  Airway Mallampati: II  TM Distance: >3 FB Neck ROM: Full    Dental no notable dental hx. (+) Dental Advisory Given   Pulmonary neg pulmonary ROS, former smoker,    Pulmonary exam normal        Cardiovascular negative cardio ROS Normal cardiovascular exam     Neuro/Psych negative neurological ROS     GI/Hepatic negative GI ROS, Neg liver ROS,   Endo/Other  Morbid obesity  Renal/GU negative Renal ROS     Musculoskeletal negative musculoskeletal ROS (+)   Abdominal   Peds  Hematology negative hematology ROS (+)   Anesthesia Other Findings Day of surgery medications reviewed with the patient.  Reproductive/Obstetrics                            Anesthesia Physical Anesthesia Plan  ASA: II  Anesthesia Plan: General   Post-op Pain Management:    Induction: Intravenous  PONV Risk Score and Plan: 3 and Ondansetron, Dexamethasone and Scopolamine patch - Pre-op  Airway Management Planned: LMA  Additional Equipment:   Intra-op Plan:   Post-operative Plan: Extubation in OR  Informed Consent: I have reviewed the patients History and Physical, chart, labs and discussed the procedure including the risks, benefits and alternatives for the proposed anesthesia with the patient or authorized representative who has indicated his/her understanding and acceptance.   Dental advisory given  Plan Discussed with: CRNA, Anesthesiologist and Surgeon  Anesthesia Plan Comments:       Anesthesia Quick Evaluation

## 2016-10-08 NOTE — Discharge Instructions (Signed)
[facility] °Office Phone Number [phone] ° °BREAST BIOPSY/ PARTIAL MASTECTOMY: POST OP INSTRUCTIONS ° °Always review your discharge instruction sheet given to you by the facility where your surgery was performed. ° °IF YOU HAVE DISABILITY OR FAMILY LEAVE FORMS, YOU MUST BRING THEM TO THE OFFICE FOR PROCESSING.  DO NOT GIVE THEM TO YOUR DOCTOR. ° °1. A prescription for pain medication may be given to you upon discharge.  Take your pain medication as prescribed, if needed.  If narcotic pain medicine is not needed, then you may take acetaminophen (Tylenol) or ibuprofen (Advil) as needed. °2. Take your usually prescribed medications unless otherwise directed °3. If you need a refill on your pain medication, please contact your pharmacy.  They will contact our office to request authorization.  Prescriptions will not be filled after 5pm or on week-ends. °4. You should eat very light the first 24 hours after surgery, such as soup, crackers, pudding, etc.  Resume your normal diet the day after surgery. °5. Most patients will experience some swelling and bruising in the breast.  Ice packs and a good support bra will help.  Swelling and bruising can take several days to resolve.  °6. It is common to experience some constipation if taking pain medication after surgery.  Increasing fluid intake and taking a stool softener will usually help or prevent this problem from occurring.  A mild laxative (Milk of Magnesia or Miralax) should be taken according to package directions if there are no bowel movements after 48 hours. °7. Unless discharge instructions indicate otherwise, you may remove your bandages 24-48 hours after surgery, and you may shower at that time.  You may have steri-strips (small skin tapes) in place directly over the incision.  These strips should be left on the skin for 7-10 days.  If your surgeon used skin glue on the incision, you may shower in 24 hours.  The glue will flake off over the  next 2-3 weeks.  Any sutures or staples will be removed at the office during your follow-up visit. °8. ACTIVITIES:  You may resume regular daily activities (gradually increasing) beginning the next day.  Wearing a good support bra or sports bra minimizes pain and swelling.  You may have sexual intercourse when it is comfortable. °a. You may drive when you no longer are taking prescription pain medication, you can comfortably wear a seatbelt, and you can safely maneuver your car and apply brakes. °b. RETURN TO WORK:  ______________________________________________________________________________________ °9. You should see your doctor in the office for a follow-up appointment approximately two weeks after your surgery.  Your doctor’s nurse will typically make your follow-up appointment when she calls you with your pathology report.  Expect your pathology report 2-3 business days after your surgery.  You may call to check if you do not hear from us after three days. °10. OTHER INSTRUCTIONS: _______________________________________________________________________________________________ _____________________________________________________________________________________________________________________________________ °_____________________________________________________________________________________________________________________________________ °_____________________________________________________________________________________________________________________________________ ° °WHEN TO CALL YOUR DOCTOR: °1. Fever over 101.0 °2. Nausea and/or vomiting. °3. Extreme swelling or bruising. °4. Continued bleeding from incision. °5. Increased pain, redness, or drainage from the incision. ° °The clinic staff is available to answer your questions during regular business hours.  Please don’t hesitate to call and ask to speak to one of the nurses for clinical concerns.  If you have a medical emergency, go to the nearest  emergency room or call 911.  A surgeon from [facility] is always on call at the hospital. ° °For further questions, please visit [email or web address]  ° ° ° ° °  Post Anesthesia Home Care Instructions ° °Activity: °Get plenty of rest for the remainder of the day. A responsible individual must stay with you for 24 hours following the procedure.  °For the next 24 hours, DO NOT: °-Drive a car °-Operate machinery °-Drink alcoholic beverages °-Take any medication unless instructed by your physician °-Make any legal decisions or sign important papers. ° °Meals: °Start with liquid foods such as gelatin or soup. Progress to regular foods as tolerated. Avoid greasy, spicy, heavy foods. If nausea and/or vomiting occur, drink only clear liquids until the nausea and/or vomiting subsides. Call your physician if vomiting continues. ° °Special Instructions/Symptoms: °Your throat may feel dry or sore from the anesthesia or the breathing tube placed in your throat during surgery. If this causes discomfort, gargle with warm salt water. The discomfort should disappear within 24 hours. ° °If you had a scopolamine patch placed behind your ear for the management of post- operative nausea and/or vomiting: ° °1. The medication in the patch is effective for 72 hours, after which it should be removed.  Wrap patch in a tissue and discard in the trash. Wash hands thoroughly with soap and water. °2. You may remove the patch earlier than 72 hours if you experience unpleasant side effects which may include dry mouth, dizziness or visual disturbances. °3. Avoid touching the patch. Wash your hands with soap and water after contact with the patch. °  ° °

## 2016-10-08 NOTE — Anesthesia Postprocedure Evaluation (Signed)
Anesthesia Post Note  Patient: [name]  Procedure(s) Performed: Procedure(s) (LRB): RIGHT BREAST LUMPECTOMY WITH RADIOACTIVE SEED LOCALIZATION (Right)     Patient location during evaluation: PACU Anesthesia Type: General Level of consciousness: sedated Pain management: pain level controlled Vital Signs Assessment: post-procedure vital signs reviewed and stable Respiratory status: spontaneous breathing and respiratory function stable Cardiovascular status: stable Anesthetic complications: no    Last Vitals:  Vitals:   10/08/16 1045 10/08/16 1108  BP: 120/79 133/71  Pulse: 86 81  Resp: 16 16  Temp:  36.7 C  SpO2: 99% 99%    Last Pain:  Vitals:   10/08/16 1108  TempSrc:   PainSc: 1                  Aalaysia Liggins DANIEL

## 2016-10-08 NOTE — Transfer of Care (Signed)
Immediate Anesthesia Transfer of Care Note  Patient: [name]  Procedure(s) Performed: Procedure(s): RIGHT BREAST LUMPECTOMY WITH RADIOACTIVE SEED LOCALIZATION (Right)  Patient Location: PACU  Anesthesia Type:General  Level of Consciousness: awake and patient cooperative  Airway & Oxygen Therapy: Patient Spontanous Breathing and Patient connected to face mask oxygen  Post-op Assessment: Report given to RN and Post -op Vital signs reviewed and stable  Post vital signs: Reviewed and stable  Last Vitals:  Vitals:   10/08/16 0915  BP: 117/74  Pulse: 71  Resp: 16  Temp: 36.5 C  SpO2: 100%    Last Pain:  Vitals:   10/08/16 0915  TempSrc: Oral         Complications: No apparent anesthesia complications

## 2016-10-09 ENCOUNTER — Encounter (HOSPITAL_BASED_OUTPATIENT_CLINIC_OR_DEPARTMENT_OTHER): Payer: Self-pay | Admitting: General Surgery

## 2016-12-13 ENCOUNTER — Ambulatory Visit (HOSPITAL_COMMUNITY): Payer: 59

## 2017-01-07 ENCOUNTER — Encounter (HOSPITAL_COMMUNITY): Payer: 59

## 2017-01-07 ENCOUNTER — Encounter (HOSPITAL_COMMUNITY): Payer: Self-pay

## 2017-01-07 ENCOUNTER — Other Ambulatory Visit: Payer: Self-pay

## 2017-01-07 ENCOUNTER — Encounter (HOSPITAL_COMMUNITY): Payer: 59 | Attending: Oncology | Admitting: Oncology

## 2017-01-07 VITALS — BP 120/79 | HR 77 | Temp 98.3°F | Resp 18 | Ht 67.0 in | Wt 219.5 lb

## 2017-01-07 DIAGNOSIS — R1310 Dysphagia, unspecified: Secondary | ICD-10-CM | POA: Diagnosis not present

## 2017-01-07 DIAGNOSIS — C50911 Malignant neoplasm of unspecified site of right female breast: Secondary | ICD-10-CM

## 2017-01-07 DIAGNOSIS — D0501 Lobular carcinoma in situ of right breast: Secondary | ICD-10-CM

## 2017-01-07 LAB — CBC WITH DIFFERENTIAL/PLATELET
Basophils Absolute: 0 10*3/uL (ref 0.0–0.1)
Basophils Relative: 1 %
Eosinophils Absolute: 0.2 10*3/uL (ref 0.0–0.7)
Eosinophils Relative: 3 %
HEMATOCRIT: 45.5 % (ref 36.0–46.0)
HEMOGLOBIN: 14.8 g/dL (ref 12.0–15.0)
LYMPHS ABS: 3 10*3/uL (ref 0.7–4.0)
Lymphocytes Relative: 37 %
MCH: 30 pg (ref 26.0–34.0)
MCHC: 32.5 g/dL (ref 30.0–36.0)
MCV: 92.1 fL (ref 78.0–100.0)
MONO ABS: 0.4 10*3/uL (ref 0.1–1.0)
MONOS PCT: 4 %
NEUTROS ABS: 4.5 10*3/uL (ref 1.7–7.7)
NEUTROS PCT: 55 %
Platelets: 262 10*3/uL (ref 150–400)
RBC: 4.94 MIL/uL (ref 3.87–5.11)
RDW: 12.1 % (ref 11.5–15.5)
WBC: 8.2 10*3/uL (ref 4.0–10.5)

## 2017-01-07 LAB — COMPREHENSIVE METABOLIC PANEL
ALK PHOS: 56 U/L (ref 38–126)
ALT: 16 U/L (ref 14–54)
ANION GAP: 7 (ref 5–15)
AST: 17 U/L (ref 15–41)
Albumin: 4.2 g/dL (ref 3.5–5.0)
BILIRUBIN TOTAL: 0.5 mg/dL (ref 0.3–1.2)
BUN: 11 mg/dL (ref 6–20)
CALCIUM: 9.5 mg/dL (ref 8.9–10.3)
CO2: 27 mmol/L (ref 22–32)
Chloride: 107 mmol/L (ref 101–111)
Creatinine, Ser: 0.95 mg/dL (ref 0.44–1.00)
GFR calc non Af Amer: >60 mL/min (ref >60)
Glucose, Bld: 100 mg/dL — ABNORMAL HIGH (ref 65–99)
Potassium: 3.8 mmol/L (ref 3.5–5.1)
Sodium: 141 mmol/L (ref 135–145)
Total Protein: 7.6 g/dL (ref 6.5–8.1)

## 2017-01-07 MED ORDER — TAMOXIFEN CITRATE 20 MG PO TABS: 20.0000 mg | ORAL_TABLET | Freq: Every day | ORAL | 1 refills | Status: DC

## 2017-01-07 NOTE — Progress Notes (Signed)
[facility]  PROGRESS NOTE  Patient Care Team: Rory Percy, MD as PCP - General (Family Medicine)  CHIEF COMPLAINTS/PURPOSE OF CONSULTATION:  LCIS  HISTORY OF PRESENTING ILLNESS:  [name] 46 y.o. female is here because of referral by Dr. Anastasio Champion for routine check up since she has a history of breast cancer.  Patient has a past medical history of R breast LCIS initially diagnosed in 2012. She has not seen an oncologist since November 2016.  As per Dr. Sydnee Cabal note on 01/09/2015: " Female with history of LCIS of the right breast, upper outer quadrant. This was found and treated in 2012. At the time of her right lumpectomy no residual LCIS was found only fibrocystic disease. She was placed on tamoxifen which she did not do well with. After long discussion last year we opted not to consider continuing that drug. She was much delighted with that decision.   Breast neoplasm, Tis (LCIS), right  11/19/2010 Biopsy  Core biopsies right lateral revealed LCIS ER +99%, PR +83%  12/20/2010 Surgery  Right lumpectomy reveals no residual LCIS, only fibrocystic changes  01/14/2011 - Hormone Therapy  Tamoxifen 10 mg twice a day initiated  07/20/2012 Adverse Reaction  Patient stops tamoxifen because of sensations of not feeling well, worsening hot flashes, increased appetite and weight, etc."     INTERVAL HISTORY:  Patient presents today for continued follow-up.  She had recurrent LCIS in the right upper outer quadrant of her breast.  On 09/30/2016 she underwent a right breast lumpectomy with radioactive seed localization and margin assessment by Dr. Dalbert Batman.  She has done well postop and has not had any issues.  She has not palpated any new masses on her breasts.  She denies any chest pain, headaches, abdominal pain, nausea, vomiting, diarrhea, focal weakness.  She states that she been noting some dysphagia to solids and sometimes liquids.  She states she has a family history of  esophageal narrowing that required dilation, as well as a history of GERD in the family.  She denies any changes in appetite or weight loss.    MEDICAL HISTORY:  Past Medical History:  Diagnosis Date  . Breast cancer ([place]) 2012   rt breast  . Breast disorder    cancer right breast  . Cancer ([place])    right lobular carcinoma in situ  . Gastroparesis    controlled    SURGICAL HISTORY: Past Surgical History:  Procedure Laterality Date  . BACK SURGERY  10/2002   lumbar hemilaminectomy, microdiscectomy L5-S1  . BREAST BIOPSY Right 07/2016   malignant; waiting on MRI results  . BREAST LUMPECTOMY WITH RADIOACTIVE SEED LOCALIZATION Right 10/08/2016   Procedure: RIGHT BREAST LUMPECTOMY WITH RADIOACTIVE SEED LOCALIZATION;  Surgeon: Fanny Skates, MD;  Location: [facility];  Service: General;  Laterality: Right;  . BREAST SURGERY    . MASTECTOMY, PARTIAL Right 12/19/2010   Procedure: MASTECTOMY PARTIAL;  Surgeon: Adin Hector, MD;  Location: [facility];  Service: General;  Laterality: Right;  right partial mastectomy and needle localization  . TUBAL LIGATION  2001    SOCIAL HISTORY: Social History   Socioeconomic History  . Marital status: Married    Spouse name: Not on file  . Number of children: Not on file  . Years of education: Not on file  . Highest education level: Not on file  Social Needs  . Financial resource strain: Not on file  . Food insecurity - worry: Not  on file  . Food insecurity - inability: Not on file  . Transportation needs - medical: Not on file  . Transportation needs - non-medical: Not on file  Occupational History  . Not on file  Tobacco Use  . Smoking status: Former Smoker    Packs/day: 1.00    Years: 16.00    Pack years: 16.00    Types: Cigarettes    Last attempt to quit: 08/12/2000    Years since quitting: 16.4  . Smokeless tobacco: Never Used  . Tobacco comment: quit 10 yrs  Substance and Sexual Activity  .  Alcohol use: Yes    Comment: occasionally  . Drug use: No  . Sexual activity: Yes    Birth control/protection: Surgical    Comment: tubal  Other Topics Concern  . Not on file  Social History Narrative  . Not on file    FAMILY HISTORY: Family History  Problem Relation Age of Onset  . Hypertension Mother   . Hyperlipidemia Mother   . Diverticulitis Mother   . Hernia Mother   . Heart disease Father        arrythmia  . Hypertension Father   . Cancer Maternal Uncle        pancreatic  . Congestive Heart Failure Paternal Grandmother   . Heart attack Maternal Grandmother   . Aneurysm Maternal Grandfather   . Arthritis Brother     ALLERGIES:  is allergic to augmentin [amoxicillin-pot clavulanate].  MEDICATIONS:  Current Outpatient Medications  Medication Sig Dispense Refill  . ibuprofen (ADVIL,MOTRIN) 200 MG tablet Take 200 mg by mouth every 6 (six) hours as needed.    . tamoxifen (NOLVADEX) 20 MG tablet Take 1 tablet (20 mg total) by mouth daily. 90 tablet 1   No current facility-administered medications for this visit.      Review of Systems  Constitutional: Negative.   HENT: Negative.   Eyes: Negative.   Respiratory: Negative.  Negative for shortness of breath.   Cardiovascular: Negative.  Negative for chest pain.  Gastrointestinal: Negative.  Negative for abdominal pain.  Genitourinary: Negative.   Musculoskeletal: Negative.        Constant pain and soreness in the upper outer quadrant of the right breast near axilla, ongoing 1 day  Skin: Negative.   Neurological: Negative.  Negative for headaches.  Endo/Heme/Allergies: Negative.   Psychiatric/Behavioral: Negative.   All other systems reviewed and are negative.  14 point ROS was done and is otherwise as detailed above or in HPI   PHYSICAL EXAMINATION: ECOG PERFORMANCE STATUS: 0 - Asymptomatic  Vitals:   01/07/17 1433  BP: 120/79  Pulse: 77  Resp: 18  Temp: 98.3 F (36.8 C)  SpO2: 100%   Filed  Weights   01/07/17 1433  Weight: 219 lb 8 oz (99.6 kg)    Physical Exam  Constitutional: She is oriented to person, place, and time and well-developed, well-nourished, and in no distress.  HENT:  Head: Normocephalic and atraumatic.  Eyes: Conjunctivae and EOM are normal. Pupils are equal, round, and reactive to light.  Neck: Normal range of motion. Neck supple.  Cardiovascular: Normal rate, regular rhythm and normal heart sounds.  Pulmonary/Chest: Effort normal and breath sounds normal. Right breast exhibits no inverted nipple, no mass, no nipple discharge, no skin change and no tenderness. Left breast exhibits no inverted nipple, no mass, no nipple discharge, no skin change and no tenderness. Breasts are symmetrical.    Abdominal: Soft. Bowel sounds are normal.  Musculoskeletal: Normal  range of motion.  Neurological: She is alert and oriented to person, place, and time. Gait normal.  Skin: Skin is warm and dry.  Nursing note and vitals reviewed.     LABORATORY DATA:  I have reviewed the data as listed Lab Results  Component Value Date   WBC 7.0 09/28/2012   HGB 13.9 09/28/2012   HCT 41.0 09/28/2012   MCV 89.1 09/28/2012   PLT 240 09/28/2012   CMP     Component Value Date/Time   NA 137 09/28/2012 1325   NA 140 04/01/2012 1327   K 3.8 09/28/2012 1325   K 3.5 04/01/2012 1327   CL 103 09/28/2012 1325   CL 106 04/01/2012 1327   CO2 24 09/28/2012 1325   CO2 23 04/01/2012 1327   GLUCOSE 98 09/28/2012 1325   GLUCOSE 98 04/01/2012 1327   BUN 13 09/28/2012 1325   BUN 13.8 04/01/2012 1327   CREATININE 1.02 09/28/2012 1325   CREATININE 1.0 04/01/2012 1327   CALCIUM 9.1 09/28/2012 1325   CALCIUM 9.2 04/01/2012 1327   PROT 7.4 09/28/2012 1325   PROT 7.1 04/01/2012 1327   ALBUMIN 3.6 09/28/2012 1325   ALBUMIN 3.6 04/01/2012 1327   AST 20 09/28/2012 1325   AST 18 04/01/2012 1327   ALT 14 09/28/2012 1325   ALT 20 04/01/2012 1327   ALKPHOS 72 09/28/2012 1325   ALKPHOS 71  04/01/2012 1327   BILITOT 0.3 09/28/2012 1325   BILITOT 0.50 04/01/2012 1327   GFRNONAA 67 (L) 09/28/2012 1325   GFRAA 77 (L) 09/28/2012 1325     RADIOGRAPHIC STUDIES: I have personally reviewed the radiological images as listed and agreed with the findings in the report. No results found.  2D DIGITAL DIAGNOSTIC BILATERAL MAMMOGRAM WITH CAD AND ADJUNCT TOMO 01/09/2016  IMPRESSION: No evidence of malignancy in either breast.  ASSESSMENT & PLAN:  46 year old female with hx of LCIS of right breast s/p lumpectomy, she did not take adjuvant endocrine therapy with Tamoxifen due to concerns about potential side effects.  Had recurrence of her LCIS in the right breast and on 09/30/2016 she underwent a right breast lumpectomy with radioactive seed localization and margin assessment by Dr. Dalbert Batman.     PLAN: -Clinically NED on breast exam today. -Discussed endocrine therapy with tamoxifen in detail with her today. I discussed with her that she start on tamoxifen especially given her recurrence, patient agrees.  -Reviewed side effects of tamoxifen in detail with her, including hot flashes, risk for uterine cancer, risk of cataracts, risk of thromboembolic event. -Advised her that she will need annual GYN follow up and also eye exams while on tamoxifen.  -Discussed with her compliance to tamoxifen, especially since she was non-compliant before, and she verbalized understanding.  -Repeat bilateral mammogram/MRI in July 2019. -RTC in 3 months for follow up and to assess tolerability to tamoxifen. -Labs today and on next visit. -I have told her that I can make a referral to GI at this time for her dysphagia, however patient states that she would like to hold off at this time, and will take start prilosec to see if these are actually GERD symptoms.  Orders Placed This Encounter  Procedures  . CBC with Differential    Standing Status:   Standing    Number of Occurrences:   2    Standing Expiration  Date:   01/07/2018  . Comprehensive metabolic panel    Standing Status:   Standing    Number of Occurrences:  2    Standing Expiration Date:   01/07/2018      All questions were answered. The patient knows to call the clinic with any problems, questions or concerns.  This note was electronically signed by:  Twana First, MD  01/07/2017 3:14 PM

## 2017-05-22 ENCOUNTER — Other Ambulatory Visit (HOSPITAL_COMMUNITY): Payer: Self-pay | Admitting: *Deleted

## 2017-05-22 DIAGNOSIS — C50919 Malignant neoplasm of unspecified site of unspecified female breast: Secondary | ICD-10-CM

## 2017-05-23 ENCOUNTER — Encounter (HOSPITAL_COMMUNITY): Payer: Self-pay | Admitting: Internal Medicine

## 2017-05-23 ENCOUNTER — Inpatient Hospital Stay (HOSPITAL_COMMUNITY): Payer: 59 | Attending: Hematology | Admitting: Internal Medicine

## 2017-05-23 ENCOUNTER — Other Ambulatory Visit: Payer: Self-pay

## 2017-05-23 ENCOUNTER — Inpatient Hospital Stay (HOSPITAL_COMMUNITY): Payer: 59

## 2017-05-23 VITALS — BP 112/72 | HR 64 | Temp 98.2°F | Resp 16 | Wt 220.0 lb

## 2017-05-23 DIAGNOSIS — K3184 Gastroparesis: Secondary | ICD-10-CM | POA: Diagnosis not present

## 2017-05-23 DIAGNOSIS — D0501 Lobular carcinoma in situ of right breast: Secondary | ICD-10-CM | POA: Diagnosis not present

## 2017-05-23 DIAGNOSIS — M2550 Pain in unspecified joint: Secondary | ICD-10-CM

## 2017-05-23 DIAGNOSIS — C50919 Malignant neoplasm of unspecified site of unspecified female breast: Secondary | ICD-10-CM

## 2017-05-23 DIAGNOSIS — M25559 Pain in unspecified hip: Secondary | ICD-10-CM

## 2017-05-23 LAB — CBC WITH DIFFERENTIAL/PLATELET
Basophils Absolute: 0 10*3/uL (ref 0.0–0.1)
Basophils Relative: 1 %
Eosinophils Absolute: 0.2 10*3/uL (ref 0.0–0.7)
Eosinophils Relative: 3 %
HEMATOCRIT: 41.1 % (ref 36.0–46.0)
HEMOGLOBIN: 13.5 g/dL (ref 12.0–15.0)
LYMPHS ABS: 1.8 10*3/uL (ref 0.7–4.0)
Lymphocytes Relative: 30 %
MCH: 29.7 pg (ref 26.0–34.0)
MCHC: 32.8 g/dL (ref 30.0–36.0)
MCV: 90.3 fL (ref 78.0–100.0)
MONOS PCT: 8 %
Monocytes Absolute: 0.5 10*3/uL (ref 0.1–1.0)
NEUTROS ABS: 3.5 10*3/uL (ref 1.7–7.7)
NEUTROS PCT: 58 %
Platelets: 224 10*3/uL (ref 150–400)
RBC: 4.55 MIL/uL (ref 3.87–5.11)
RDW: 12.2 % (ref 11.5–15.5)
WBC: 6.1 10*3/uL (ref 4.0–10.5)

## 2017-05-23 LAB — COMPREHENSIVE METABOLIC PANEL
ALBUMIN: 3.7 g/dL (ref 3.5–5.0)
ALT: 24 U/L (ref 14–54)
ANION GAP: 9 (ref 5–15)
AST: 22 U/L (ref 15–41)
Alkaline Phosphatase: 46 U/L (ref 38–126)
BUN: 12 mg/dL (ref 6–20)
CHLORIDE: 106 mmol/L (ref 101–111)
CO2: 23 mmol/L (ref 22–32)
CREATININE: 0.93 mg/dL (ref 0.44–1.00)
Calcium: 9.1 mg/dL (ref 8.9–10.3)
GFR calc Af Amer: >60 mL/min (ref >60)
GFR calc non Af Amer: >60 mL/min (ref >60)
Glucose, Bld: 105 mg/dL — ABNORMAL HIGH (ref 65–99)
Potassium: 4.2 mmol/L (ref 3.5–5.1)
SODIUM: 138 mmol/L (ref 135–145)
Total Bilirubin: 0.5 mg/dL (ref 0.3–1.2)
Total Protein: 7.1 g/dL (ref 6.5–8.1)

## 2017-05-23 NOTE — Patient Instructions (Signed)
[place] at [facility]  Discharge Instructions:  You were seen by Dr. Walden Field today.  Try an OTC medication for the itching.   We will do labs today.  _______________________________________________________________  Thank you for choosing [facility] at [facility] to provide your oncology and hematology care.  To afford each patient quality time with our providers, please arrive at least 15 minutes before your scheduled appointment.  You need to re-schedule your appointment if you arrive 10 or more minutes late.  We strive to give you quality time with our providers, and arriving late affects you and other patients whose appointments are after yours.  Also, if you no show three or more times for appointments you may be dismissed from the clinic.  Again, thank you for choosing [facility] hope is that these requests will allow you access to exceptional care and in a timely manner. _______________________________________________________________  If you have questions after your visit, please contact our office at [phone] between the hours of 8:30 a.m. and 5:00 p.m. Voicemails left after 4:30 p.m. will not be returned until the following business day. _______________________________________________________________  For prescription refill requests, have your pharmacy contact our office. _______________________________________________________________  Recommendations made by the consultant and any test results will be sent to your referring physician. _______________________________________________________________

## 2017-05-26 ENCOUNTER — Encounter: Payer: Self-pay | Admitting: Internal Medicine

## 2017-05-26 ENCOUNTER — Other Ambulatory Visit (HOSPITAL_COMMUNITY): Payer: 59

## 2017-06-24 NOTE — Progress Notes (Signed)
Diagnosis Lobular carcinoma in situ of right breast - Plan: Rheumatoid factor, Sedimentation rate, Protein electrophoresis, serum, C-reactive protein, MM DIAG BREAST TOMO BILATERAL, ANA, IFA (with reflex), CANCELED: ANA, IFA (with reflex)  Pain in joint involving pelvic region and thigh, unspecified laterality - Plan: Rheumatoid factor, Sedimentation rate, Protein electrophoresis, serum, C-reactive protein, MM DIAG BREAST TOMO BILATERAL, ANA, IFA (with reflex), CANCELED: ANA, IFA (with reflex)  Staging Cancer Staging No matching staging information was found for the patient.  Assessment and Plan: 1.  LCIS of right breast s/p lumpectomy, she did not take adjuvant endocrine therapy with Tamoxifen due to concerns about potential side effects.  Had recurrence of her LCIS in the right breast and on 09/30/2016 she underwent a right breast lumpectomy with radioactive seed localization and margin assessment by Dr. Dalbert Batman.    She was previously followed by Dr. Talbert Cage who discussed endocrine therapy with tamoxifen in detail especially given her recurrence history.  She remains on Tamoxifen which she began in 12/2016.  She had left screening mammogram done 09/05/2016 that was negative with bilateral diagnostic mammogram recommended in 08/2017.    She will RTC to go over mammogram results.  Labs reviewed from 05/23/2017 and were WNL.    2.  Rash/joint pain.  Will check, ANA, sed rate, CRP, RF, SPEP.    3.  Gastrophresis.  Follow-up with PCP or GI as directed.   - Interval History:  47 y.o. Female referred by Dr. Anastasio Champion due to history of breast cancer.  She has history of R breast LCIS initially diagnosed in 2012. She has not seen an oncologist since November 2016 when she was seen by Dr. Talbert Cage.    As per Dr. Sydnee Cabal note on 01/09/2015: " Female with history of LCIS of the right breast, upper outer quadrant. This was found and treated in 2012. At the time of her right lumpectomy no residual LCIS was found  only fibrocystic disease. She was placed on tamoxifen which she did not do well with. After long discussion last year we opted not to consider continuing that drug. She was much delighted with that decision.   Breast neoplasm, Tis (LCIS), right  11/19/2010 Biopsy  Core biopsies right lateral revealed LCIS ER +99%, PR +83%  12/20/2010 Surgery  Right lumpectomy reveals no residual LCIS, only fibrocystic changes  01/14/2011 - Hormone Therapy  Tamoxifen 10 mg twice a day initiated  07/20/2012 Adverse Reaction  Patient stops tamoxifen because of sensations of not feeling well, worsening hot flashes, increased appetite and weight, etc."  She had recurrent LCIS in the right upper outer quadrant of her breast.  On 09/30/2016 she underwent a right breast lumpectomy with radioactive seed localization and margin assessment by Dr. Dalbert Batman.    Current status:  Pt is seen today for follow-up.  She reports rash.  Last mammogram in 08/2016.    MEDICAL HISTORY:  Past Medical History:  Diagnosis Date  . Breast cancer ([place]) 2012   rt breast  . Breast disorder    cancer right breast  . Cancer ([place])    right lobular carcinoma in situ  . Gastroparesis    controlled    Problem List Patient Active Problem List   Diagnosis Date Noted  . History of right breast cancer [Z85.3] 09/02/2016  . Encounter for gynecological examination with Papanicolaou smear of cervix [Z01.419] 09/02/2016  . Pelvic pain [R10.2] 09/02/2016  . Recurrent breast cancer, right ([place]) [C50.911] 09/02/2016  . SUI (stress urinary incontinence, female) [N39.3] 09/02/2016  .  Lobular carcinoma in situ of right breast [D05.01] 12/03/2010    Past Medical History Past Medical History:  Diagnosis Date  . Breast cancer ([place]) 2012   rt breast  . Breast disorder    cancer right breast  . Cancer ([place])    right lobular carcinoma in situ  . Gastroparesis    controlled    Past Surgical History Past Surgical History:  Procedure  Laterality Date  . BACK SURGERY  10/2002   lumbar hemilaminectomy, microdiscectomy L5-S1  . BREAST BIOPSY Right 07/2016   malignant; waiting on MRI results  . BREAST LUMPECTOMY WITH RADIOACTIVE SEED LOCALIZATION Right 10/08/2016   Procedure: RIGHT BREAST LUMPECTOMY WITH RADIOACTIVE SEED LOCALIZATION;  Surgeon: Fanny Skates, MD;  Location: [facility];  Service: General;  Laterality: Right;  . BREAST SURGERY    . MASTECTOMY, PARTIAL Right 12/19/2010   Procedure: MASTECTOMY PARTIAL;  Surgeon: Adin Hector, MD;  Location: [facility];  Service: General;  Laterality: Right;  right partial mastectomy and needle localization  . TUBAL LIGATION  2001    Family History Family History  Problem Relation Age of Onset  . Hypertension Mother   . Hyperlipidemia Mother   . Diverticulitis Mother   . Hernia Mother   . Heart disease Father        arrythmia  . Hypertension Father   . Cancer Maternal Uncle        pancreatic  . Congestive Heart Failure Paternal Grandmother   . Heart attack Maternal Grandmother   . Aneurysm Maternal Grandfather   . Arthritis Brother      Social History  reports that she quit smoking about 16 years ago. Her smoking use included cigarettes. She has a 16.00 pack-year smoking history. She has never used smokeless tobacco. She reports that she drinks alcohol. She reports that she does not use drugs.  Medications  Current Outpatient Medications:  .  ibuprofen (ADVIL,MOTRIN) 200 MG tablet, Take 200 mg by mouth every 6 (six) hours as needed., Disp: , Rfl:  .  tamoxifen (NOLVADEX) 20 MG tablet, Take 1 tablet (20 mg total) by mouth daily., Disp: 90 tablet, Rfl: 1  Allergies Augmentin [amoxicillin-pot clavulanate]  Review of Systems Review of Systems - Oncology ROS as per HPI otherwise 12 point ROS is negative.   Physical Exam  Vitals Wt Readings from Last 3 Encounters:  05/23/17 220 lb (99.8 kg)  01/07/17 219 lb 8 oz (99.6 kg)   10/08/16 215 lb (97.5 kg)   Temp Readings from Last 3 Encounters:  05/23/17 98.2 F (36.8 C) (Oral)  01/07/17 98.3 F (36.8 C) (Oral)  10/08/16 98 F (36.7 C)   BP Readings from Last 3 Encounters:  05/23/17 112/72  01/07/17 120/79  10/08/16 133/71   Pulse Readings from Last 3 Encounters:  05/23/17 64  01/07/17 77  10/08/16 81    Constitutional: Well-developed, well-nourished, and in no distress.   HENT: Head: Normocephalic and atraumatic.  Mouth/Throat: No oropharyngeal exudate. Mucosa moist. Eyes: Pupils are equal, round, and reactive to light. Conjunctivae are normal. No scleral icterus.  Neck: Normal range of motion. Neck supple. No JVD present.  Cardiovascular: Normal rate, regular rhythm and normal heart sounds.  Exam reveals no gallop and no friction rub.   No murmur heard. Pulmonary/Chest: Effort normal and breath sounds normal. No respiratory distress. No wheezes.No rales.  Abdominal: Soft. Bowel sounds are normal. No distension. There is no tenderness. There is no guarding.  Musculoskeletal: No edema or  tenderness.  Lymphadenopathy: No cervical, axillary or supraclavicular adenopathy.  Neurological: Alert and oriented to person, place, and time. No cranial nerve deficit.  Skin: Skin is warm and dry. No rash noted. No erythema. No pallor.  Psychiatric: Affect and judgment normal.  Bilateral breast exam:  Chaperone present.  Right lumpectomy.  No palpable abnormalities noted bilaterally.    Labs Appointment on 05/23/2017  Component Date Value Ref Range Status  . Sodium 05/23/2017 138  135 - 145 mmol/L Final  . Potassium 05/23/2017 4.2  3.5 - 5.1 mmol/L Final  . Chloride 05/23/2017 106  101 - 111 mmol/L Final  . CO2 05/23/2017 23  22 - 32 mmol/L Final  . Glucose, Bld 05/23/2017 105* 65 - 99 mg/dL Final  . BUN 05/23/2017 12  6 - 20 mg/dL Final  . Creatinine, Ser 05/23/2017 0.93  0.44 - 1.00 mg/dL Final  . Calcium 05/23/2017 9.1  8.9 - 10.3 mg/dL Final  . Total  Protein 05/23/2017 7.1  6.5 - 8.1 g/dL Final  . Albumin 05/23/2017 3.7  3.5 - 5.0 g/dL Final  . AST 05/23/2017 22  15 - 41 U/L Final  . ALT 05/23/2017 24  14 - 54 U/L Final  . Alkaline Phosphatase 05/23/2017 46  38 - 126 U/L Final  . Total Bilirubin 05/23/2017 0.5  0.3 - 1.2 mg/dL Final  . GFR calc non Af Amer 05/23/2017 >60  >60 mL/min Final  . GFR calc Af Amer 05/23/2017 >60  >60 mL/min Final   Comment: (NOTE) The eGFR has been calculated using the CKD EPI equation. This calculation has not been validated in all clinical situations. eGFR's persistently <60 mL/min signify possible Chronic Kidney Disease.   Georgiann Hahn gap 05/23/2017 9  5 - 15 Final   Performed at [facility], [address] [place], [place] [address]  . WBC 05/23/2017 6.1  4.0 - 10.5 K/uL Final  . RBC 05/23/2017 4.55  3.87 - 5.11 MIL/uL Final  . Hemoglobin 05/23/2017 13.5  12.0 - 15.0 g/dL Final  . HCT 05/23/2017 41.1  36.0 - 46.0 % Final  . MCV 05/23/2017 90.3  78.0 - 100.0 fL Final  . MCH 05/23/2017 29.7  26.0 - 34.0 pg Final  . MCHC 05/23/2017 32.8  30.0 - 36.0 g/dL Final  . RDW 05/23/2017 12.2  11.5 - 15.5 % Final  . Platelets 05/23/2017 224  150 - 400 K/uL Final  . Neutrophils Relative % 05/23/2017 58  % Final  . Neutro Abs 05/23/2017 3.5  1.7 - 7.7 K/uL Final  . Lymphocytes Relative 05/23/2017 30  % Final  . Lymphs Abs 05/23/2017 1.8  0.7 - 4.0 K/uL Final  . Monocytes Relative 05/23/2017 8  % Final  . Monocytes Absolute 05/23/2017 0.5  0.1 - 1.0 K/uL Final  . Eosinophils Relative 05/23/2017 3  % Final  . Eosinophils Absolute 05/23/2017 0.2  0.0 - 0.7 K/uL Final  . Basophils Relative 05/23/2017 1  % Final  . Basophils Absolute 05/23/2017 0.0  0.0 - 0.1 K/uL Final   Performed at [facility], [address], [place],  [address]     Pathology Orders Placed This Encounter  Procedures  . MM DIAG BREAST TOMO BILATERAL    Standing Status:   Future    Standing Expiration Date:   05/24/2018    Order  Specific Question:   Reason for Exam (SYMPTOM  OR DIAGNOSIS REQUIRED)    Answer:   LCIS    Order Specific Question:  Is the patient pregnant?    Answer:   No    Order Specific Question:   Preferred imaging location?    Answer:   [facility]  . Rheumatoid factor    Standing Status:   Future    Standing Expiration Date:   05/23/2018  . Sedimentation rate    Standing Status:   Future    Standing Expiration Date:   05/23/2018  . Protein electrophoresis, serum    Standing Status:   Future    Standing Expiration Date:   05/23/2018  . C-reactive protein    Standing Status:   Future    Standing Expiration Date:   05/23/2018  . ANA, IFA (with reflex)    Standing Status:   Future    Standing Expiration Date:   05/24/2018       Zoila Shutter Ap-Acapa Nurse MD

## 2017-07-28 IMAGING — MG 2D DIGITAL SCREENING UNILATERAL LEFT MAMMOGRAM WITH CAD AND ADJU
6 series · 6 of 14 positions shown · non-contrast
Comparison: Previous exam(s).

CLINICAL DATA: Screening. Patient had a diagnostic right mammogram
performed July 09, 2016. Therefore, a unilateral left breast
screening mammogram is performed today. The patient had a bilateral
breast MRI on 09/01/2016.

EXAM:
2D DIGITAL SCREENING UNILATERAL LEFT MAMMOGRAM WITH CAD AND ADJUNCT
TOMO

[L MLO synth-2D]
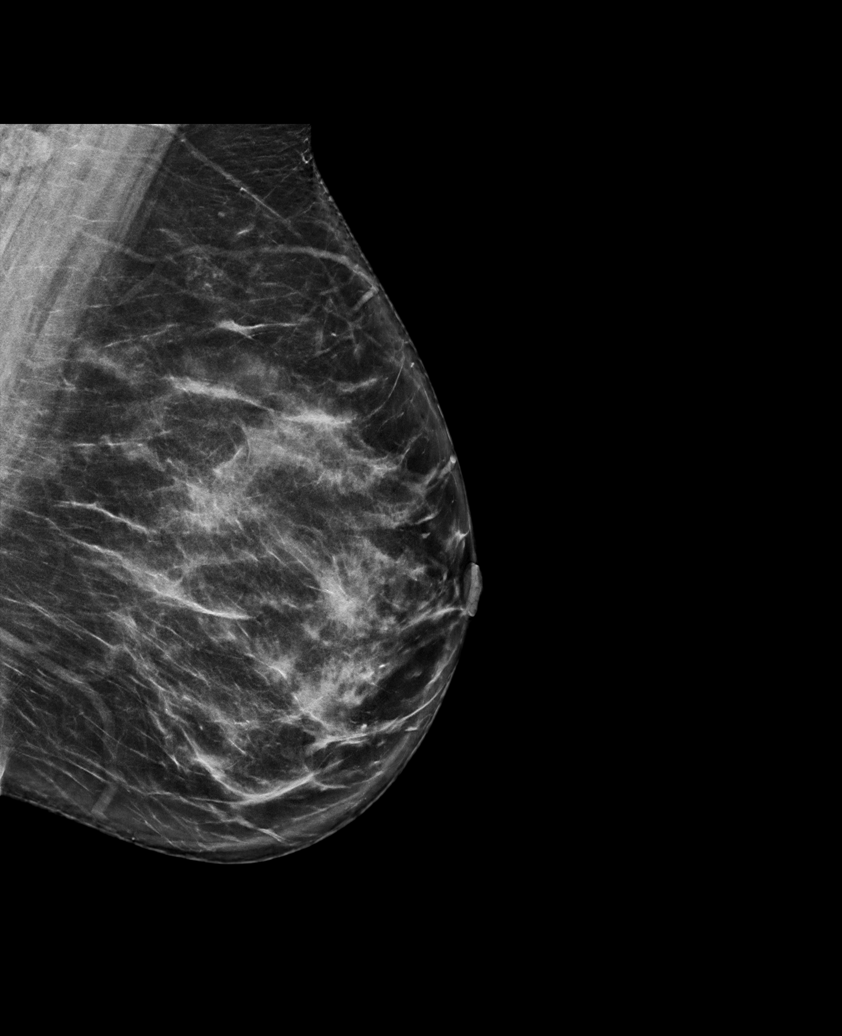

[L CC synth-2D]
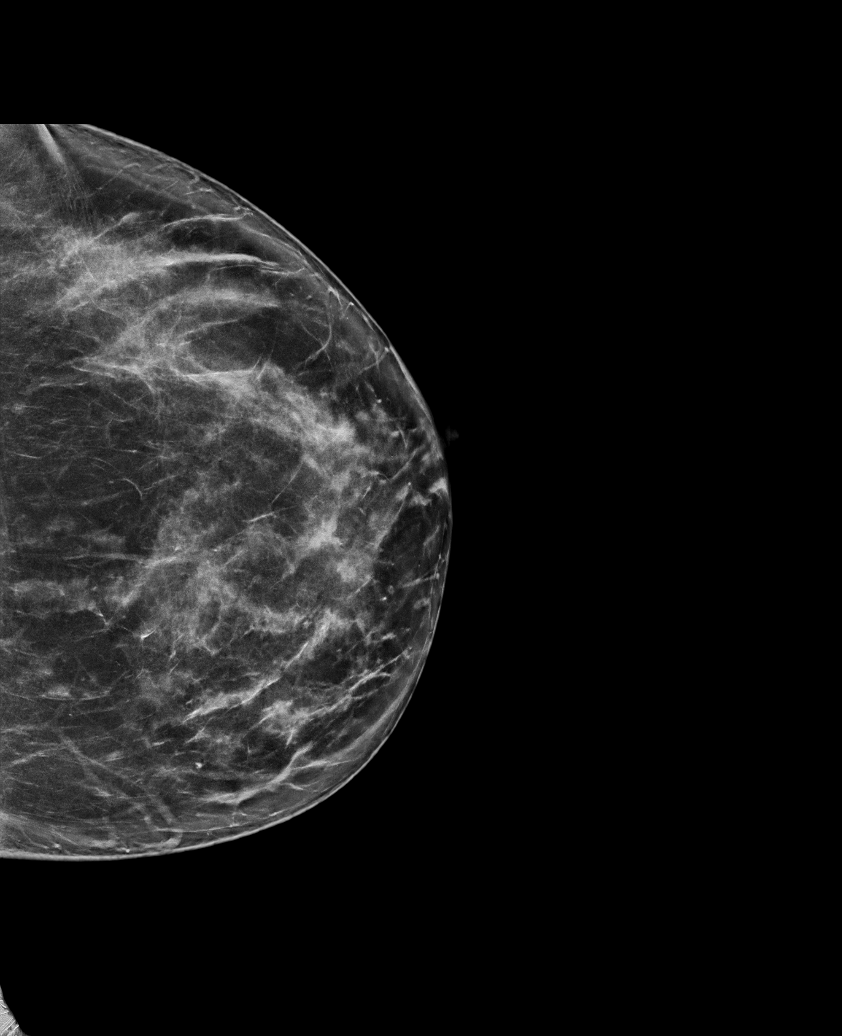

[L CC]
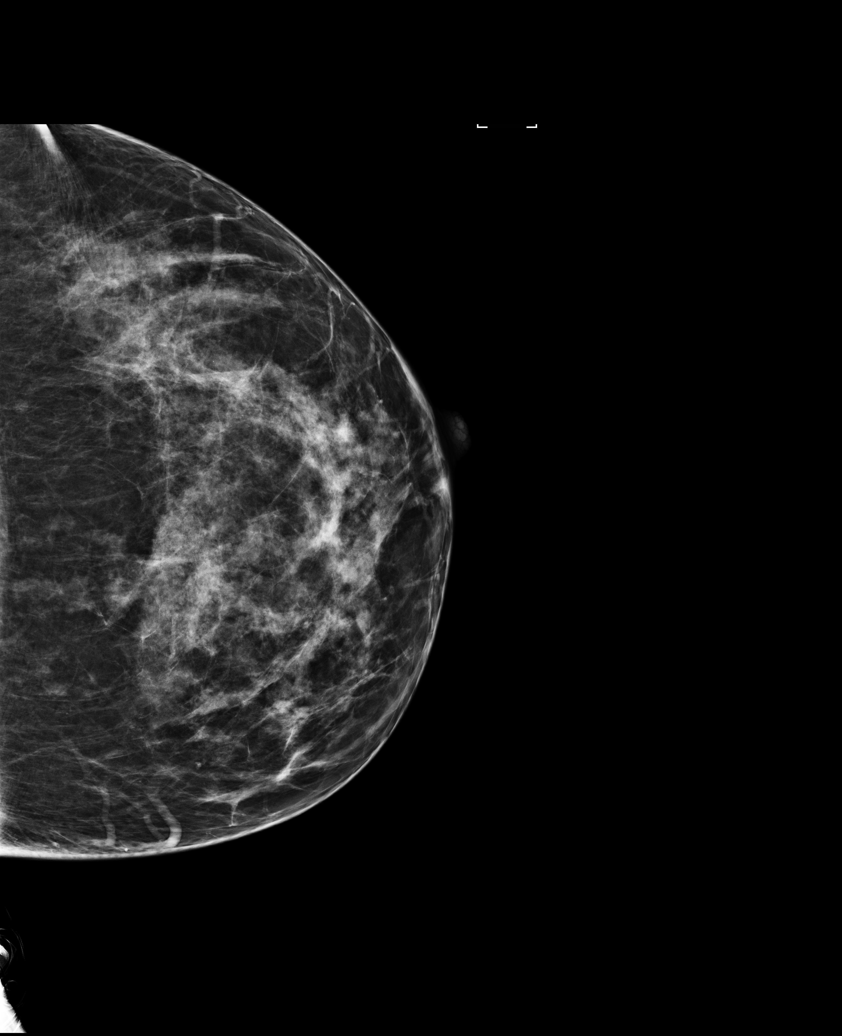

[L MLO]
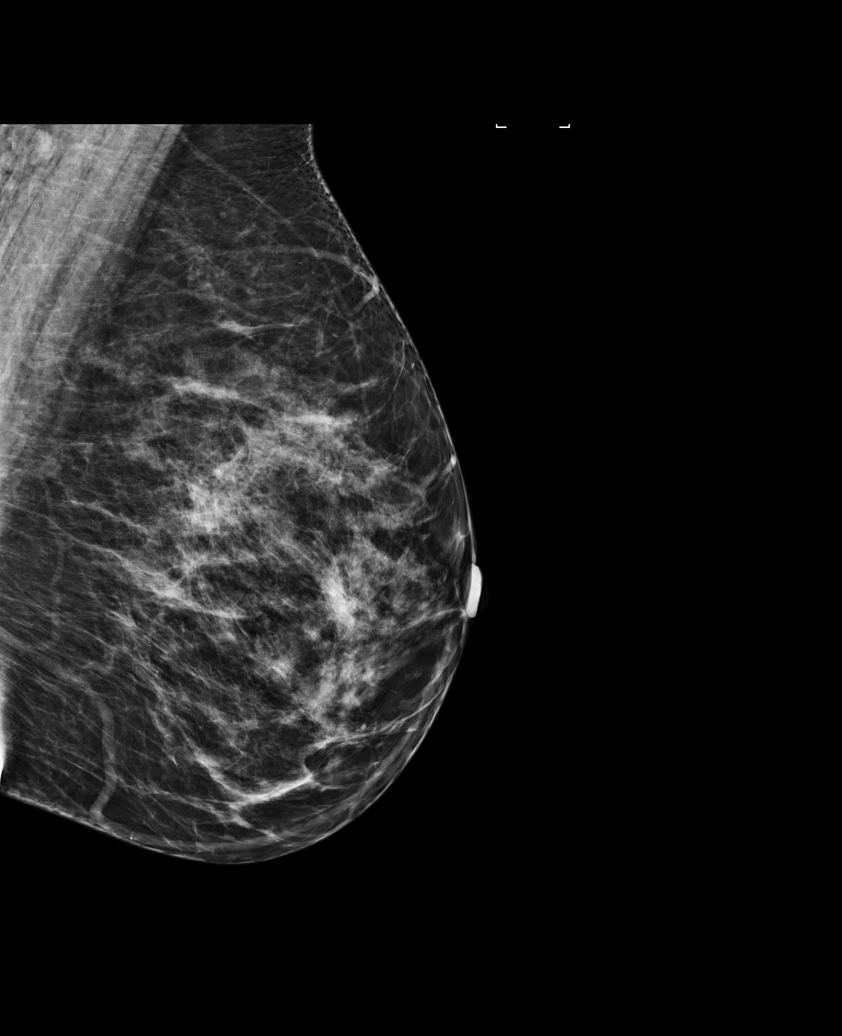

[L CC tomo · tomo slice 41/81.0]
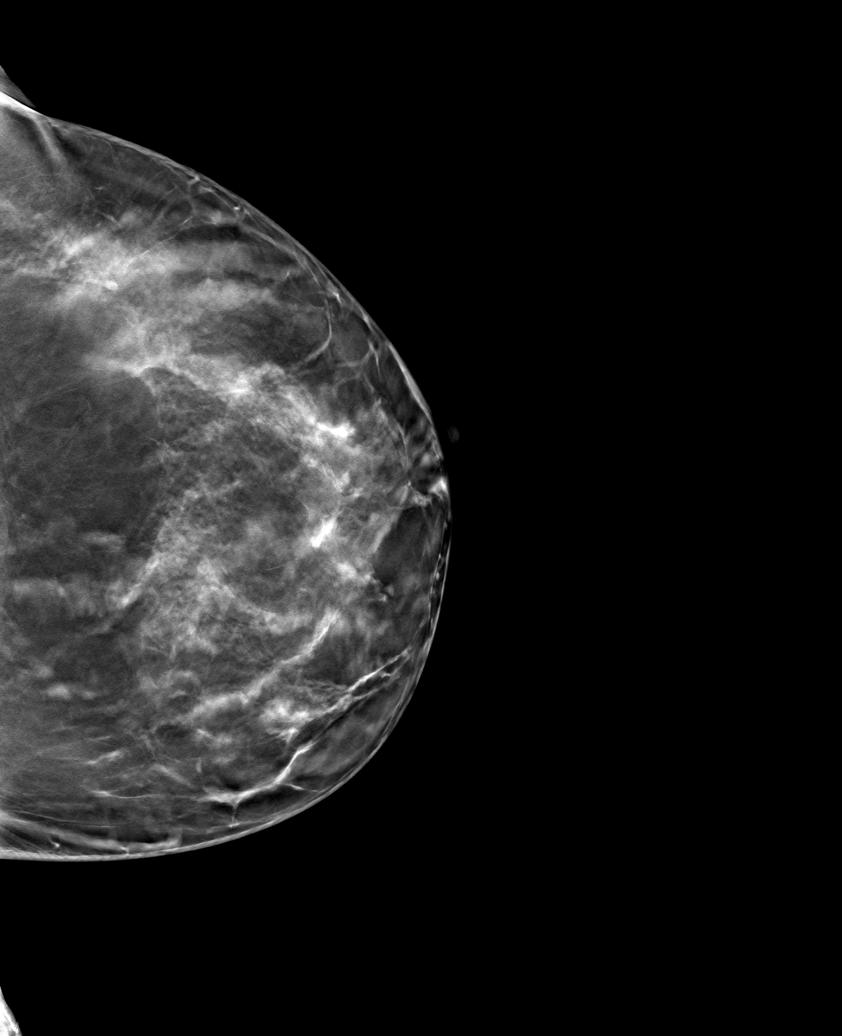

[L MLO tomo · tomo slice 43/86.0]
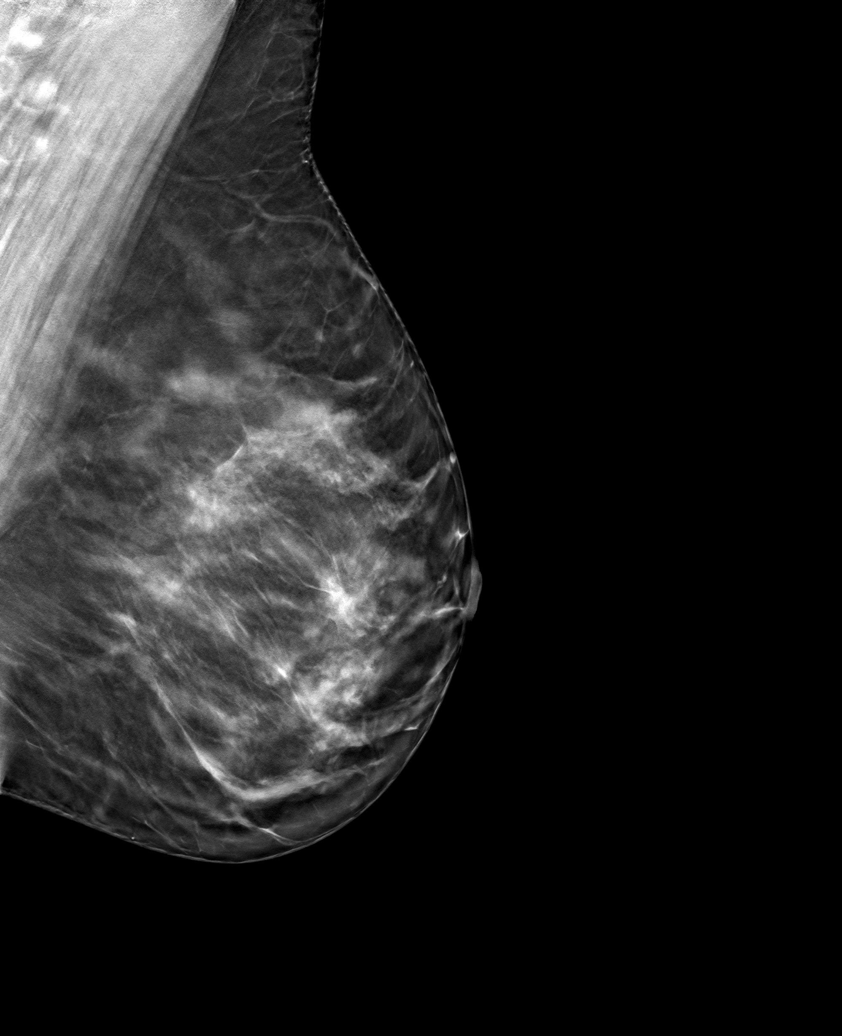

[6 of 14 positions shown; findings below may reference images not displayed]

ACR Breast Density Category c: The breast tissue is heterogeneously
dense, which may obscure small masses.
FINDINGS: There are no findings suspicious for malignancy. Images were
processed with CAD.
IMPRESSION: No mammographic evidence of malignancy. A result letter of this
screening mammogram will be mailed directly to the patient.

RECOMMENDATION:
Screening mammogram in one year. (Code:KS-4-Y2E)

BI-RADS CATEGORY  1: Negative.

## 2017-08-12 ENCOUNTER — Encounter (INDEPENDENT_AMBULATORY_CARE_PROVIDER_SITE_OTHER): Payer: Self-pay

## 2017-08-12 ENCOUNTER — Ambulatory Visit (INDEPENDENT_AMBULATORY_CARE_PROVIDER_SITE_OTHER): Payer: BLUE CROSS/BLUE SHIELD | Admitting: Adult Health

## 2017-08-12 ENCOUNTER — Encounter: Payer: Self-pay | Admitting: Adult Health

## 2017-08-12 VITALS — BP 116/80 | HR 75 | Ht 66.5 in | Wt 214.0 lb

## 2017-08-12 DIAGNOSIS — F4323 Adjustment disorder with mixed anxiety and depressed mood: Secondary | ICD-10-CM | POA: Diagnosis not present

## 2017-08-12 MED ORDER — ALPRAZOLAM 0.25 MG PO TABS: 0.2500 mg | ORAL_TABLET | Freq: Two times a day (BID) | ORAL | 0 refills | Status: DC | PRN

## 2017-08-12 MED ORDER — BUSPIRONE HCL 7.5 MG PO TABS: 7.5000 mg | ORAL_TABLET | Freq: Two times a day (BID) | ORAL | 6 refills | Status: DC

## 2017-08-12 NOTE — Progress Notes (Signed)
  Subjective:     Patient ID: [name], female   DOB: 26-Jun-1970, 47 y.o.   MRN: [id number]  HPI Ahsha is a 47 year old white female, married worked in for anxiety, she says she is stressed, can't sleep well and gets short of breath and anxious at times.Her daughter just turned 61 and is going to college in the fall (that is her baby) and she is thinking of changing jobs.   Review of Systems +anxious Feels Short of breath at times Not sleeping well + stressed Reviewed past medical,surgical, social and family history. Reviewed medications and allergies.     Objective:   Physical Exam BP 116/80 (BP Location: Left Arm, Patient Position: Sitting, Cuff Size: Normal)   Pulse 75   Ht 5' 6.5" (1.689 m)   Wt 214 lb (97.1 kg)   BMI 34.02 kg/m  Skin warm and dry. Lungs: clear to ausculation bilaterally. Cardiovascular: regular rate and rhythm. PHQ 9 score 15,denies being suicidal, more anxious than depressed, and is open to meds, will start on Buspar, and will give some xanax for feeling like panic attack is coming on.     Assessment:     1. Adjustment reaction with anxiety and depression       Plan:     Meds ordered this encounter  Medications  . ALPRAZolam (XANAX) 0.25 MG tablet    Sig: Take 1 tablet (0.25 mg total) by mouth 2 (two) times daily as needed for anxiety.    Dispense:  30 tablet    Refill:  0    Order Specific Question:   Supervising Provider    Answer:   Elonda Husky, LUTHER H [[id number]]  . busPIRone (BUSPAR) 7.5 MG tablet    Sig: Take 1 tablet (7.5 mg total) by mouth 2 (two) times daily.    Dispense:  60 tablet    Refill:  6    Order Specific Question:   Supervising Provider    Answer:   Tania Ade H [[id number]]  F/U in 6 weeks

## 2017-08-26 ENCOUNTER — Ambulatory Visit: Payer: 59 | Admitting: Internal Medicine

## 2017-09-23 ENCOUNTER — Ambulatory Visit: Payer: BLUE CROSS/BLUE SHIELD | Admitting: Adult Health

## 2017-10-16 ENCOUNTER — Other Ambulatory Visit (HOSPITAL_COMMUNITY): Payer: Self-pay | Admitting: Internal Medicine

## 2017-10-16 DIAGNOSIS — Z853 Personal history of malignant neoplasm of breast: Secondary | ICD-10-CM

## 2017-10-28 ENCOUNTER — Ambulatory Visit (HOSPITAL_COMMUNITY): Payer: Self-pay

## 2017-10-28 ENCOUNTER — Ambulatory Visit (HOSPITAL_COMMUNITY): Admission: RE | Admit: 2017-10-28 | Payer: Self-pay | Source: Ambulatory Visit

## 2017-10-28 ENCOUNTER — Encounter (HOSPITAL_COMMUNITY): Payer: Self-pay

## 2017-10-28 ENCOUNTER — Encounter (HOSPITAL_COMMUNITY): Payer: 59

## 2017-11-03 ENCOUNTER — Ambulatory Visit (HOSPITAL_COMMUNITY): Payer: 59 | Admitting: Internal Medicine

## 2017-11-04 ENCOUNTER — Ambulatory Visit (HOSPITAL_COMMUNITY): Payer: PRIVATE HEALTH INSURANCE

## 2017-11-04 ENCOUNTER — Ambulatory Visit (HOSPITAL_COMMUNITY)
Admission: RE | Admit: 2017-11-04 | Discharge: 2017-11-04 | Disposition: A | Discharge status: Home / Self Care | Payer: PRIVATE HEALTH INSURANCE | Source: Ambulatory Visit | Attending: Internal Medicine | Admitting: Internal Medicine

## 2017-11-04 ENCOUNTER — Encounter (HOSPITAL_COMMUNITY): Payer: Self-pay

## 2017-11-04 DIAGNOSIS — D0501 Lobular carcinoma in situ of right breast: Secondary | ICD-10-CM | POA: Insufficient documentation

## 2017-11-04 DIAGNOSIS — M25559 Pain in unspecified hip: Secondary | ICD-10-CM | POA: Insufficient documentation

## 2017-11-11 ENCOUNTER — Inpatient Hospital Stay (HOSPITAL_COMMUNITY): Payer: PRIVATE HEALTH INSURANCE | Attending: Hematology | Admitting: Hematology

## 2017-11-11 ENCOUNTER — Other Ambulatory Visit: Payer: Self-pay

## 2017-11-11 ENCOUNTER — Encounter (HOSPITAL_COMMUNITY): Payer: Self-pay | Admitting: Hematology

## 2017-11-11 VITALS — BP 107/71 | HR 68 | Temp 98.4°F | Resp 16 | Wt 207.3 lb

## 2017-11-11 DIAGNOSIS — N951 Menopausal and female climacteric states: Secondary | ICD-10-CM | POA: Diagnosis not present

## 2017-11-11 DIAGNOSIS — R252 Cramp and spasm: Secondary | ICD-10-CM | POA: Insufficient documentation

## 2017-11-11 DIAGNOSIS — C50911 Malignant neoplasm of unspecified site of right female breast: Secondary | ICD-10-CM

## 2017-11-11 DIAGNOSIS — Z170 Estrogen receptor positive status [ER+]: Secondary | ICD-10-CM | POA: Insufficient documentation

## 2017-11-11 DIAGNOSIS — D0501 Lobular carcinoma in situ of right breast: Secondary | ICD-10-CM | POA: Diagnosis not present

## 2017-11-11 DIAGNOSIS — Z87891 Personal history of nicotine dependence: Secondary | ICD-10-CM | POA: Diagnosis not present

## 2017-11-11 NOTE — Patient Instructions (Signed)
[facility] at [facility] Discharge Instructions     Thank you for choosing [facility] at [facility] to provide your oncology and hematology care.  To afford each patient quality time with our provider, please arrive at least 15 minutes before your scheduled appointment time.   If you have a lab appointment with the Cancer Center please come in thru the  Main Entrance and check in at the main information desk  You need to re-schedule your appointment should you arrive 10 or more minutes late.  We strive to give you quality time with our providers, and arriving late affects you and other patients whose appointments are after yours.  Also, if you no show three or more times for appointments you may be dismissed from the clinic at the providers discretion.     Again, thank you for choosing [facility].  Our hope is that these requests will decrease the amount of time that you wait before being seen by our physicians.       _____________________________________________________________  Should you have questions after your visit to [facility], please contact our office at [phone] between the hours of 8:00 a.m. and 4:30 p.m.  Voicemails left after 4:00 p.m. will not be returned until the following business day.  For prescription refill requests, have your pharmacy contact our office and allow 72 hours.    Cancer Center Support Programs:   > Cancer Support Group  2nd Tuesday of the month 1pm-2pm, Journey Room    

## 2017-11-11 NOTE — Assessment & Plan Note (Addendum)
1.  Recurrent right breast LCIS: - Initially diagnosed with right breast LCIS in 2012, status post lumpectomy, ER/PR positive, did not take tamoxifen. - Had recurrent right breast LCIS, underwent lumpectomy on 10/08/2016, tamoxifen started. - Tolerating tamoxifen very well except some hot flashes at nighttime.  She is continuing to have menses on regular basis. -Today's physical examination did not reveal any abnormalities in both breasts.  I have reviewed the results of the mammogram dated 11/04/2017 which was BI-RADS Category 2. - She complains of feeling a lump in the throat at times with a frequency of approximately once a month.  She does have a history of acid reflux and takes medication only as needed.  Neck and oropharyngeal exam today was within normal limits. -She will be coming back to see [place] in 6 months.  I will repeat a CMP and a TSH level at that time.

## 2017-11-11 NOTE — Progress Notes (Signed)
[place] [place], [place] [address]   CLINIC:  Medical Oncology/Hematology  PCP:  Rory Percy, MD [place] [address] [phone]   REASON FOR VISIT: Follow-up for Lobular carcinoma in situ of right breast  CURRENT THERAPY: Tamoxifen   INTERVAL HISTORY:  [name] 47 y.o. female returns for routine follow-up for Lobular carcinoma in situ of right breast. Patient is tolerating the tamoxifen well. She does experience hot flashes at night. She has muscle cramps at night a few times a week. She is fatigued during the day and has dizziness. Her ankles swell during the day due to her job and she is on her feet all day. Her appetite is 100% and she maintains her weight. Her energy level was 50%. She denies any new pain or lumps. Denies any nausea, vomiting, or diarrhea. Denies any SOB or cough.     REVIEW OF SYSTEMS:  Review of Systems  Constitutional: Positive for fatigue.       Sweats  HENT:   Positive for mouth sores.   Neurological: Positive for dizziness.  All other systems reviewed and are negative.    PAST MEDICAL/SURGICAL HISTORY:  Past Medical History:  Diagnosis Date  . Breast cancer ([place]) [id number]   rt breast  . Breast disorder    cancer right breast  . Cancer ([place])    right lobular carcinoma in situ  . Gastroparesis    controlled   Past Surgical History:  Procedure Laterality Date  . BACK SURGERY  10/2002   lumbar hemilaminectomy, microdiscectomy L5-S1  . BREAST BIOPSY Right 07/2016   malignant; waiting on MRI results  . BREAST LUMPECTOMY Right 2012,2018  . BREAST LUMPECTOMY WITH RADIOACTIVE SEED LOCALIZATION Right 10/08/2016   Procedure: RIGHT BREAST LUMPECTOMY WITH RADIOACTIVE SEED LOCALIZATION;  Surgeon: Fanny Skates, MD;  Location: [facility];  Service: General;  Laterality: Right;  . BREAST SURGERY    . TUBAL LIGATION  2001     SOCIAL HISTORY:  Social History   Socioeconomic History    . Marital status: Married    Spouse name: Not on file  . Number of children: Not on file  . Years of education: Not on file  . Highest education level: Not on file  Occupational History  . Not on file  Social Needs  . Financial resource strain: Not on file  . Food insecurity:    Worry: Not on file    Inability: Not on file  . Transportation needs:    Medical: Not on file    Non-medical: Not on file  Tobacco Use  . Smoking status: Former Smoker    Packs/day: 1.00    Years: 16.00    Pack years: 16.00    Types: Cigarettes    Last attempt to quit: 08/12/2000    Years since quitting: 17.2  . Smokeless tobacco: Never Used  . Tobacco comment: quit 10 yrs  Substance and Sexual Activity  . Alcohol use: Yes    Comment: occasionally  . Drug use: No  . Sexual activity: Yes    Birth control/protection: Surgical    Comment: tubal  Lifestyle  . Physical activity:    Days per week: Not on file    Minutes per session: Not on file  . Stress: Not on file  Relationships  . Social connections:    Talks on phone: Not on file    Gets together: Not on file    Attends religious  service: Not on file    Active member of club or organization: Not on file    Attends meetings of clubs or organizations: Not on file    Relationship status: Not on file  . Intimate partner violence:    Fear of current or ex partner: Not on file    Emotionally abused: Not on file    Physically abused: Not on file    Forced sexual activity: Not on file  Other Topics Concern  . Not on file  Social History Narrative  . Not on file    FAMILY HISTORY:  Family History  Problem Relation Age of Onset  . Hypertension Mother   . Hyperlipidemia Mother   . Diverticulitis Mother   . Hernia Mother   . Heart disease Father        arrythmia  . Hypertension Father   . Cancer Maternal Uncle        pancreatic  . Congestive Heart Failure Paternal Grandmother   . Heart attack Maternal Grandmother   . Aneurysm Maternal  Grandfather   . Arthritis Brother     CURRENT MEDICATIONS:  Outpatient Encounter Medications as of 11/11/2017  Medication Sig  . ALPRAZolam (XANAX) 0.25 MG tablet Take 1 tablet (0.25 mg total) by mouth 2 (two) times daily as needed for anxiety.  . busPIRone (BUSPAR) 7.5 MG tablet Take 1 tablet (7.5 mg total) by mouth 2 (two) times daily.  Marland Kitchen ibuprofen (ADVIL,MOTRIN) 200 MG tablet Take 200 mg by mouth every 6 (six) hours as needed.  . tamoxifen (NOLVADEX) 20 MG tablet Take 1 tablet (20 mg total) by mouth daily.   No facility-administered encounter medications on file as of 11/11/2017.     ALLERGIES:  Allergies  Allergen Reactions  . Augmentin [Amoxicillin-Pot Clavulanate] Hives     PHYSICAL EXAM:  ECOG Performance status: 1  Vitals:   11/11/17 1458  BP: 107/71  Pulse: 68  Resp: 16  Temp: 98.4 F (36.9 C)  SpO2: 100%   Filed Weights   11/11/17 1458  Weight: 207 lb 4.8 oz (94 kg)    Physical Exam  Constitutional: She is oriented to person, place, and time. She appears well-developed and well-nourished.  Abdominal: Soft.  Musculoskeletal: Normal range of motion.  Neurological: She is alert and oriented to person, place, and time.  Skin: Skin is warm and dry.  Psychiatric: She has a normal mood and affect. Her behavior is normal. Judgment and thought content normal.  Breast exam: Right breast lumpectomy scar in the upper outer quadrant is stable.  No palpable mass in bilateral breast.  No palpable axillary adenopathy.   LABORATORY DATA:  I have reviewed the labs as listed.  CBC    Component Value Date/Time   WBC 6.1 05/23/2017 1402   RBC 4.55 05/23/2017 1402   HGB 13.5 05/23/2017 1402   HGB 13.6 04/01/2012 1327   HCT 41.1 05/23/2017 1402   HCT 39.8 04/01/2012 1327   PLT 224 05/23/2017 1402   PLT 178 04/01/2012 1327   MCV 90.3 05/23/2017 1402   MCV 89.8 04/01/2012 1327   MCH 29.7 05/23/2017 1402   MCHC 32.8 05/23/2017 1402   RDW 12.2 05/23/2017 1402   RDW  12.6 04/01/2012 1327   LYMPHSABS 1.8 05/23/2017 1402   LYMPHSABS 1.8 04/01/2012 1327   MONOABS 0.5 05/23/2017 1402   MONOABS 0.5 04/01/2012 1327   EOSABS 0.2 05/23/2017 1402   EOSABS 0.1 04/01/2012 1327   BASOSABS 0.0 05/23/2017 1402   BASOSABS 0.0  04/01/2012 1327   CMP Latest Ref Rng & Units 05/23/2017 01/07/2017 09/28/2012  Glucose 65 - 99 mg/dL 105(H) 100(H) 98  BUN 6 - 20 mg/dL 12 11 13   Creatinine 0.44 - 1.00 mg/dL 0.93 0.95 1.02  Sodium 135 - 145 mmol/L 138 141 137  Potassium 3.5 - 5.1 mmol/L 4.2 3.8 3.8  Chloride 101 - 111 mmol/L 106 107 103  CO2 22 - 32 mmol/L 23 27 24   Calcium 8.9 - 10.3 mg/dL 9.1 9.5 9.1  Total Protein 6.5 - 8.1 g/dL 7.1 7.6 7.4  Total Bilirubin 0.3 - 1.2 mg/dL 0.5 0.5 0.3  Alkaline Phos 38 - 126 U/L 46 56 72  AST 15 - 41 U/L 22 17 20   ALT 14 - 54 U/L 24 16 14        DIAGNOSTIC IMAGING:  I have discussed the results of the mammogram dated 11/04/2017 and reviewed with the patient.     ASSESSMENT & PLAN:   Recurrent breast cancer, right ([place]) 1.  Recurrent right breast LCIS: - Initially diagnosed with right breast LCIS in 2012, status post lumpectomy, ER/PR positive, did not take tamoxifen. - Had recurrent right breast LCIS, underwent lumpectomy on 10/08/2016, tamoxifen started. - Tolerating tamoxifen very well except some hot flashes at nighttime.  She is continuing to have menses on regular basis. -Today's physical examination did not reveal any abnormalities in both breasts.  I have reviewed the results of the mammogram dated 11/04/2017 which was BI-RADS Category 2. - She complains of feeling a lump in the throat at times with a frequency of approximately once a month.  She does have a history of acid reflux and takes medication only as needed.  Neck and oropharyngeal exam today was within normal limits. -She will be coming back to see [place] in 6 months.  I will repeat a CMP and a TSH level at that time.      Orders placed this encounter:  Orders  Placed This Encounter  Procedures  . Comprehensive metabolic panel  . TSH      Derek Jack, [place] [phone]

## 2017-12-12 DEATH — deceased

## 2018-01-02 ENCOUNTER — Other Ambulatory Visit (HOSPITAL_COMMUNITY): Payer: Self-pay | Admitting: *Deleted

## 2018-01-02 MED ORDER — TAMOXIFEN CITRATE 20 MG PO TABS: 20.0000 mg | ORAL_TABLET | Freq: Every day | ORAL | 2 refills | Status: DC

## 2018-05-18 ENCOUNTER — Inpatient Hospital Stay (HOSPITAL_COMMUNITY): Payer: No Typology Code available for payment source | Attending: Hematology

## 2018-05-18 ENCOUNTER — Other Ambulatory Visit: Payer: Self-pay

## 2018-05-18 DIAGNOSIS — Z87891 Personal history of nicotine dependence: Secondary | ICD-10-CM | POA: Insufficient documentation

## 2018-05-18 DIAGNOSIS — D0501 Lobular carcinoma in situ of right breast: Secondary | ICD-10-CM | POA: Diagnosis present

## 2018-05-18 DIAGNOSIS — N951 Menopausal and female climacteric states: Secondary | ICD-10-CM | POA: Insufficient documentation

## 2018-05-18 LAB — TSH: TSH: 0.957 u[IU]/mL (ref 0.350–4.500)

## 2018-05-18 LAB — COMPREHENSIVE METABOLIC PANEL
ALT: 22 U/L (ref 0–44)
AST: 21 U/L (ref 15–41)
Albumin: 3.9 g/dL (ref 3.5–5.0)
Alkaline Phosphatase: 54 U/L (ref 38–126)
Anion gap: 8 (ref 5–15)
BUN: 20 mg/dL (ref 6–20)
CO2: 25 mmol/L (ref 22–32)
Calcium: 9.2 mg/dL (ref 8.9–10.3)
Chloride: 106 mmol/L (ref 98–111)
Creatinine, Ser: 0.91 mg/dL (ref 0.44–1.00)
GFR calc Af Amer: >60 mL/min (ref >60)
GFR calc non Af Amer: >60 mL/min (ref >60)
Glucose, Bld: 132 mg/dL — ABNORMAL HIGH (ref 70–99)
Potassium: 4 mmol/L (ref 3.5–5.1)
Sodium: 139 mmol/L (ref 135–145)
Total Bilirubin: 0.4 mg/dL (ref 0.3–1.2)
Total Protein: 7.4 g/dL (ref 6.5–8.1)

## 2018-05-25 ENCOUNTER — Encounter (HOSPITAL_COMMUNITY): Payer: Self-pay | Admitting: Hematology

## 2018-05-25 ENCOUNTER — Other Ambulatory Visit: Payer: Self-pay

## 2018-05-25 ENCOUNTER — Inpatient Hospital Stay (HOSPITAL_BASED_OUTPATIENT_CLINIC_OR_DEPARTMENT_OTHER): Payer: No Typology Code available for payment source | Admitting: Hematology

## 2018-05-25 VITALS — BP 116/77 | HR 90 | Temp 98.1°F | Resp 16 | Wt 211.9 lb

## 2018-05-25 DIAGNOSIS — Z87891 Personal history of nicotine dependence: Secondary | ICD-10-CM | POA: Diagnosis not present

## 2018-05-25 DIAGNOSIS — N951 Menopausal and female climacteric states: Secondary | ICD-10-CM | POA: Diagnosis not present

## 2018-05-25 DIAGNOSIS — D0501 Lobular carcinoma in situ of right breast: Secondary | ICD-10-CM | POA: Diagnosis not present

## 2018-05-25 DIAGNOSIS — C50911 Malignant neoplasm of unspecified site of right female breast: Secondary | ICD-10-CM

## 2018-05-25 NOTE — Patient Instructions (Addendum)
[place] at [facility] Discharge Instructions  You were seen today by Dr. Delton Coombes. He went over your recent lab results. He will see you back in 6 months for labs, mammogram and follow up.   Thank you for choosing [place] at [facility] to provide your oncology and hematology care.  To afford each patient quality time with our provider, please arrive at least 15 minutes before your scheduled appointment time.   If you have a lab appointment with the [facility] please come in thru the  Main Entrance and check in at the main information desk  You need to re-schedule your appointment should you arrive 10 or more minutes late.  We strive to give you quality time with our providers, and arriving late affects you and other patients whose appointments are after yours.  Also, if you no show three or more times for appointments you may be dismissed from the clinic at the providers discretion.     Again, thank you for choosing [facility].  Our hope is that these requests will decrease the amount of time that you wait before being seen by our physicians.       _____________________________________________________________  Should you have questions after your visit to [facility], please contact our office at [phone] between the hours of 8:00 a.m. and 4:30 p.m.  Voicemails left after 4:00 p.m. will not be returned until the following business day.  For prescription refill requests, have your pharmacy contact our office and allow 72 hours.    Cancer Center Support Programs:   > Cancer Support Group  2nd Tuesday of the month 1pm-2pm, Journey Room

## 2018-05-25 NOTE — Assessment & Plan Note (Signed)
1.  Recurrent right breast LCIS: -Initially diagnosed with right breast LCIS in 2012, status post lumpectomy, ER/PR positive, did not take tamoxifen. -Recurrent right breast LCIS, status post lumpectomy on 10/08/2016, tamoxifen initiated. - She is tolerating tamoxifen reasonably well except some hot flashes at nighttime. -She is continuing to have menstruation on a regular basis.  LMP was end of March.  -Mammogram on 11/04/2017 was BI-RADS Category 2. -Today's examination did not reveal any palpable abnormalities in both breasts. -We reviewed her blood work which is within normal limits. -She will follow-up with [place] in 6 months with repeat physical exam and blood work.  I will plan to repeat mammogram in September. - She reports that her surgeon has recommended doing periodic MRIs.  We will decide based on her mammogram.  She reportedly had a negative genetic test after her first diagnosis.

## 2018-05-25 NOTE — Progress Notes (Signed)
[address] [place], [place] [address]   CLINIC:  Medical Oncology/Hematology  PCP:  Rory Percy, MD [place] [address] [phone]   REASON FOR VISIT:  Follow-up for Lobular carcinoma in situ of right breast  CURRENT THERAPY: Tamoxifen     INTERVAL HISTORY:  [name] 48 y.o. female returns for routine follow-up. She is here today alone. She states that she is taking her tamoxifen as directed. She states that she has the normal side effects like hot flashes. LMP 05/10/18 Denies any nausea, vomiting, or diarrhea. Denies any new pains. Had not noticed any recent bleeding such as epistaxis, hematuria or hematochezia. Denies recent chest pain on exertion, shortness of breath on minimal exertion, pre-syncopal episodes, or palpitations. Denies any numbness or tingling in hands or feet. Denies any recent fevers, infections, or recent hospitalizations. Patient reports appetite at 100% and energy level at 75%.  REVIEW OF SYSTEMS:  Review of Systems  Constitutional: Positive for fatigue.  Neurological: Positive for numbness.     PAST MEDICAL/SURGICAL HISTORY:  Past Medical History:  Diagnosis Date  . Breast cancer ([place]) [id number]   rt breast  . Breast disorder    cancer right breast  . Cancer ([place])    right lobular carcinoma in situ  . Gastroparesis    controlled   Past Surgical History:  Procedure Laterality Date  . BACK SURGERY  10/2002   lumbar hemilaminectomy, microdiscectomy L5-S1  . BREAST BIOPSY Right 07/2016   malignant; waiting on MRI results  . BREAST LUMPECTOMY Right 2012,2018  . BREAST LUMPECTOMY WITH RADIOACTIVE SEED LOCALIZATION Right 10/08/2016   Procedure: RIGHT BREAST LUMPECTOMY WITH RADIOACTIVE SEED LOCALIZATION;  Surgeon: Fanny Skates, MD;  Location: [facility];  Service: General;  Laterality: Right;  . BREAST SURGERY    . TUBAL LIGATION  2001     SOCIAL HISTORY:  Social History    Socioeconomic History  . Marital status: Married    Spouse name: Not on file  . Number of children: Not on file  . Years of education: Not on file  . Highest education level: Not on file  Occupational History  . Not on file  Social Needs  . Financial resource strain: Not on file  . Food insecurity:    Worry: Not on file    Inability: Not on file  . Transportation needs:    Medical: Not on file    Non-medical: Not on file  Tobacco Use  . Smoking status: Former Smoker    Packs/day: 1.00    Years: 16.00    Pack years: 16.00    Types: Cigarettes    Last attempt to quit: 08/12/2000    Years since quitting: 17.7  . Smokeless tobacco: Never Used  . Tobacco comment: quit 10 yrs  Substance and Sexual Activity  . Alcohol use: Yes    Comment: occasionally  . Drug use: No  . Sexual activity: Yes    Birth control/protection: Surgical    Comment: tubal  Lifestyle  . Physical activity:    Days per week: Not on file    Minutes per session: Not on file  . Stress: Not on file  Relationships  . Social connections:    Talks on phone: Not on file    Gets together: Not on file    Attends religious service: Not on file    Active member of club or organization: Not on file    Attends meetings  of clubs or organizations: Not on file    Relationship status: Not on file  . Intimate partner violence:    Fear of current or ex partner: Not on file    Emotionally abused: Not on file    Physically abused: Not on file    Forced sexual activity: Not on file  Other Topics Concern  . Not on file  Social History Narrative  . Not on file    FAMILY HISTORY:  Family History  Problem Relation Age of Onset  . Hypertension Mother   . Hyperlipidemia Mother   . Diverticulitis Mother   . Hernia Mother   . Heart disease Father        arrythmia  . Hypertension Father   . Cancer Maternal Uncle        pancreatic  . Congestive Heart Failure Paternal Grandmother   . Heart attack Maternal Grandmother    . Aneurysm Maternal Grandfather   . Arthritis Brother     CURRENT MEDICATIONS:  Outpatient Encounter Medications as of 05/25/2018  Medication Sig  . busPIRone (BUSPAR) 7.5 MG tablet Take 1 tablet (7.5 mg total) by mouth 2 (two) times daily. (Patient taking differently: Take 7.5 mg by mouth daily as needed. )  . ibuprofen (ADVIL,MOTRIN) 200 MG tablet Take 200 mg by mouth every 6 (six) hours as needed.  . tamoxifen (NOLVADEX) 20 MG tablet Take 1 tablet (20 mg total) by mouth daily.  . [DISCONTINUED] ALPRAZolam (XANAX) 0.25 MG tablet Take 1 tablet (0.25 mg total) by mouth 2 (two) times daily as needed for anxiety. (Patient not taking: Reported on 05/25/2018)   No facility-administered encounter medications on file as of 05/25/2018.     ALLERGIES:  Allergies  Allergen Reactions  . Augmentin [Amoxicillin-Pot Clavulanate] Hives     PHYSICAL EXAM:  ECOG Performance status: 0  Vitals:   05/25/18 0819  BP: 116/77  Pulse: 90  Resp: 16  Temp: 98.1 F (36.7 C)  SpO2: 99%   Filed Weights   05/25/18 0819  Weight: 211 lb 14.4 oz (96.1 kg)    Physical Exam Vitals signs reviewed.  Constitutional:      Appearance: Normal appearance.  Cardiovascular:     Rate and Rhythm: Normal rate and regular rhythm.     Heart sounds: Normal heart sounds.  Pulmonary:     Breath sounds: Normal breath sounds.  Abdominal:     General: There is no distension.     Palpations: Abdomen is soft. There is no mass.     Tenderness: There is no abdominal tenderness.  Musculoskeletal:        General: No swelling.  Skin:    General: Skin is warm.  Neurological:     General: No focal deficit present.     Mental Status: She is alert and oriented to person, place, and time.  Psychiatric:        Mood and Affect: Mood normal.        Behavior: Behavior normal.   Bilateral breast exam did not reveal any palpable masses.  Right breast lumpectomy site is within normal limits.  No palpable adenopathy.    LABORATORY DATA:  I have reviewed the labs as listed.  CBC    Component Value Date/Time   WBC 6.1 05/23/2017 1402   RBC 4.55 05/23/2017 1402   HGB 13.5 05/23/2017 1402   HGB 13.6 04/01/2012 1327   HCT 41.1 05/23/2017 1402   HCT 39.8 04/01/2012 1327   PLT 224 05/23/2017 1402  PLT 178 04/01/2012 1327   MCV 90.3 05/23/2017 1402   MCV 89.8 04/01/2012 1327   MCH 29.7 05/23/2017 1402   MCHC 32.8 05/23/2017 1402   RDW 12.2 05/23/2017 1402   RDW 12.6 04/01/2012 1327   LYMPHSABS 1.8 05/23/2017 1402   LYMPHSABS 1.8 04/01/2012 1327   MONOABS 0.5 05/23/2017 1402   MONOABS 0.5 04/01/2012 1327   EOSABS 0.2 05/23/2017 1402   EOSABS 0.1 04/01/2012 1327   BASOSABS 0.0 05/23/2017 1402   BASOSABS 0.0 04/01/2012 1327   CMP Latest Ref Rng & Units 05/18/2018 05/23/2017 01/07/2017  Glucose 70 - 99 mg/dL 132(H) 105(H) 100(H)  BUN 6 - 20 mg/dL 20 12 11   Creatinine 0.44 - 1.00 mg/dL 0.91 0.93 0.95  Sodium 135 - 145 mmol/L 139 138 141  Potassium 3.5 - 5.1 mmol/L 4.0 4.2 3.8  Chloride 98 - 111 mmol/L 106 106 107  CO2 22 - 32 mmol/L 25 23 27   Calcium 8.9 - 10.3 mg/dL 9.2 9.1 9.5  Total Protein 6.5 - 8.1 g/dL 7.4 7.1 7.6  Total Bilirubin 0.3 - 1.2 mg/dL 0.4 0.5 0.5  Alkaline Phos 38 - 126 U/L 54 46 56  AST 15 - 41 U/L 21 22 17   ALT 0 - 44 U/L 22 24 16        DIAGNOSTIC IMAGING:  I have independently reviewed the scans and discussed with the patient.   I have reviewed Venita Lick LPN's note and agree with the documentation.  I personally performed a face-to-face visit, made revisions and my assessment and plan is as follows.    ASSESSMENT & PLAN:   Recurrent breast cancer, right ([place]) 1.  Recurrent right breast LCIS: -Initially diagnosed with right breast LCIS in 2012, status post lumpectomy, ER/PR positive, did not take tamoxifen. -Recurrent right breast LCIS, status post lumpectomy on 10/08/2016, tamoxifen initiated. - She is tolerating tamoxifen reasonably well except some hot flashes  at nighttime. -She is continuing to have menstruation on a regular basis.  LMP was end of March.  -Mammogram on 11/04/2017 was BI-RADS Category 2. -Today's examination did not reveal any palpable abnormalities in both breasts. -We reviewed her blood work which is within normal limits. -She will follow-up with [place] in 6 months with repeat physical exam and blood work.  I will plan to repeat mammogram in September. - She reports that her surgeon has recommended doing periodic MRIs.  We will decide based on her mammogram.  She reportedly had a negative genetic test after her first diagnosis.      Orders placed this encounter:  Orders Placed This Encounter  Procedures  . MM DIAG BREAST TOMO BILATERAL      Derek Jack, MD [place] [phone]

## 2018-06-15 MED FILL — TAMOXIFEN CITRATE 20 MG TAB: 20 | 90 days supply | Qty: 90 | Fill #0

## 2018-10-12 ENCOUNTER — Other Ambulatory Visit (HOSPITAL_COMMUNITY): Payer: Self-pay | Admitting: Hematology

## 2018-10-12 DIAGNOSIS — D0501 Lobular carcinoma in situ of right breast: Secondary | ICD-10-CM

## 2018-11-10 ENCOUNTER — Other Ambulatory Visit: Payer: Self-pay

## 2018-11-10 ENCOUNTER — Ambulatory Visit (HOSPITAL_COMMUNITY)
Admission: RE | Admit: 2018-11-10 | Discharge: 2018-11-10 | Disposition: A | Discharge status: Home / Self Care | Payer: No Typology Code available for payment source | Source: Ambulatory Visit | Attending: Hematology | Admitting: Hematology

## 2018-11-10 ENCOUNTER — Encounter (HOSPITAL_COMMUNITY): Payer: No Typology Code available for payment source

## 2018-11-10 ENCOUNTER — Ambulatory Visit (HOSPITAL_COMMUNITY): Payer: No Typology Code available for payment source

## 2018-11-10 ENCOUNTER — Ambulatory Visit (HOSPITAL_COMMUNITY): Admission: RE | Admit: 2018-11-10 | Payer: No Typology Code available for payment source | Source: Ambulatory Visit

## 2018-11-10 DIAGNOSIS — D0501 Lobular carcinoma in situ of right breast: Secondary | ICD-10-CM | POA: Diagnosis not present

## 2018-11-12 ENCOUNTER — Other Ambulatory Visit (HOSPITAL_COMMUNITY): Payer: Self-pay | Admitting: Nurse Practitioner

## 2018-11-12 DIAGNOSIS — C50911 Malignant neoplasm of unspecified site of right female breast: Secondary | ICD-10-CM

## 2018-11-13 ENCOUNTER — Other Ambulatory Visit: Payer: Self-pay

## 2018-11-13 ENCOUNTER — Inpatient Hospital Stay (HOSPITAL_COMMUNITY): Payer: No Typology Code available for payment source | Attending: Hematology

## 2018-11-13 DIAGNOSIS — D0501 Lobular carcinoma in situ of right breast: Secondary | ICD-10-CM | POA: Insufficient documentation

## 2018-11-13 DIAGNOSIS — N951 Menopausal and female climacteric states: Secondary | ICD-10-CM | POA: Insufficient documentation

## 2018-11-13 DIAGNOSIS — C50911 Malignant neoplasm of unspecified site of right female breast: Secondary | ICD-10-CM

## 2018-11-13 LAB — COMPREHENSIVE METABOLIC PANEL
ALT: 29 U/L (ref 0–44)
AST: 27 U/L (ref 15–41)
Albumin: 3.8 g/dL (ref 3.5–5.0)
Alkaline Phosphatase: 45 U/L (ref 38–126)
Anion gap: 9 (ref 5–15)
BUN: 16 mg/dL (ref 6–20)
CO2: 23 mmol/L (ref 22–32)
Calcium: 9.1 mg/dL (ref 8.9–10.3)
Chloride: 107 mmol/L (ref 98–111)
Creatinine, Ser: 1 mg/dL (ref 0.44–1.00)
GFR calc Af Amer: >60 mL/min (ref >60)
GFR calc non Af Amer: >60 mL/min (ref >60)
Glucose, Bld: 86 mg/dL (ref 70–99)
Potassium: 3.8 mmol/L (ref 3.5–5.1)
Sodium: 139 mmol/L (ref 135–145)
Total Bilirubin: 0.6 mg/dL (ref 0.3–1.2)
Total Protein: 7.2 g/dL (ref 6.5–8.1)

## 2018-11-13 LAB — CBC WITH DIFFERENTIAL/PLATELET
Abs Immature Granulocytes: 0.01 10*3/uL (ref 0.00–0.07)
Basophils Absolute: 0 10*3/uL (ref 0.0–0.1)
Basophils Relative: 1 %
Eosinophils Absolute: 0.2 10*3/uL (ref 0.0–0.5)
Eosinophils Relative: 3 %
HCT: 44.4 % (ref 36.0–46.0)
Hemoglobin: 14.4 g/dL (ref 12.0–15.0)
Immature Granulocytes: 0 %
Lymphocytes Relative: 26 %
Lymphs Abs: 1.6 10*3/uL (ref 0.7–4.0)
MCH: 30.6 pg (ref 26.0–34.0)
MCHC: 32.4 g/dL (ref 30.0–36.0)
MCV: 94.3 fL (ref 80.0–100.0)
Monocytes Absolute: 0.4 10*3/uL (ref 0.1–1.0)
Monocytes Relative: 7 %
Neutro Abs: 4.1 10*3/uL (ref 1.7–7.7)
Neutrophils Relative %: 63 %
Platelets: 218 10*3/uL (ref 150–400)
RBC: 4.71 MIL/uL (ref 3.87–5.11)
RDW: 11.9 % (ref 11.5–15.5)
WBC: 6.4 10*3/uL (ref 4.0–10.5)
nRBC: 0 % (ref 0.0–0.2)

## 2018-11-13 LAB — VITAMIN D 25 HYDROXY (VIT D DEFICIENCY, FRACTURES): Vit D, 25-Hydroxy: 45.45 ng/mL (ref 30–100)

## 2018-11-13 LAB — VITAMIN B12: Vitamin B-12: 387 pg/mL (ref 180–914)

## 2018-11-16 ENCOUNTER — Other Ambulatory Visit (HOSPITAL_COMMUNITY): Payer: No Typology Code available for payment source

## 2018-11-23 ENCOUNTER — Other Ambulatory Visit: Payer: Self-pay

## 2018-11-23 ENCOUNTER — Encounter (HOSPITAL_COMMUNITY): Payer: Self-pay | Admitting: Hematology

## 2018-11-23 ENCOUNTER — Inpatient Hospital Stay (HOSPITAL_BASED_OUTPATIENT_CLINIC_OR_DEPARTMENT_OTHER): Payer: No Typology Code available for payment source | Admitting: Hematology

## 2018-11-23 VITALS — BP 136/95 | HR 83 | Temp 97.7°F | Resp 18 | Wt 216.9 lb

## 2018-11-23 DIAGNOSIS — Z0000 Encounter for general adult medical examination without abnormal findings: Secondary | ICD-10-CM

## 2018-11-23 DIAGNOSIS — C50911 Malignant neoplasm of unspecified site of right female breast: Secondary | ICD-10-CM

## 2018-11-23 DIAGNOSIS — D0501 Lobular carcinoma in situ of right breast: Secondary | ICD-10-CM | POA: Diagnosis not present

## 2018-11-23 MED ORDER — INFLUENZA VAC SPLIT QUAD 0.5 ML IM SUSY: 0.5000 mL | PREFILLED_SYRINGE | Freq: Once | INTRAMUSCULAR | Status: DC

## 2018-11-23 NOTE — Assessment & Plan Note (Signed)
1.  Recurrent right breast LCIS: - Initially diagnosed with right breast LCIS in 2012, status post lumpectomy, ER/PR positive, did not take tamoxifen. - Recurrent right breast LCIS, status post lumpectomy on 10/08/2016. - Tamoxifen started in September 2018.  She is tolerating it very well.  Minor hot flashes are stable. - Physical examination today did not reveal any palpable masses.  Lumpectomy scars in the upper outer quadrant of the right breast are stable.  No palpable adenopathy. - We reviewed mammogram dated 11/10/2018 which was BI-RADS Category 2. -I have also reviewed her labs which are within normal limits. - She reportedly had a negative genetic test. -She will be seen back in 6 months for follow-up with repeat physical exam and labs.

## 2018-11-23 NOTE — Patient Instructions (Addendum)
[facility] at [facility] Discharge Instructions  You were seen today by Dr. Katragadda. He went over your recent lab results. He will see you back in 6 months for labs and follow up.   Thank you for choosing [facility] at [facility] to provide your oncology and hematology care.  To afford each patient quality time with our provider, please arrive at least 15 minutes before your scheduled appointment time.   If you have a lab appointment with the Cancer Center please come in thru the  Main Entrance and check in at the main information desk  You need to re-schedule your appointment should you arrive 10 or more minutes late.  We strive to give you quality time with our providers, and arriving late affects you and other patients whose appointments are after yours.  Also, if you no show three or more times for appointments you may be dismissed from the clinic at the providers discretion.     Again, thank you for choosing [facility].  Our hope is that these requests will decrease the amount of time that you wait before being seen by our physicians.       _____________________________________________________________  Should you have questions after your visit to  Cancer Center, please contact our office at [phone] between the hours of 8:00 a.m. and 4:30 p.m.  Voicemails left after 4:00 p.m. will not be returned until the following business day.  For prescription refill requests, have your pharmacy contact our office and allow 72 hours.    Cancer Center Support Programs:   > Cancer Support Group  2nd Tuesday of the month 1pm-2pm, Journey Room    

## 2018-11-23 NOTE — Progress Notes (Signed)
[place], [place] [address]   CLINIC:  Medical Oncology/Hematology  PCP:  Rory Percy, MD [place] [address] [phone]   REASON FOR VISIT:  Follow-up for Lobular carcinoma in situ of right breast  CURRENT THERAPY: Tamoxifen     INTERVAL HISTORY:  [name] 48 y.o. female seen for follow-up of LCIS of the right breast.  Reported some trouble swallowing from acid reflux.  She takes medication for it as needed.  Appetite and energy levels are 100%.  No sleep problems reported.  Denies any musculoskeletal pains.  Denies any fevers, night sweats or weight loss in the last 6 months.  Denies any nausea, vomiting, diarrhea or constipation.  Very minor hot flashes are stable.  REVIEW OF SYSTEMS:  Review of Systems  HENT:   Positive for trouble swallowing.   All other systems reviewed and are negative.    PAST MEDICAL/SURGICAL HISTORY:  Past Medical History:  Diagnosis Date  . Breast cancer ([facility]) [id number]   rt breast  . Breast disorder    cancer right breast  . Cancer ([facility])    right lobular carcinoma in situ  . Gastroparesis    controlled   Past Surgical History:  Procedure Laterality Date  . BACK SURGERY  10/2002   lumbar hemilaminectomy, microdiscectomy L5-S1  . BREAST BIOPSY Right 07/2016   malignant; waiting on MRI results  . BREAST LUMPECTOMY Right 2012,2018  . BREAST LUMPECTOMY WITH RADIOACTIVE SEED LOCALIZATION Right 10/08/2016   Procedure: RIGHT BREAST LUMPECTOMY WITH RADIOACTIVE SEED LOCALIZATION;  Surgeon: Fanny Skates, MD;  Location: [facility];  Service: General;  Laterality: Right;  . BREAST SURGERY    . TUBAL LIGATION  2001     SOCIAL HISTORY:  Social History   Socioeconomic History  . Marital status: Married    Spouse name: Not on file  . Number of children: Not on file  . Years of education: Not on file  . Highest education level: Not on file  Occupational History  . Not  on file  Social Needs  . Financial resource strain: Not on file  . Food insecurity    Worry: Not on file    Inability: Not on file  . Transportation needs    Medical: Not on file    Non-medical: Not on file  Tobacco Use  . Smoking status: Former Smoker    Packs/day: 1.00    Years: 16.00    Pack years: 16.00    Types: Cigarettes    Quit date: 08/12/2000    Years since quitting: 18.2  . Smokeless tobacco: Never Used  . Tobacco comment: quit 10 yrs  Substance and Sexual Activity  . Alcohol use: Yes    Comment: occasionally  . Drug use: No  . Sexual activity: Yes    Birth control/protection: Surgical    Comment: tubal  Lifestyle  . Physical activity    Days per week: Not on file    Minutes per session: Not on file  . Stress: Not on file  Relationships  . Social Herbalist on phone: Not on file    Gets together: Not on file    Attends religious service: Not on file    Active member of club or organization: Not on file    Attends meetings of clubs or organizations: Not on file    Relationship status: Not on file  . Intimate partner violence  Fear of current or ex partner: Not on file    Emotionally abused: Not on file    Physically abused: Not on file    Forced sexual activity: Not on file  Other Topics Concern  . Not on file  Social History Narrative  . Not on file    FAMILY HISTORY:  Family History  Problem Relation Age of Onset  . Hypertension Mother   . Hyperlipidemia Mother   . Diverticulitis Mother   . Hernia Mother   . Heart disease Father        arrythmia  . Hypertension Father   . Cancer Maternal Uncle        pancreatic  . Congestive Heart Failure Paternal Grandmother   . Heart attack Maternal Grandmother   . Aneurysm Maternal Grandfather   . Arthritis Brother     CURRENT MEDICATIONS:  Outpatient Encounter Medications as of 11/23/2018  Medication Sig  . tamoxifen (NOLVADEX) 20 MG tablet Take 1 tablet (20 mg total) by mouth daily.  .  busPIRone (BUSPAR) 7.5 MG tablet Take 1 tablet (7.5 mg total) by mouth 2 (two) times daily. (Patient not taking: Reported on 11/23/2018)  . ibuprofen (ADVIL,MOTRIN) 200 MG tablet Take 200 mg by mouth every 6 (six) hours as needed.  . [DISCONTINUED] nitrofurantoin, macrocrystal-monohydrate, (MACROBID) 100 MG capsule Take 100 mg by mouth 2 (two) times daily.   Facility-Administered Encounter Medications as of 11/23/2018  Medication  . influenza vac split quadrivalent PF (FLUARIX) injection 0.5 mL    ALLERGIES:  Allergies  Allergen Reactions  . Augmentin [Amoxicillin-Pot Clavulanate] Hives     PHYSICAL EXAM:  ECOG Performance status: 0  Vitals:   11/23/18 0818  BP: (!) 136/95  Pulse: 83  Resp: 18  Temp: 97.7 F (36.5 C)  SpO2: 100%   Filed Weights   11/23/18 0818  Weight: 216 lb 14.4 oz (98.4 kg)    Physical Exam Vitals signs reviewed.  Constitutional:      Appearance: Normal appearance.  Cardiovascular:     Rate and Rhythm: Normal rate and regular rhythm.     Heart sounds: Normal heart sounds.  Pulmonary:     Breath sounds: Normal breath sounds.  Abdominal:     General: There is no distension.     Palpations: Abdomen is soft. There is no mass.     Tenderness: There is no abdominal tenderness.  Musculoskeletal:        General: No swelling.  Skin:    General: Skin is warm.  Neurological:     General: No focal deficit present.     Mental Status: She is alert and oriented to person, place, and time.  Psychiatric:        Mood and Affect: Mood normal.        Behavior: Behavior normal.   Right lumpectomy scars in the upper outer quadrant are stable.  No palpable masses in the left breast.  No palpable adenopathy.   LABORATORY DATA:  I have reviewed the labs as listed.  CBC    Component Value Date/Time   WBC 6.4 11/13/2018 0952   RBC 4.71 11/13/2018 0952   HGB 14.4 11/13/2018 0952   HGB 13.6 04/01/2012 1327   HCT 44.4 11/13/2018 0952   HCT 39.8 04/01/2012  1327   PLT 218 11/13/2018 0952   PLT 178 04/01/2012 1327   MCV 94.3 11/13/2018 0952   MCV 89.8 04/01/2012 1327   MCH 30.6 11/13/2018 0952   MCHC 32.4 11/13/2018 0952  RDW 11.9 11/13/2018 0952   RDW 12.6 04/01/2012 1327   LYMPHSABS 1.6 11/13/2018 0952   LYMPHSABS 1.8 04/01/2012 1327   MONOABS 0.4 11/13/2018 0952   MONOABS 0.5 04/01/2012 1327   EOSABS 0.2 11/13/2018 0952   EOSABS 0.1 04/01/2012 1327   BASOSABS 0.0 11/13/2018 0952   BASOSABS 0.0 04/01/2012 1327   CMP Latest Ref Rng & Units 11/13/2018 05/18/2018 05/23/2017  Glucose 70 - 99 mg/dL 86 132(H) 105(H)  BUN 6 - 20 mg/dL 16 20 12   Creatinine 0.44 - 1.00 mg/dL 1.00 0.91 0.93  Sodium 135 - 145 mmol/L 139 139 138  Potassium 3.5 - 5.1 mmol/L 3.8 4.0 4.2  Chloride 98 - 111 mmol/L 107 106 106  CO2 22 - 32 mmol/L 23 25 23   Calcium 8.9 - 10.3 mg/dL 9.1 9.2 9.1  Total Protein 6.5 - 8.1 g/dL 7.2 7.4 7.1  Total Bilirubin 0.3 - 1.2 mg/dL 0.6 0.4 0.5  Alkaline Phos 38 - 126 U/L 45 54 46  AST 15 - 41 U/L 27 21 22   ALT 0 - 44 U/L 29 22 24        DIAGNOSTIC IMAGING:  I have independently reviewed the scans and discussed with the patient.   I have reviewed Venita Lick LPN's note and agree with the documentation.  I personally performed a face-to-face visit, made revisions and my assessment and plan is as follows.    ASSESSMENT & PLAN:   Recurrent breast cancer, right ([facility]) 1.  Recurrent right breast LCIS: - Initially diagnosed with right breast LCIS in 2012, status post lumpectomy, ER/PR positive, did not take tamoxifen. - Recurrent right breast LCIS, status post lumpectomy on 10/08/2016. - Tamoxifen started in September 2018.  She is tolerating it very well.  Minor hot flashes are stable. - Physical examination today did not reveal any palpable masses.  Lumpectomy scars in the upper outer quadrant of the right breast are stable.  No palpable adenopathy. - We reviewed mammogram dated 11/10/2018 which was BI-RADS Category 2. -I  have also reviewed her labs which are within normal limits. - She reportedly had a negative genetic test. -She will be seen back in 6 months for follow-up with repeat physical exam and labs.      Orders placed this encounter:  Orders Placed This Encounter  Procedures  . CBC with Differential/Platelet  . Comprehensive metabolic panel      Derek Jack, MD [place] [phone]

## 2019-04-09 ENCOUNTER — Encounter: Payer: Self-pay | Admitting: Adult Health

## 2019-04-09 ENCOUNTER — Other Ambulatory Visit: Payer: Self-pay

## 2019-04-09 ENCOUNTER — Ambulatory Visit (INDEPENDENT_AMBULATORY_CARE_PROVIDER_SITE_OTHER): Payer: No Typology Code available for payment source | Admitting: Adult Health

## 2019-04-09 VITALS — BP 135/90 | HR 82 | Ht 67.0 in | Wt 227.8 lb

## 2019-04-09 DIAGNOSIS — F419 Anxiety disorder, unspecified: Secondary | ICD-10-CM | POA: Diagnosis not present

## 2019-04-09 DIAGNOSIS — R030 Elevated blood-pressure reading, without diagnosis of hypertension: Secondary | ICD-10-CM

## 2019-04-09 MED ORDER — SERTRALINE HCL 50 MG PO TABS: 50.0000 mg | ORAL_TABLET | Freq: Every day | ORAL | 3 refills | Status: DC

## 2019-04-09 MED FILL — SERTRALINE HCL 50 MG TABLET: 50 | 30 days supply | Qty: 30 | Fill #0

## 2019-04-09 NOTE — Progress Notes (Signed)
  Subjective:     Patient ID: [name], female   DOB: 1970/02/28, 49 y.o.   MRN: [id number]  HPI [name] is a 49 year old white female, married, G2P2, in to discuss increased anxiety, she is seeing EAP at work. She is having panic attacks esp before meetings and will feel hot and hyperventilates, and she over thinks her self. Daughter is home, doing classes on line and she has had some anxiety,but doing well.  PCP is [facility].   Review of Systems  +increased anxiety, but has not been taking Buspar daily  Is having panic attacks, will get hot and hyperventilates  before meetings Over thinks her self and worries She is seeing EAP at work She has gained some weight back , that she had lost, but has had more wine at night lately  Reviewed past medical,surgical, social and family history. Reviewed medications and allergies.      Objective:   Physical Exam BP 135/90 (BP Location: Right Arm, Patient Position: Sitting, Cuff Size: Normal)   Pulse 82   Ht 5\' 7"  (1.702 m)   Wt 227 lb 12.8 oz (103.3 kg)   LMP 03/15/2019 (Exact Date)   BMI 35.68 kg/m  Skin warm and dry.  Lungs: clear to ausculation bilaterally. Cardiovascular: regular rate and rhythm.   Fall risk is low PHQ 2 score is 0.  Assessment:     1. Anxiety Will add Zoloft and take Buspar at least 1 daily  Meds ordered this encounter  Medications  . sertraline (ZOLOFT) 50 MG tablet    Sig: Take 1 tablet (50 mg total) by mouth daily.    Dispense:  30 tablet    Refill:  3    Order Specific Question:   Supervising Provider    Answer:   Elonda Husky, LUTHER H [[id number]]  Continue EAP  2. Elevated BP without diagnosis of hypertension   Will watch for now  Decrease wine   Plan:     Follow up 4/5/21already as appointment for pap and physical then

## 2019-04-13 ENCOUNTER — Other Ambulatory Visit: Payer: No Typology Code available for payment source

## 2019-04-13 ENCOUNTER — Ambulatory Visit: Payer: No Typology Code available for payment source | Attending: Internal Medicine

## 2019-04-13 ENCOUNTER — Other Ambulatory Visit: Payer: Self-pay

## 2019-04-13 DIAGNOSIS — Z20822 Contact with and (suspected) exposure to covid-19: Secondary | ICD-10-CM

## 2019-04-14 LAB — NOVEL CORONAVIRUS, NAA: SARS-CoV-2, NAA: NOT DETECTED

## 2019-05-17 ENCOUNTER — Other Ambulatory Visit: Payer: Self-pay

## 2019-05-17 ENCOUNTER — Encounter: Payer: Self-pay | Admitting: Adult Health

## 2019-05-17 ENCOUNTER — Other Ambulatory Visit (HOSPITAL_COMMUNITY): Payer: Self-pay | Admitting: *Deleted

## 2019-05-17 ENCOUNTER — Ambulatory Visit (INDEPENDENT_AMBULATORY_CARE_PROVIDER_SITE_OTHER): Payer: No Typology Code available for payment source | Admitting: Adult Health

## 2019-05-17 ENCOUNTER — Other Ambulatory Visit (HOSPITAL_COMMUNITY)
Admission: RE | Admit: 2019-05-17 | Discharge: 2019-05-17 | Disposition: A | Discharge status: Home / Self Care | Payer: No Typology Code available for payment source | Source: Ambulatory Visit | Attending: Adult Health | Admitting: Adult Health

## 2019-05-17 ENCOUNTER — Inpatient Hospital Stay (HOSPITAL_COMMUNITY): Payer: No Typology Code available for payment source | Attending: Hematology

## 2019-05-17 VITALS — BP 129/77 | HR 69 | Ht 66.5 in | Wt 222.0 lb

## 2019-05-17 DIAGNOSIS — Z01419 Encounter for gynecological examination (general) (routine) without abnormal findings: Secondary | ICD-10-CM | POA: Diagnosis present

## 2019-05-17 DIAGNOSIS — F419 Anxiety disorder, unspecified: Secondary | ICD-10-CM | POA: Diagnosis not present

## 2019-05-17 DIAGNOSIS — C50911 Malignant neoplasm of unspecified site of right female breast: Secondary | ICD-10-CM

## 2019-05-17 DIAGNOSIS — Z1211 Encounter for screening for malignant neoplasm of colon: Secondary | ICD-10-CM

## 2019-05-17 DIAGNOSIS — Z853 Personal history of malignant neoplasm of breast: Secondary | ICD-10-CM

## 2019-05-17 DIAGNOSIS — Z1212 Encounter for screening for malignant neoplasm of rectum: Secondary | ICD-10-CM | POA: Diagnosis not present

## 2019-05-17 DIAGNOSIS — D0511 Intraductal carcinoma in situ of right breast: Secondary | ICD-10-CM | POA: Insufficient documentation

## 2019-05-17 DIAGNOSIS — Z87891 Personal history of nicotine dependence: Secondary | ICD-10-CM | POA: Insufficient documentation

## 2019-05-17 DIAGNOSIS — N951 Menopausal and female climacteric states: Secondary | ICD-10-CM | POA: Insufficient documentation

## 2019-05-17 LAB — CBC WITH DIFFERENTIAL/PLATELET
Abs Immature Granulocytes: 0.01 10*3/uL (ref 0.00–0.07)
Basophils Absolute: 0.1 10*3/uL (ref 0.0–0.1)
Basophils Relative: 1 %
Eosinophils Absolute: 0.2 10*3/uL (ref 0.0–0.5)
Eosinophils Relative: 2 %
HCT: 43.4 % (ref 36.0–46.0)
Hemoglobin: 14 g/dL (ref 12.0–15.0)
Immature Granulocytes: 0 %
Lymphocytes Relative: 32 %
Lymphs Abs: 2 10*3/uL (ref 0.7–4.0)
MCH: 29.7 pg (ref 26.0–34.0)
MCHC: 32.3 g/dL (ref 30.0–36.0)
MCV: 92.1 fL (ref 80.0–100.0)
Monocytes Absolute: 0.4 10*3/uL (ref 0.1–1.0)
Monocytes Relative: 7 %
Neutro Abs: 3.6 10*3/uL (ref 1.7–7.7)
Neutrophils Relative %: 58 %
Platelets: 220 10*3/uL (ref 150–400)
RBC: 4.71 MIL/uL (ref 3.87–5.11)
RDW: 11.8 % (ref 11.5–15.5)
WBC: 6.3 10*3/uL (ref 4.0–10.5)
nRBC: 0 % (ref 0.0–0.2)

## 2019-05-17 LAB — COMPREHENSIVE METABOLIC PANEL
ALT: 28 U/L (ref 0–44)
AST: 21 U/L (ref 15–41)
Albumin: 3.9 g/dL (ref 3.5–5.0)
Alkaline Phosphatase: 49 U/L (ref 38–126)
Anion gap: 8 (ref 5–15)
BUN: 13 mg/dL (ref 6–20)
CO2: 23 mmol/L (ref 22–32)
Calcium: 9 mg/dL (ref 8.9–10.3)
Chloride: 106 mmol/L (ref 98–111)
Creatinine, Ser: 0.98 mg/dL (ref 0.44–1.00)
GFR calc Af Amer: >60 mL/min (ref >60)
GFR calc non Af Amer: >60 mL/min (ref >60)
Glucose, Bld: 112 mg/dL — ABNORMAL HIGH (ref 70–99)
Potassium: 3.9 mmol/L (ref 3.5–5.1)
Sodium: 137 mmol/L (ref 135–145)
Total Bilirubin: 0.7 mg/dL (ref 0.3–1.2)
Total Protein: 7 g/dL (ref 6.5–8.1)

## 2019-05-17 LAB — HEMOCCULT GUIAC POC 1CARD (OFFICE): Fecal Occult Blood, POC: NEGATIVE

## 2019-05-17 MED ORDER — VENLAFAXINE HCL ER 75 MG PO CP24: 75.0000 mg | ORAL_CAPSULE | Freq: Every day | ORAL | 3 refills | Status: DC

## 2019-05-17 MED ORDER — TAMOXIFEN CITRATE 20 MG PO TABS: 20.0000 mg | ORAL_TABLET | Freq: Every day | ORAL | 2 refills | Status: DC

## 2019-05-17 MED FILL — TAMOXIFEN 20 MG TABLET: 20 | 90 days supply | Qty: 90 | Fill #0

## 2019-05-17 MED FILL — VENLAFAXINE HCL ER 75 MG CA: 75 | 90 days supply | Qty: 90 | Fill #0

## 2019-05-17 NOTE — Progress Notes (Signed)
Patient ID: [name], female   DOB: November 22, 1970, 49 y.o.   MRN: [id number] History of Present Illness:  [name] is a 49 year old white female, married, G2P2 in for well woman gyn exam and pap. She was started on Zoloft the end of February and feels much better, but pharmacist from [facility] says it may decrease tamoxifen, so spoke with  Dr Raliegh Ip will change to Effexor. She continues to talk with EAP.  She says she feels more relaxed, less anxious. PCP is [facility].   Current Medications, Allergies, Past Medical History, Past Surgical History, Family History and Social History were reviewed in [employer] record.     Review of Systems: Patient denies any headaches, hearing loss, fatigue, blurred vision, shortness of breath, chest pain, abdominal pain, problems with bowel movements, urination, or intercourse. No joint pain or mood swings. See HPI for positives.    Physical Exam:BP 129/77 (BP Location: Left Arm, Patient Position: Sitting, Cuff Size: Large)   Pulse 69   Ht 5' 6.5" (1.689 m)   Wt 222 lb (100.7 kg)   LMP 04/21/2019   BMI 35.29 kg/m She has lost 5 lbs and BP is better. General:  Well developed, well nourished, no acute distress Skin:  Warm and dry Neck:  Midline trachea, normal thyroid, good ROM, no lymphadenopathy Lungs; Clear to auscultation bilaterally Breast:  No dominant palpable mass, retraction, or nipple discharge,has scar right breast from lumpectomy. Cardiovascular: Regular rate and rhythm Abdomen:  Soft, non tender, no hepatosplenomegaly Pelvic:  External genitalia is normal in appearance, no lesions.  The vagina is normal in appearance. Urethra has no lesions or masses. The cervix is bulbous.Pap with high risk HPV 16/18 genotyping is performed.  Uterus is felt to be normal size, shape, and contour.  No adnexal masses or tenderness noted.Bladder is non tender, no masses felt. Rectal: Good sphincter tone, no polyps, or hemorrhoids felt.  Hemoccult  negative. Extremities/musculoskeletal:  No swelling or varicosities noted, no clubbing or cyanosis Psych:  No mood changes, alert and cooperative,seems happy Alcohol audit is 6,has decreased wine to 1-2 glasses per day. Fall risk is low PHQ 9 score is 12, no SI feels better she says, will stop Zoloft and start Effexor  Examination chaperoned by Levy Pupa LPN  Impression and Plan: 1. Encounter for gynecological examination with Papanicolaou smear of cervix Pap sent Physical in 1 year Pap in 3 if normal Mammogram yearly   2. Screening for colorectal cancer Colonoscopy at 50  3. Anxiety Stop Zoloft and will Rx Effexor Continue Buspar Meds ordered this encounter  Medications  . venlafaxine XR (EFFEXOR-XR) 75 MG 24 hr capsule    Sig: Take 1 capsule (75 mg total) by mouth daily with breakfast.    Dispense:  90 capsule    Refill:  3    Order Specific Question:   Supervising Provider    Answer:   Tania Ade H [[id number]]   Follow up in 8 weeks  4. History of right breast cancer Is on tamoxifen, has follow up with Dr Raliegh Ip 05/24/19.

## 2019-05-17 NOTE — Telephone Encounter (Signed)
I have reviewed the chart.  Per Dr. Tomie China last office note, continue Tamoxifen.

## 2019-05-18 LAB — CYTOLOGY - PAP
Comment: NEGATIVE
Diagnosis: NEGATIVE
High risk HPV: NEGATIVE

## 2019-05-19 ENCOUNTER — Ambulatory Visit: Payer: No Typology Code available for payment source | Attending: Internal Medicine

## 2019-05-19 DIAGNOSIS — Z20822 Contact with and (suspected) exposure to covid-19: Secondary | ICD-10-CM

## 2019-05-20 ENCOUNTER — Other Ambulatory Visit: Payer: No Typology Code available for payment source

## 2019-05-20 LAB — SARS-COV-2, NAA 2 DAY TAT

## 2019-05-20 LAB — NOVEL CORONAVIRUS, NAA: SARS-CoV-2, NAA: NOT DETECTED

## 2019-05-24 ENCOUNTER — Encounter (HOSPITAL_COMMUNITY): Payer: Self-pay | Admitting: Hematology

## 2019-05-24 ENCOUNTER — Other Ambulatory Visit: Payer: Self-pay

## 2019-05-24 ENCOUNTER — Inpatient Hospital Stay (HOSPITAL_BASED_OUTPATIENT_CLINIC_OR_DEPARTMENT_OTHER): Payer: No Typology Code available for payment source | Admitting: Hematology

## 2019-05-24 VITALS — BP 117/75 | HR 78 | Temp 97.1°F | Resp 19 | Wt 221.9 lb

## 2019-05-24 DIAGNOSIS — Z1231 Encounter for screening mammogram for malignant neoplasm of breast: Secondary | ICD-10-CM

## 2019-05-24 DIAGNOSIS — C50911 Malignant neoplasm of unspecified site of right female breast: Secondary | ICD-10-CM | POA: Diagnosis not present

## 2019-05-24 DIAGNOSIS — D0511 Intraductal carcinoma in situ of right breast: Secondary | ICD-10-CM | POA: Diagnosis present

## 2019-05-24 DIAGNOSIS — N951 Menopausal and female climacteric states: Secondary | ICD-10-CM | POA: Diagnosis not present

## 2019-05-24 DIAGNOSIS — Z87891 Personal history of nicotine dependence: Secondary | ICD-10-CM | POA: Diagnosis not present

## 2019-05-24 NOTE — Patient Instructions (Signed)
[place] at [facility] Discharge Instructions  You were seen today by Dr. Delton Coombes. He went over your recent lab results. He will schedule you for a MRI of your breast and follow up with you by phone. He will schedule you for a mammogram in September. He will see you back in 6 months for labs and follow up.   Thank you for choosing [facility] at [facility] to provide your oncology and hematology care.  To afford each patient quality time with our provider, please arrive at least 15 minutes before your scheduled appointment time.   If you have a lab appointment with the [facility] please come in thru the  Main Entrance and check in at the main information desk  You need to re-schedule your appointment should you arrive 10 or more minutes late.  We strive to give you quality time with our providers, and arriving late affects you and other patients whose appointments are after yours.  Also, if you no show three or more times for appointments you may be dismissed from the clinic at the providers discretion.     Again, thank you for choosing [facility].  Our hope is that these requests will decrease the amount of time that you wait before being seen by our physicians.       _____________________________________________________________  Should you have questions after your visit to [facility], please contact our office at [phone] between the hours of 8:00 a.m. and 4:30 p.m.  Voicemails left after 4:00 p.m. will not be returned until the following business day.  For prescription refill requests, have your pharmacy contact our office and allow 72 hours.    Cancer Center Support Programs:   > Cancer Support Group  2nd Tuesday of the month 1pm-2pm, Journey Room

## 2019-05-24 NOTE — Progress Notes (Signed)
[facility], [place] [address]   CLINIC:  Medical Oncology/Hematology  PCP:  Rory Percy, MD [place] [address] [phone]   REASON FOR VISIT:  Follow-up for Lobular carcinoma in situ of right breast  CURRENT THERAPY:  Tamoxifen.     INTERVAL HISTORY:  [name] 49 y.o. female seen for follow-up of the LCIS of the right breast.  She occasionally forgets to take tamoxifen.  However she has been taking it on a more regular basis lately.  She reports hot flashes 1-2 times per day.  No hot flashes at night reported.  No musculoskeletal pains.  No vaginal bleeding.  She reportedly had Pap smear in the last 2 weeks.Denies any new onset pains.  REVIEW OF SYSTEMS:  Review of Systems  Endocrine: Positive for hot flashes.  All other systems reviewed and are negative.    PAST MEDICAL/SURGICAL HISTORY:  Past Medical History:  Diagnosis Date  . Breast cancer ([place]) [id number]   rt breast  . Breast disorder    cancer right breast  . Cancer ([facility])    right lobular carcinoma in situ  . Gastroparesis    controlled   Past Surgical History:  Procedure Laterality Date  . BACK SURGERY  10/2002   lumbar hemilaminectomy, microdiscectomy L5-S1  . BREAST BIOPSY Right 07/2016   malignant; waiting on MRI results  . BREAST LUMPECTOMY Right 2012,2018  . BREAST LUMPECTOMY WITH RADIOACTIVE SEED LOCALIZATION Right 10/08/2016   Procedure: RIGHT BREAST LUMPECTOMY WITH RADIOACTIVE SEED LOCALIZATION;  Surgeon: Fanny Skates, MD;  Location: [facility];  Service: General;  Laterality: Right;  . BREAST SURGERY    . TUBAL LIGATION  2001     SOCIAL HISTORY:  Social History   Socioeconomic History  . Marital status: Married    Spouse name: Not on file  . Number of children: Not on file  . Years of education: Not on file  . Highest education level: Not on file  Occupational History  . Not on file  Tobacco Use  . Smoking status:  Former Smoker    Packs/day: 1.00    Years: 16.00    Pack years: 16.00    Types: Cigarettes    Quit date: 08/12/2000    Years since quitting: 18.7  . Smokeless tobacco: Never Used  . Tobacco comment: quit 10 yrs  Substance and Sexual Activity  . Alcohol use: Yes    Comment: occasionally  . Drug use: No  . Sexual activity: Yes    Birth control/protection: Surgical    Comment: tubal  Other Topics Concern  . Not on file  Social History Narrative  . Not on file   Social Determinants of Health   Financial Resource Strain: Low Risk   . Difficulty of Paying Living Expenses: Not hard at all  Food Insecurity: No Food Insecurity  . Worried About Charity fundraiser in the Last Year: Never true  . Ran Out of Food in the Last Year: Never true  Transportation Needs: No Transportation Needs  . Lack of Transportation (Medical): No  . Lack of Transportation (Non-Medical): No  Physical Activity: Insufficiently Active  . Days of Exercise per Week: 2 days  . Minutes of Exercise per Session: 30 min  Stress: Stress Concern Present  . Feeling of Stress : Very much  Social Connections: Not Isolated  . Frequency of Communication with Friends and Family: More than three times a week  .  Frequency of Social Gatherings with Friends and Family: More than three times a week  . Attends Religious Services: 1 to 4 times per year  . Active Member of Clubs or Organizations: Yes  . Attends Archivist Meetings: More than 4 times per year  . Marital Status: Married  Human resources officer Violence: Not At Risk  . Fear of Current or Ex-Partner: No  . Emotionally Abused: No  . Physically Abused: No  . Sexually Abused: No    FAMILY HISTORY:  Family History  Problem Relation Age of Onset  . Hypertension Mother   . Hyperlipidemia Mother   . Diverticulitis Mother   . Hernia Mother   . Heart disease Father        arrythmia  . Hypertension Father   . Cancer Maternal Uncle        pancreatic  .  Congestive Heart Failure Paternal Grandmother   . Heart attack Maternal Grandmother   . Aneurysm Maternal Grandfather   . Arthritis Brother     CURRENT MEDICATIONS:  Outpatient Encounter Medications as of 05/24/2019  Medication Sig  . busPIRone (BUSPAR) 7.5 MG tablet Take 1 tablet (7.5 mg total) by mouth 2 (two) times daily.  . tamoxifen (NOLVADEX) 20 MG tablet Take 1 tablet (20 mg total) by mouth daily.  Marland Kitchen venlafaxine XR (EFFEXOR-XR) 75 MG 24 hr capsule Take 1 capsule (75 mg total) by mouth daily with breakfast.  . ibuprofen (ADVIL,MOTRIN) 200 MG tablet Take 200 mg by mouth every 6 (six) hours as needed.   No facility-administered encounter medications on file as of 05/24/2019.    ALLERGIES:  Allergies  Allergen Reactions  . Augmentin [Amoxicillin-Pot Clavulanate] Hives     PHYSICAL EXAM:  ECOG Performance status: 0  Vitals:   05/24/19 0802  BP: 117/75  Pulse: 78  Resp: 19  Temp: (!) 97.1 F (36.2 C)  SpO2: 100%   Filed Weights   05/24/19 0802  Weight: 221 lb 14.4 oz (100.7 kg)    Physical Exam Vitals reviewed.  Constitutional:      Appearance: Normal appearance.  Cardiovascular:     Rate and Rhythm: Normal rate and regular rhythm.     Heart sounds: Normal heart sounds.  Pulmonary:     Breath sounds: Normal breath sounds.  Abdominal:     General: There is no distension.     Palpations: Abdomen is soft. There is no mass.     Tenderness: There is no abdominal tenderness.  Musculoskeletal:        General: No swelling.  Skin:    General: Skin is warm.  Neurological:     General: No focal deficit present.     Mental Status: She is alert and oriented to person, place, and time.  Psychiatric:        Mood and Affect: Mood normal.        Behavior: Behavior normal.   Right lumpectomy scars in the upper outer quadrant stable.  No palpable masses in bilateral breast.  No palpable adenopathy.   LABORATORY DATA:  I have reviewed the labs as listed.  CBC      Component Value Date/Time   WBC 6.3 05/17/2019 1132   RBC 4.71 05/17/2019 1132   HGB 14.0 05/17/2019 1132   HGB 13.6 04/01/2012 1327   HCT 43.4 05/17/2019 1132   HCT 39.8 04/01/2012 1327   PLT 220 05/17/2019 1132   PLT 178 04/01/2012 1327   MCV 92.1 05/17/2019 1132   MCV 89.8 04/01/2012  1327   MCH 29.7 05/17/2019 1132   MCHC 32.3 05/17/2019 1132   RDW 11.8 05/17/2019 1132   RDW 12.6 04/01/2012 1327   LYMPHSABS 2.0 05/17/2019 1132   LYMPHSABS 1.8 04/01/2012 1327   MONOABS 0.4 05/17/2019 1132   MONOABS 0.5 04/01/2012 1327   EOSABS 0.2 05/17/2019 1132   EOSABS 0.1 04/01/2012 1327   BASOSABS 0.1 05/17/2019 1132   BASOSABS 0.0 04/01/2012 1327   CMP Latest Ref Rng & Units 05/17/2019 11/13/2018 05/18/2018  Glucose 70 - 99 mg/dL 112(H) 86 132(H)  BUN 6 - 20 mg/dL 13 16 20   Creatinine 0.44 - 1.00 mg/dL 0.98 1.00 0.91  Sodium 135 - 145 mmol/L 137 139 139  Potassium 3.5 - 5.1 mmol/L 3.9 3.8 4.0  Chloride 98 - 111 mmol/L 106 107 106  CO2 22 - 32 mmol/L 23 23 25   Calcium 8.9 - 10.3 mg/dL 9.0 9.1 9.2  Total Protein 6.5 - 8.1 g/dL 7.0 7.2 7.4  Total Bilirubin 0.3 - 1.2 mg/dL 0.7 0.6 0.4  Alkaline Phos 38 - 126 U/L 49 45 54  AST 15 - 41 U/L 21 27 21   ALT 0 - 44 U/L 28 29 22        DIAGNOSTIC IMAGING:  I have reviewed the scans.   I have reviewed Venita Lick LPN's note and agree with the documentation.  I personally performed a face-to-face visit, made revisions and my assessment and plan is as follows.    ASSESSMENT & PLAN:   Recurrent breast cancer, right ([facility]) 1.  Recurrent right breast LCIS: -Initially diagnosed with right breast LCIS in 2012, status post lumpectomy, ER/PR positive, did not take tamoxifen. -Recurrent right breast LCIS, status post lumpectomy on 10/08/2016. -Tamoxifen started in September 2018. -She reports that she has not been taking tamoxifen on a regular basis as she forgets.  However she is doing much better lately. -She is experiencing 1-2 hot flashes  during the daytime.  No heart flashes at nighttime. -She had a Pap smear beginning of April which was negative.  She does not report any vaginal bleeding. -Last mammogram on 11/10/2018 was BI-RADS Category 2. -Given the high density of her breast, I have recommended periodic MRIs.  We will order a breast MRI and follow-up with a phone visit. -Otherwise I will see her back in 6 months for follow-up with repeat labs.  She will also have mammogram at that time.      Orders placed this encounter:  Orders Placed This Encounter  Procedures  . MR BREAST BILATERAL W WO CONTRAST INC CAD  . MM 3D SCREEN BREAST BILATERAL  . CBC with Differential/Platelet  . Comprehensive metabolic panel      Derek Jack, MD [place] [phone]

## 2019-05-24 NOTE — Assessment & Plan Note (Signed)
1.  Recurrent right breast LCIS: -Initially diagnosed with right breast LCIS in 2012, status post lumpectomy, ER/PR positive, did not take tamoxifen. -Recurrent right breast LCIS, status post lumpectomy on 10/08/2016. -Tamoxifen started in September 2018. -She reports that she has not been taking tamoxifen on a regular basis as she forgets.  However she is doing much better lately. -She is experiencing 1-2 hot flashes during the daytime.  No heart flashes at nighttime. -She had a Pap smear beginning of April which was negative.  She does not report any vaginal bleeding. -Last mammogram on 11/10/2018 was BI-RADS Category 2. -Given the high density of her breast, I have recommended periodic MRIs.  We will order a breast MRI and follow-up with a phone visit. -Otherwise I will see her back in 6 months for follow-up with repeat labs.  She will also have mammogram at that time. 

## 2019-06-04 ENCOUNTER — Other Ambulatory Visit: Payer: Self-pay

## 2019-06-04 ENCOUNTER — Ambulatory Visit (HOSPITAL_COMMUNITY)
Admission: RE | Admit: 2019-06-04 | Discharge: 2019-06-04 | Disposition: A | Discharge status: Home / Self Care | Payer: No Typology Code available for payment source | Source: Ambulatory Visit | Attending: Hematology | Admitting: Hematology

## 2019-06-04 DIAGNOSIS — C50911 Malignant neoplasm of unspecified site of right female breast: Secondary | ICD-10-CM | POA: Diagnosis present

## 2019-06-04 MED ORDER — GADOBUTROL 1 MMOL/ML IV SOLN
10.0000 mL | Freq: Once | INTRAVENOUS | Status: AC | PRN
  Administered 2019-06-04: 10 mL via INTRAVENOUS

## 2019-06-08 ENCOUNTER — Inpatient Hospital Stay (HOSPITAL_BASED_OUTPATIENT_CLINIC_OR_DEPARTMENT_OTHER): Payer: No Typology Code available for payment source | Admitting: Hematology

## 2019-06-08 DIAGNOSIS — N951 Menopausal and female climacteric states: Secondary | ICD-10-CM | POA: Diagnosis not present

## 2019-06-08 DIAGNOSIS — D0511 Intraductal carcinoma in situ of right breast: Secondary | ICD-10-CM

## 2019-06-08 NOTE — Progress Notes (Signed)
Virtual Visit via Telephone Note  I connected with [name] on 06/08/19 at  4:05 PM EDT by telephone and verified that I am speaking with the correct person using two identifiers.   I discussed the limitations, risks, security and privacy concerns of performing an evaluation and management service by telephone and the availability of in person appointments. I also discussed with the patient that there may be a patient responsible charge related to this service. The patient expressed understanding and agreed to proceed.   History of Present Illness: She is seen in our clinic for recurrent right breast LCIS which was initially diagnosed in 2012 and recurrence in August 2018.  Tamoxifen started in September 2018.    Observations/Objective: Reports taking tamoxifen on a regular basis.  Experiencing hot flashes during daytime.  No hot flashes during the nighttime.  Had a Pap smear in April which was negative.  Assessment and Plan:  1.  Recurrent right breast LCIS: -She will continue tamoxifen for 5 years. -We reviewed results of the MRI of bilateral breasts dated 06/04/2019 which was BI-RADS Category 1.    Follow Up Instructions: She will come back in 6 months for follow-up.   I discussed the assessment and treatment plan with the patient. The patient was provided an opportunity to ask questions and all were answered. The patient agreed with the plan and demonstrated an understanding of the instructions.   The patient was advised to call back or seek an in-person evaluation if the symptoms worsen or if the condition fails to improve as anticipated.  I provided 4 minutes of non-face-to-face time during this encounter.   Derek Jack, MD

## 2019-07-13 ENCOUNTER — Ambulatory Visit: Payer: No Typology Code available for payment source | Admitting: Adult Health

## 2019-07-15 ENCOUNTER — Ambulatory Visit: Payer: No Typology Code available for payment source | Admitting: Adult Health

## 2019-07-15 ENCOUNTER — Encounter: Payer: Self-pay | Admitting: Adult Health

## 2019-07-15 VITALS — BP 126/91 | HR 77 | Ht 66.5 in | Wt 226.0 lb

## 2019-07-15 DIAGNOSIS — F419 Anxiety disorder, unspecified: Secondary | ICD-10-CM

## 2019-07-15 NOTE — Progress Notes (Signed)
  Subjective:     Patient ID: [name], female   DOB: Dec 27, 1970, 49 y.o.   MRN: [id number]  HPI [name] is a 49 year old white female, married G2P2 back in follow up on switching from Zoloft to Effexor and is doing good, feels much better. Had 6 episodes with EAP PCP is Dr Nadara Mustard.  Review of Systems Feels much better, less anxiety Reviewed past medical,surgical, social and family history. Reviewed medications and allergies.     Objective:   Physical Exam BP (!) 126/91 (BP Location: Left Arm, Patient Position: Sitting, Cuff Size: Normal)   Pulse 77   Ht 5' 6.5" (1.689 m)   Wt 226 lb (102.5 kg)   LMP 06/21/2019   BMI 35.93 kg/m  Skin warm and dry. Lungs: clear to ausculation bilaterally. Cardiovascular: regular rate and rhythm. PHQ 9 score is 5, which is down from 12 05/17/19.    Assessment:     1. Anxiety Will continue Effexor-XR 75 mg 1 daily, has refills    Plan:     Follow up in 6 months of sooner if needed

## 2019-08-18 ENCOUNTER — Telehealth: Payer: Self-pay | Admitting: Adult Health

## 2019-08-18 MED ORDER — CLARITHROMYCIN 500 MG PO TABS: 500.0000 mg | ORAL_TABLET | Freq: Two times a day (BID) | ORAL | 0 refills | Status: DC

## 2019-08-18 MED ORDER — MUCINEX FAST-MAX CONGEST COUGH 5-10-200 MG PO TABS: ORAL_TABLET | ORAL | Status: DC

## 2019-08-18 NOTE — Telephone Encounter (Signed)
Has had cough and congestion for a week, COVID test was negative, will rx Biaxin,mucinx and increase fluids

## 2019-08-19 MED FILL — TAMOXIFEN 20 MG TABLET: 20 | 90 days supply | Qty: 90 | Fill #1

## 2019-08-19 MED FILL — VENLAFAXINE HCL ER 75 MG CA: 75 | 90 days supply | Qty: 90 | Fill #1

## 2019-11-01 ENCOUNTER — Encounter (HOSPITAL_COMMUNITY): Payer: Self-pay | Admitting: *Deleted

## 2019-11-01 ENCOUNTER — Other Ambulatory Visit (HOSPITAL_COMMUNITY): Payer: Self-pay | Admitting: *Deleted

## 2019-11-01 ENCOUNTER — Other Ambulatory Visit (HOSPITAL_COMMUNITY): Payer: Self-pay | Admitting: Hematology

## 2019-11-01 MED ORDER — VENLAFAXINE HCL ER 150 MG PO CP24: 150.0000 mg | ORAL_CAPSULE | Freq: Every day | ORAL | 2 refills | Status: DC

## 2019-11-01 MED ORDER — TAMOXIFEN CITRATE 10 MG PO TABS: 10.0000 mg | ORAL_TABLET | Freq: Every day | ORAL | 3 refills | Status: DC

## 2019-11-01 MED FILL — VENLAFAXINE HCL ER 150 MG C: 150 | 30 days supply | Qty: 30 | Fill #0

## 2019-11-01 MED FILL — TAMOXIFEN 10 MG TABLET: 10 | 90 days supply | Qty: 90 | Fill #0

## 2019-11-01 NOTE — Progress Notes (Signed)
Patient called stating that she was about to "self-combust".  She states that her hot flashes are daily and are very uncomfortable for her. She also reports elevated blood pressure. She reports that she self discontinued the tamoxifen and has seen a decrease in her blood pressure.  During this same time she has started walking 2-3 days / week, she has also been started on effexor by her PCP for anxiety.  She reports an increase in her weight.    I spoke with Dr. Delton Coombes and he wants patient to decrease dose of tamoxifen to 10 mg daily and to increase her effexor to 150 mg.    I have returned call to patient and encouraged her to take the tamoxifen same time daily and not miss any doses.  It takes consistency to keep her hot flashes under control.  I have advised of the decrease in dose.  I have also advised of the increase in dose of effexor for the hot flashes.  She verbalizes understanding of the above.

## 2019-11-03 ENCOUNTER — Other Ambulatory Visit (HOSPITAL_COMMUNITY): Payer: Self-pay | Admitting: Hematology

## 2019-11-03 DIAGNOSIS — Z9889 Other specified postprocedural states: Secondary | ICD-10-CM

## 2019-11-16 ENCOUNTER — Encounter (HOSPITAL_COMMUNITY): Payer: No Typology Code available for payment source

## 2019-11-16 ENCOUNTER — Inpatient Hospital Stay (HOSPITAL_COMMUNITY): Payer: No Typology Code available for payment source | Attending: Hematology

## 2019-11-16 ENCOUNTER — Ambulatory Visit (HOSPITAL_COMMUNITY): Admission: RE | Admit: 2019-11-16 | Payer: No Typology Code available for payment source | Source: Ambulatory Visit

## 2019-11-16 ENCOUNTER — Ambulatory Visit (HOSPITAL_COMMUNITY): Payer: No Typology Code available for payment source

## 2019-11-16 DIAGNOSIS — N951 Menopausal and female climacteric states: Secondary | ICD-10-CM | POA: Insufficient documentation

## 2019-11-16 DIAGNOSIS — D0511 Intraductal carcinoma in situ of right breast: Secondary | ICD-10-CM | POA: Insufficient documentation

## 2019-11-16 DIAGNOSIS — Z87891 Personal history of nicotine dependence: Secondary | ICD-10-CM | POA: Insufficient documentation

## 2019-11-16 DIAGNOSIS — I10 Essential (primary) hypertension: Secondary | ICD-10-CM | POA: Insufficient documentation

## 2019-11-23 ENCOUNTER — Ambulatory Visit (HOSPITAL_COMMUNITY): Payer: No Typology Code available for payment source

## 2019-11-23 ENCOUNTER — Ambulatory Visit (HOSPITAL_COMMUNITY): Payer: No Typology Code available for payment source | Admitting: Hematology

## 2019-11-23 ENCOUNTER — Other Ambulatory Visit (HOSPITAL_COMMUNITY): Payer: No Typology Code available for payment source

## 2019-11-23 ENCOUNTER — Encounter (HOSPITAL_COMMUNITY): Payer: No Typology Code available for payment source

## 2019-11-25 ENCOUNTER — Ambulatory Visit (HOSPITAL_COMMUNITY): Payer: No Typology Code available for payment source | Admitting: Oncology

## 2019-11-30 ENCOUNTER — Other Ambulatory Visit: Payer: Self-pay

## 2019-11-30 ENCOUNTER — Ambulatory Visit (HOSPITAL_COMMUNITY)
Admission: RE | Admit: 2019-11-30 | Discharge: 2019-11-30 | Disposition: A | Discharge status: Home / Self Care | Payer: No Typology Code available for payment source | Source: Ambulatory Visit | Attending: Hematology | Admitting: Hematology

## 2019-11-30 ENCOUNTER — Inpatient Hospital Stay (HOSPITAL_COMMUNITY): Payer: No Typology Code available for payment source | Attending: Hematology

## 2019-11-30 DIAGNOSIS — C50911 Malignant neoplasm of unspecified site of right female breast: Secondary | ICD-10-CM

## 2019-11-30 DIAGNOSIS — Z9889 Other specified postprocedural states: Secondary | ICD-10-CM

## 2019-11-30 DIAGNOSIS — Z1231 Encounter for screening mammogram for malignant neoplasm of breast: Secondary | ICD-10-CM | POA: Insufficient documentation

## 2019-11-30 DIAGNOSIS — D0501 Lobular carcinoma in situ of right breast: Secondary | ICD-10-CM | POA: Insufficient documentation

## 2019-11-30 LAB — COMPREHENSIVE METABOLIC PANEL
ALT: 32 U/L (ref 0–44)
AST: 25 U/L (ref 15–41)
Albumin: 3.7 g/dL (ref 3.5–5.0)
Alkaline Phosphatase: 45 U/L (ref 38–126)
Anion gap: 9 (ref 5–15)
BUN: 17 mg/dL (ref 6–20)
CO2: 26 mmol/L (ref 22–32)
Calcium: 9.5 mg/dL (ref 8.9–10.3)
Chloride: 104 mmol/L (ref 98–111)
Creatinine, Ser: 0.93 mg/dL (ref 0.44–1.00)
GFR, Estimated: >60 mL/min (ref >60)
Glucose, Bld: 112 mg/dL — ABNORMAL HIGH (ref 70–99)
Potassium: 5.3 mmol/L — ABNORMAL HIGH (ref 3.5–5.1)
Sodium: 139 mmol/L (ref 135–145)
Total Bilirubin: 0.6 mg/dL (ref 0.3–1.2)
Total Protein: 7.3 g/dL (ref 6.5–8.1)

## 2019-11-30 LAB — CBC WITH DIFFERENTIAL/PLATELET
Abs Immature Granulocytes: 0.02 10*3/uL (ref 0.00–0.07)
Basophils Absolute: 0 10*3/uL (ref 0.0–0.1)
Basophils Relative: 0 %
Eosinophils Absolute: 0 10*3/uL (ref 0.0–0.5)
Eosinophils Relative: 0 %
HCT: 43.3 % (ref 36.0–46.0)
Hemoglobin: 14 g/dL (ref 12.0–15.0)
Immature Granulocytes: 0 %
Lymphocytes Relative: 31 %
Lymphs Abs: 1.8 10*3/uL (ref 0.7–4.0)
MCH: 30.7 pg (ref 26.0–34.0)
MCHC: 32.3 g/dL (ref 30.0–36.0)
MCV: 95 fL (ref 80.0–100.0)
Monocytes Absolute: 0.5 10*3/uL (ref 0.1–1.0)
Monocytes Relative: 9 %
Neutro Abs: 3.5 10*3/uL (ref 1.7–7.7)
Neutrophils Relative %: 60 %
Platelets: 212 10*3/uL (ref 150–400)
RBC: 4.56 MIL/uL (ref 3.87–5.11)
RDW: 12.1 % (ref 11.5–15.5)
WBC: 5.8 10*3/uL (ref 4.0–10.5)
nRBC: 0 % (ref 0.0–0.2)

## 2019-12-01 ENCOUNTER — Other Ambulatory Visit: Payer: No Typology Code available for payment source

## 2019-12-01 DIAGNOSIS — Z20822 Contact with and (suspected) exposure to covid-19: Secondary | ICD-10-CM

## 2019-12-02 LAB — SARS-COV-2, NAA 2 DAY TAT

## 2019-12-02 LAB — NOVEL CORONAVIRUS, NAA: SARS-CoV-2, NAA: NOT DETECTED

## 2019-12-05 NOTE — Progress Notes (Signed)
[name], [place] [address]   CLINIC:  Medical Oncology/Hematology  PCP:  Rory Percy, MD [place] [address] [phone]   REASON FOR VISIT:  Follow-up for Lobular carcinoma in situ of right breast  CURRENT THERAPY:  Tamoxifen.   INTERVAL HISTORY:  [name] 49 y.o. female seen for follow-up of the LCIS of the right breast. Tamoxifin recently decreased secondary to hot flashes and hypertension. She was also started on Effexor for anxiety. She recently had a mammogram.  Overall she is feeling well.  Her hot flashes have improved since the reduction of her tamoxifen.  She also believes she is going through menopause.  She has not had a period in over 2 months.  She has noticed that her blood pressure has been on the higher side over the past few months.  She feels it is improving since starting Effexor.  Her appetite is good.  Her weight is stable.  Energy levels are low but this is a chronic problem.  Has occasional bleeding secondary to hemorrhoids with bowel movements.   REVIEW OF SYSTEMS:  Review of Systems  Constitutional: Positive for fatigue.  Gastrointestinal: Positive for blood in stool.  Endocrine: Positive for hot flashes.  Neurological: Positive for dizziness.     PAST MEDICAL/SURGICAL HISTORY:  Past Medical History:  Diagnosis Date  . Breast cancer ([facility]) [id number]   rt breast  . Breast disorder    cancer right breast  . Cancer ([facility])    right lobular carcinoma in situ  . Gastroparesis    controlled   Past Surgical History:  Procedure Laterality Date  . BACK SURGERY  10/2002   lumbar hemilaminectomy, microdiscectomy L5-S1  . BREAST BIOPSY Right 07/2016   malignant; waiting on MRI results  . BREAST LUMPECTOMY Right 2012,2018  . BREAST LUMPECTOMY WITH RADIOACTIVE SEED LOCALIZATION Right 10/08/2016   Procedure: RIGHT BREAST LUMPECTOMY WITH RADIOACTIVE SEED LOCALIZATION;  Surgeon: Fanny Skates, MD;   Location: [facility];  Service: General;  Laterality: Right;  . BREAST SURGERY    . TUBAL LIGATION  2001     SOCIAL HISTORY:  Social History   Socioeconomic History  . Marital status: Married    Spouse name: Not on file  . Number of children: Not on file  . Years of education: Not on file  . Highest education level: Not on file  Occupational History  . Not on file  Tobacco Use  . Smoking status: Former Smoker    Packs/day: 1.00    Years: 16.00    Pack years: 16.00    Types: Cigarettes    Quit date: 08/12/2000    Years since quitting: 19.3  . Smokeless tobacco: Never Used  . Tobacco comment: quit 10 yrs  Vaping Use  . Vaping Use: Never used  Substance and Sexual Activity  . Alcohol use: Yes    Comment: occasionally  . Drug use: No  . Sexual activity: Yes    Birth control/protection: Surgical    Comment: tubal  Other Topics Concern  . Not on file  Social History Narrative  . Not on file   Social Determinants of Health   Financial Resource Strain: Low Risk   . Difficulty of Paying Living Expenses: Not hard at all  Food Insecurity: No Food Insecurity  . Worried About Charity fundraiser in the Last Year: Never true  . Ran Out of Food in the Last Year: Never true  Transportation Needs: No Transportation Needs  . Lack of Transportation (Medical): No  . Lack of Transportation (Non-Medical): No  Physical Activity: Insufficiently Active  . Days of Exercise per Week: 2 days  . Minutes of Exercise per Session: 30 min  Stress: Stress Concern Present  . Feeling of Stress : Very much  Social Connections: Socially Integrated  . Frequency of Communication with Friends and Family: More than three times a week  . Frequency of Social Gatherings with Friends and Family: More than three times a week  . Attends Religious Services: 1 to 4 times per year  . Active Member of Clubs or Organizations: Yes  . Attends Archivist Meetings: More than 4 times per  year  . Marital Status: Married  Human resources officer Violence: Not At Risk  . Fear of Current or Ex-Partner: No  . Emotionally Abused: No  . Physically Abused: No  . Sexually Abused: No    FAMILY HISTORY:  Family History  Problem Relation Age of Onset  . Hypertension Mother   . Hyperlipidemia Mother   . Diverticulitis Mother   . Hernia Mother   . Heart disease Father        arrythmia  . Hypertension Father   . Cancer Maternal Uncle        pancreatic  . Congestive Heart Failure Paternal Grandmother   . Heart attack Maternal Grandmother   . Aneurysm Maternal Grandfather   . Arthritis Brother     CURRENT MEDICATIONS:  Outpatient Encounter Medications as of 12/06/2019  Medication Sig  . clarithromycin (BIAXIN) 500 MG tablet Take 1 tablet (500 mg total) by mouth 2 (two) times daily.  Marland Kitchen ibuprofen (ADVIL,MOTRIN) 200 MG tablet Take 200 mg by mouth every 6 (six) hours as needed.  Marland Kitchen Phenylephrine-DM-GG (MUCINEX FAST-MAX CONGEST COUGH) 5-10-200 MG TABS Take as directed on package  . tamoxifen (NOLVADEX) 10 MG tablet Take 1 tablet (10 mg total) by mouth daily.  Marland Kitchen venlafaxine XR (EFFEXOR-XR) 150 MG 24 hr capsule Take 1 capsule (150 mg total) by mouth daily with breakfast.   No facility-administered encounter medications on file as of 12/06/2019.    ALLERGIES:  Allergies  Allergen Reactions  . Augmentin [Amoxicillin-Pot Clavulanate] Hives     PHYSICAL EXAM:  ECOG Performance status: 0  Vitals:   12/06/19 0907  BP: 130/88  Pulse: 74  Resp: 18  Temp: (!) 97.3 F (36.3 C)  SpO2: 100%   Filed Weights   12/06/19 0907  Weight: 228 lb 9.9 oz (103.7 kg)    Physical Exam Vitals reviewed.  Constitutional:      Appearance: Normal appearance.  HENT:     Head: Normocephalic and atraumatic.  Eyes:     Pupils: Pupils are equal, round, and reactive to light.  Cardiovascular:     Rate and Rhythm: Normal rate and regular rhythm.     Heart sounds: Normal heart sounds. No murmur  heard.   Pulmonary:     Effort: Pulmonary effort is normal.     Breath sounds: Normal breath sounds. No wheezing.  Abdominal:     General: Bowel sounds are normal. There is no distension.     Palpations: Abdomen is soft. There is no mass.     Tenderness: There is no abdominal tenderness.  Musculoskeletal:        General: No swelling. Normal range of motion.     Cervical back: Normal range of motion.  Skin:    General: Skin is warm and dry.  Findings: No rash.  Neurological:     General: No focal deficit present.     Mental Status: She is alert and oriented to person, place, and time.  Psychiatric:        Mood and Affect: Mood normal.        Behavior: Behavior normal.        Judgment: Judgment normal.   Right lumpectomy scars in the upper outer quadrant stable.  No palpable masses in bilateral breast.  No palpable adenopathy.   LABORATORY DATA:  I have reviewed the labs as listed.  CBC    Component Value Date/Time   WBC 5.8 11/30/2019 0945   RBC 4.56 11/30/2019 0945   HGB 14.0 11/30/2019 0945   HGB 13.6 04/01/2012 1327   HCT 43.3 11/30/2019 0945   HCT 39.8 04/01/2012 1327   PLT 212 11/30/2019 0945   PLT 178 04/01/2012 1327   MCV 95.0 11/30/2019 0945   MCV 89.8 04/01/2012 1327   MCH 30.7 11/30/2019 0945   MCHC 32.3 11/30/2019 0945   RDW 12.1 11/30/2019 0945   RDW 12.6 04/01/2012 1327   LYMPHSABS 1.8 11/30/2019 0945   LYMPHSABS 1.8 04/01/2012 1327   MONOABS 0.5 11/30/2019 0945   MONOABS 0.5 04/01/2012 1327   EOSABS 0.0 11/30/2019 0945   EOSABS 0.1 04/01/2012 1327   BASOSABS 0.0 11/30/2019 0945   BASOSABS 0.0 04/01/2012 1327   CMP Latest Ref Rng & Units 11/30/2019 05/17/2019 11/13/2018  Glucose 70 - 99 mg/dL 112(H) 112(H) 86  BUN 6 - 20 mg/dL 17 13 16   Creatinine 0.44 - 1.00 mg/dL 0.93 0.98 1.00  Sodium 135 - 145 mmol/L 139 137 139  Potassium 3.5 - 5.1 mmol/L 5.3(H) 3.9 3.8  Chloride 98 - 111 mmol/L 104 106 107  CO2 22 - 32 mmol/L 26 23 23   Calcium 8.9 - 10.3  mg/dL 9.5 9.0 9.1  Total Protein 6.5 - 8.1 g/dL 7.3 7.0 7.2  Total Bilirubin 0.3 - 1.2 mg/dL 0.6 0.7 0.6  Alkaline Phos 38 - 126 U/L 45 49 45  AST 15 - 41 U/L 25 21 27   ALT 0 - 44 U/L 32 28 29       DIAGNOSTIC IMAGING:  I have reviewed the scans.   I have reviewed Venita Lick LPN's note and agree with the documentation.  I personally performed a face-to-face visit, made revisions and my assessment and plan is as follows.    ASSESSMENT & PLAN: 1.  Recurrent right breast LCIS: -Initially diagnosed with right breast LCIS in 2012, status post lumpectomy, ER/PR positive, did not take tamoxifen. -Recurrent right breast LCIS, status post lumpectomy on 10/08/2016. -Tamoxifen started in September 2018. -Has been compliant with her dose reduced tamoxifen. -Hot flashes have improved.. -She had a Pap smear beginning of April which was negative.  She does not report any vaginal bleeding. -Last mammogram on 11/30/2019-BI-RADS 1 diagnostic mammogram in 1 year. -She will get periodic MRIs given dense breast tissue.  Her surgeon has recommended every other year.  Last MRI was in April 2021.  -Return to clinic in 6 months with repeat lab work and breast exam. -Repeat mammogram in October 2022.     Orders placed this encounter:  No orders of the defined types were placed in this encounter.  Faythe Casa, NP 12/06/2019 3:14 PM  [facility] [phone]

## 2019-12-06 ENCOUNTER — Encounter (HOSPITAL_COMMUNITY): Payer: Self-pay | Admitting: Oncology

## 2019-12-06 ENCOUNTER — Inpatient Hospital Stay (HOSPITAL_BASED_OUTPATIENT_CLINIC_OR_DEPARTMENT_OTHER): Payer: No Typology Code available for payment source | Admitting: Oncology

## 2019-12-06 ENCOUNTER — Other Ambulatory Visit: Payer: Self-pay

## 2019-12-06 DIAGNOSIS — I10 Essential (primary) hypertension: Secondary | ICD-10-CM | POA: Diagnosis not present

## 2019-12-06 DIAGNOSIS — C50911 Malignant neoplasm of unspecified site of right female breast: Secondary | ICD-10-CM | POA: Diagnosis not present

## 2019-12-06 DIAGNOSIS — Z87891 Personal history of nicotine dependence: Secondary | ICD-10-CM | POA: Diagnosis not present

## 2019-12-06 DIAGNOSIS — N951 Menopausal and female climacteric states: Secondary | ICD-10-CM | POA: Diagnosis not present

## 2019-12-06 DIAGNOSIS — D0511 Intraductal carcinoma in situ of right breast: Secondary | ICD-10-CM | POA: Diagnosis not present

## 2019-12-06 NOTE — Assessment & Plan Note (Signed)
1.  Recurrent right breast LCIS: -Initially diagnosed with right breast LCIS in 2012, status post lumpectomy, ER/PR positive, did not take tamoxifen. -Recurrent right breast LCIS, status post lumpectomy on 10/08/2016. -Tamoxifen started in September 2018. -She reports that she has not been taking tamoxifen on a regular basis as she forgets.  However she is doing much better lately. -She is experiencing 1-2 hot flashes during the daytime.  No heart flashes at nighttime. -She had a Pap smear beginning of April which was negative.  She does not report any vaginal bleeding. -Last mammogram on 11/10/2018 was BI-RADS Category 2. -Given the high density of her breast, I have recommended periodic MRIs.  We will order a breast MRI and follow-up with a phone visit. -Otherwise I will see her back in 6 months for follow-up with repeat labs.  She will also have mammogram at that time.

## 2019-12-06 NOTE — Progress Notes (Signed)
Pt is taking Tamoxifen as prescribed with the only side effect being hot flashes.

## 2019-12-06 NOTE — Patient Instructions (Signed)
[facility] at [facility] Discharge Instructions  You saw Jenny Burns, NP, today.   Thank you for choosing [facility] at [facility] to provide your oncology and hematology care.  To afford each patient quality time with our provider, please arrive at least 15 minutes before your scheduled appointment time.   If you have a lab appointment with the Cancer Center please come in thru the Main Entrance and check in at the main information desk.  You need to re-schedule your appointment should you arrive 10 or more minutes late.  We strive to give you quality time with our providers, and arriving late affects you and other patients whose appointments are after yours.  Also, if you no show three or more times for appointments you may be dismissed from the clinic at the providers discretion.     Again, thank you for choosing [facility].  Our hope is that these requests will decrease the amount of time that you wait before being seen by our physicians.       _____________________________________________________________  Should you have questions after your visit to [facility], please contact our office at [phone] and follow the prompts.  Our office hours are 8:00 a.m. and 4:30 p.m. Monday - Friday.  Please note that voicemails left after 4:00 p.m. may not be returned until the following business day.  We are closed weekends and major holidays.  You do have access to a nurse 24-7, just call the main number to the clinic [phone] and do not press any options, hold on the line and a nurse will answer the phone.    For prescription refill requests, have your pharmacy contact our office and allow 72 hours.    Due to Covid, you will need to wear a mask upon entering the hospital. If you do not have a mask, a mask will be given to you at the Main Entrance upon arrival. For doctor visits, patients may have 1 support person age 18  or older with them. For treatment visits, patients can not have anyone with them due to social distancing guidelines and our immunocompromised population.     

## 2020-01-04 ENCOUNTER — Ambulatory Visit: Payer: Self-pay | Admitting: Adult Health

## 2020-01-04 MED FILL — VENLAFAXINE HCL ER 150 MG C: 150 | 30 days supply | Qty: 30 | Fill #1

## 2020-02-07 MED FILL — VENLAFAXINE HCL ER 150 MG C: 150 | 30 days supply | Qty: 30 | Fill #2

## 2020-03-09 ENCOUNTER — Other Ambulatory Visit (HOSPITAL_COMMUNITY): Payer: Self-pay | Admitting: *Deleted

## 2020-03-09 ENCOUNTER — Other Ambulatory Visit (HOSPITAL_COMMUNITY): Payer: Self-pay | Admitting: Hematology

## 2020-03-09 MED ORDER — VENLAFAXINE HCL ER 150 MG PO CP24
150.0000 mg | ORAL_CAPSULE | Freq: Every day | ORAL | 3 refills | Status: DC
Start: 2020-03-09 — End: 2020-03-09

## 2020-03-09 MED FILL — VENLAFAXINE HCL ER 150 MG C: 150 | 90 days supply | Qty: 90 | Fill #0

## 2020-04-10 DIAGNOSIS — H524 Presbyopia: Secondary | ICD-10-CM | POA: Diagnosis not present

## 2020-04-17 ENCOUNTER — Encounter: Payer: Self-pay | Admitting: Adult Health

## 2020-04-17 ENCOUNTER — Ambulatory Visit (INDEPENDENT_AMBULATORY_CARE_PROVIDER_SITE_OTHER): Payer: 59 | Admitting: Adult Health

## 2020-04-17 ENCOUNTER — Other Ambulatory Visit: Payer: Self-pay

## 2020-04-17 VITALS — BP 140/92 | HR 99 | Ht 67.0 in | Wt 234.0 lb

## 2020-04-17 DIAGNOSIS — N390 Urinary tract infection, site not specified: Secondary | ICD-10-CM

## 2020-04-17 DIAGNOSIS — R319 Hematuria, unspecified: Secondary | ICD-10-CM | POA: Diagnosis not present

## 2020-04-17 DIAGNOSIS — R300 Dysuria: Secondary | ICD-10-CM | POA: Diagnosis not present

## 2020-04-17 MED ORDER — SULFAMETHOXAZOLE-TRIMETHOPRIM 800-160 MG PO TABS: 1.0000 | ORAL_TABLET | Freq: Two times a day (BID) | ORAL | 0 refills | Status: DC

## 2020-04-17 MED ORDER — PHENAZOPYRIDINE HCL 200 MG PO TABS: 200.0000 mg | ORAL_TABLET | Freq: Three times a day (TID) | ORAL | 0 refills | Status: DC | PRN

## 2020-04-17 NOTE — Progress Notes (Signed)
  Subjective:     Patient ID: [name], female   DOB: Oct 31, 1970, 50 y.o.   MRN: [id number]  HPI [name] is a 50 year old white female, married, [id number], worked in for burning with urination and blood in urine yesterday. Taking AZO PCP is Dr Nadara Mustard.   Review of Systems Has burning with urination Had bleeding yesterday and bad pain Reviewed past medical,surgical, social and family history. Reviewed medications and allergies.     Objective:   Physical Exam BP (!) 140/92 (BP Location: Left Arm, Patient Position: Sitting, Cuff Size: Normal)   Pulse 99   Ht 5\' 7"  (1.702 m)   Wt 234 lb (106.1 kg)   BMI 36.65 kg/m urine red from AZO Skin warm and dry.  Lungs: clear to ausculation bilaterally. Cardiovascular: regular rate and rhythm. NO CVAT  Upstream - 04/17/20 1523      Pregnancy Intention Screening   Does the patient want to become pregnant in the next year? No    Does the patient's partner want to become pregnant in the next year? No    Would the patient like to discuss contraceptive options today? No      Contraception Wrap Up   Current Method Female Sterilization    End Method Female Sterilization    Contraception Counseling Provided No             Assessment:    1. Dysuria   2. Urinary tract infection with hematuria, site unspecified UA C&S sent Will rx septra ds and pyridium and push fluids Meds ordered this encounter  Medications  . sulfamethoxazole-trimethoprim (BACTRIM DS) 800-160 MG tablet    Sig: Take 1 tablet by mouth 2 (two) times daily. Take 1 bid    Dispense:  14 tablet    Refill:  0    Order Specific Question:   Supervising Provider    Answer:   Elonda Husky, LUTHER H [[id number]]  . phenazopyridine (PYRIDIUM) 200 MG tablet    Sig: Take 1 tablet (200 mg total) by mouth 3 (three) times daily as needed for pain.    Dispense:  10 tablet    Refill:  0    Order Specific Question:   Supervising Provider    Answer:   Florian Buff [[id number]]      Plan:        Follow up  prn

## 2020-04-18 LAB — URINALYSIS
Bilirubin, UA: NEGATIVE
Glucose, UA: NEGATIVE
Ketones, UA: NEGATIVE
Nitrite, UA: POSITIVE — AB
Specific Gravity, UA: 1.013 (ref 1.005–1.030)
Urobilinogen, Ur: 1 mg/dL (ref 0.2–1.0)
pH, UA: 5.5 (ref 5.0–7.5)

## 2020-04-19 LAB — URINE CULTURE

## 2020-05-09 ENCOUNTER — Telehealth: Payer: 59 | Admitting: Emergency Medicine

## 2020-05-09 DIAGNOSIS — J301 Allergic rhinitis due to pollen: Secondary | ICD-10-CM

## 2020-05-09 MED ORDER — PREDNISONE 20 MG PO TABS: 40.0000 mg | ORAL_TABLET | Freq: Every day | ORAL | 0 refills | Status: DC

## 2020-05-09 MED ORDER — BENZONATATE 100 MG PO CAPS: 100.0000 mg | ORAL_CAPSULE | Freq: Two times a day (BID) | ORAL | 0 refills | Status: DC | PRN

## 2020-05-09 NOTE — Patient Instructions (Signed)
   Based on what you have shared with me it looks like you have Allergic Rhinitis.  Rhinitis is when a reaction occurs that causes nasal congestion, runny nose, sneezing, and itching.  Most types of rhinitis are caused by an inflammation and are associated with symptoms in the eyes ears or throat. There are several types of rhinitis.  The most common are acute rhinitis, which is usually caused by a viral illness, allergic or seasonal rhinitis, and nonallergic or year-round rhinitis.  Nasal allergies occur certain times of the year.  Allergic rhinitis is caused when allergens in the air trigger the release of histamine in the body.  Histamine causes itching, swelling, and fluid to build up in the fragile linings of the nasal passages, sinuses and eyelids.  An itchy nose and clear discharge are common.  Based on the symptoms moving into your chest, you may benefit from a short steroid course.  I've sent this to the [facility].  HOME CARE:   You can use an over-the-counter saline nasal spray as needed  Avoid areas where there is heavy dust, mites, or molds  Stay indoors on windy days during the pollen season  Keep windows closed in home, at least in bedroom; use air conditioner.  Use high-efficiency house air filter  Keep windows closed in car, turn AC on re-circulate  Avoid playing out with dog during pollen season  GET HELP RIGHT AWAY IF:   If your symptoms do not improve within 10 days  You become short of breath  You develop yellow or green discharge from your nose for over 3 days  You have coughing fits  MAKE SURE YOU:   Understand these instructions  Will watch your condition  Will get help right away if you are not doing well or get worse

## 2020-05-09 NOTE — Progress Notes (Signed)
[name], fikes are scheduled for a virtual visit with your provider today.    Just as we do with appointments in the office, we must obtain your consent to participate.  Your consent will be active for this visit and any virtual visit you may have with one of our providers in the next 365 days.    If you have a MyChart account, I can also send a copy of this consent to you electronically.  All virtual visits are billed to your insurance company just like a traditional visit in the office.  As this is a virtual visit, video technology does not allow for your provider to perform a traditional examination.  This may limit your provider's ability to fully assess your condition.  If your provider identifies any concerns that need to be evaluated in person or the need to arrange testing such as labs, EKG, etc, we will make arrangements to do so.    Although advances in technology are sophisticated, we cannot ensure that it will always work on either your end or our end.  If the connection with a video visit is poor, we may have to switch to a telephone visit.  With either a video or telephone visit, we are not always able to ensure that we have a secure connection.   I need to obtain your verbal consent now.   Are you willing to proceed with your visit today?   [name] has provided verbal consent on 05/09/2020 for a virtual visit (video or telephone).   [name], PA-C 05/09/2020  8:36 AM     Virtual Visit via Video   I connected with patient on 05/09/20 at  8:45 AM EDT by a video enabled telemedicine application and verified that I am speaking with the correct person using two identifiers.  Location patient: Home Location provider: [place] participating in the virtual visit: Patient, Provider  I discussed the limitations of evaluation and management by telemedicine and the availability of in person appointments. The patient expressed understanding and agreed to  proceed.  Subjective:   HPI:   Patient presents via [facility] today with chief complaint of nasal congestion and allergies that started about 3-4 days ago. Took home covid test that was negative.  Patient reports having had low grade fever today.  Reports that her symptoms are now moving down into the chest.  Reports cough.  States that she is concerned that it could be allergies trying to turn into bronchitis.  ROS:   See pertinent positives and negatives per HPI.  Patient Active Problem List   Diagnosis Date Noted  . Urinary tract infection with hematuria 04/17/2020  . Dysuria 04/17/2020  . Screening for colorectal cancer 05/17/2019  . Anxiety 04/09/2019  . Elevated BP without diagnosis of hypertension 04/09/2019  . History of right breast cancer 09/02/2016  . Encounter for gynecological examination with Papanicolaou smear of cervix 09/02/2016  . Pelvic pain 09/02/2016  . Recurrent breast cancer, right ([place]) 09/02/2016  . SUI (stress urinary incontinence, female) 09/02/2016  . Lobular carcinoma in situ of right breast 12/03/2010    Social History   Tobacco Use  . Smoking status: Former Smoker    Packs/day: 1.00    Years: 16.00    Pack years: 16.00    Types: Cigarettes    Quit date: 08/12/2000    Years since quitting: 19.7  . Smokeless tobacco: Never Used  . Tobacco comment: quit 10 yrs  Substance Use  Topics  . Alcohol use: Yes    Comment: occasionally    Current Outpatient Medications:  .  ibuprofen (ADVIL,MOTRIN) 200 MG tablet, Take 200 mg by mouth every 6 (six) hours as needed. (Patient not taking: Reported on 04/17/2020), Disp: , Rfl:  .  phenazopyridine (PYRIDIUM) 200 MG tablet, Take 1 tablet (200 mg total) by mouth 3 (three) times daily as needed for pain., Disp: 10 tablet, Rfl: 0 .  sulfamethoxazole-trimethoprim (BACTRIM DS) 800-160 MG tablet, Take 1 tablet by mouth 2 (two) times daily. Take 1 bid, Disp: 14 tablet, Rfl: 0 .  tamoxifen (NOLVADEX) 10 MG tablet, Take  1 tablet (10 mg total) by mouth daily., Disp: 90 tablet, Rfl: 3 .  venlafaxine XR (EFFEXOR-XR) 150 MG 24 hr capsule, Take 1 capsule (150 mg total) by mouth daily with breakfast., Disp: 90 capsule, Rfl: 3  Allergies  Allergen Reactions  . Augmentin [Amoxicillin-Pot Clavulanate] Hives    Objective:   There were no vitals taken for this visit.  Patient is well-developed, well-nourished in no acute distress.  Resting comfortably at home.  Head is normocephalic, atraumatic.  No labored breathing, but does sound congested.  Speech is clear and coherent with logical content.  Patient is alert and oriented at baseline.    Assessment and Plan:   Cough, nasal congestion.  Short course of prednisone, tessalon for cough, continue nasal saline and flonase.   [name], PA-C 05/09/2020

## 2020-05-23 ENCOUNTER — Other Ambulatory Visit (HOSPITAL_COMMUNITY): Payer: Self-pay

## 2020-06-02 ENCOUNTER — Other Ambulatory Visit (HOSPITAL_COMMUNITY): Payer: Self-pay

## 2020-06-02 DIAGNOSIS — C50911 Malignant neoplasm of unspecified site of right female breast: Secondary | ICD-10-CM

## 2020-06-05 ENCOUNTER — Inpatient Hospital Stay (HOSPITAL_COMMUNITY): Payer: 59 | Attending: Hematology

## 2020-06-05 ENCOUNTER — Other Ambulatory Visit: Payer: Self-pay

## 2020-06-05 DIAGNOSIS — D0501 Lobular carcinoma in situ of right breast: Secondary | ICD-10-CM | POA: Insufficient documentation

## 2020-06-05 DIAGNOSIS — C50911 Malignant neoplasm of unspecified site of right female breast: Secondary | ICD-10-CM

## 2020-06-05 LAB — COMPREHENSIVE METABOLIC PANEL
ALT: 30 U/L (ref 0–44)
AST: 26 U/L (ref 15–41)
Albumin: 4 g/dL (ref 3.5–5.0)
Alkaline Phosphatase: 59 U/L (ref 38–126)
Anion gap: 9 (ref 5–15)
BUN: 14 mg/dL (ref 6–20)
CO2: 23 mmol/L (ref 22–32)
Calcium: 9 mg/dL (ref 8.9–10.3)
Chloride: 103 mmol/L (ref 98–111)
Creatinine, Ser: 0.93 mg/dL (ref 0.44–1.00)
GFR, Estimated: >60 mL/min (ref >60)
Glucose, Bld: 113 mg/dL — ABNORMAL HIGH (ref 70–99)
Potassium: 4.7 mmol/L (ref 3.5–5.1)
Sodium: 135 mmol/L (ref 135–145)
Total Bilirubin: 0.7 mg/dL (ref 0.3–1.2)
Total Protein: 7.3 g/dL (ref 6.5–8.1)

## 2020-06-05 LAB — CBC WITH DIFFERENTIAL/PLATELET
Abs Immature Granulocytes: 0.01 10*3/uL (ref 0.00–0.07)
Basophils Absolute: 0 10*3/uL (ref 0.0–0.1)
Basophils Relative: 0 %
Eosinophils Absolute: 0 10*3/uL (ref 0.0–0.5)
Eosinophils Relative: 0 %
HCT: 45.3 % (ref 36.0–46.0)
Hemoglobin: 14.9 g/dL (ref 12.0–15.0)
Immature Granulocytes: 0 %
Lymphocytes Relative: 40 %
Lymphs Abs: 1.9 10*3/uL (ref 0.7–4.0)
MCH: 31.6 pg (ref 26.0–34.0)
MCHC: 32.9 g/dL (ref 30.0–36.0)
MCV: 96 fL (ref 80.0–100.0)
Monocytes Absolute: 0.4 10*3/uL (ref 0.1–1.0)
Monocytes Relative: 9 %
Neutro Abs: 2.5 10*3/uL (ref 1.7–7.7)
Neutrophils Relative %: 51 %
Platelets: 196 10*3/uL (ref 150–400)
RBC: 4.72 MIL/uL (ref 3.87–5.11)
RDW: 12.2 % (ref 11.5–15.5)
WBC: 4.8 10*3/uL (ref 4.0–10.5)
nRBC: 0 % (ref 0.0–0.2)

## 2020-06-12 ENCOUNTER — Other Ambulatory Visit (HOSPITAL_COMMUNITY): Payer: Self-pay

## 2020-06-12 ENCOUNTER — Inpatient Hospital Stay (HOSPITAL_COMMUNITY): Payer: 59 | Attending: Hematology | Admitting: Hematology

## 2020-06-12 ENCOUNTER — Other Ambulatory Visit: Payer: Self-pay

## 2020-06-12 ENCOUNTER — Encounter (HOSPITAL_COMMUNITY): Payer: Self-pay | Admitting: Hematology

## 2020-06-12 VITALS — BP 137/95 | HR 92 | Temp 96.7°F | Resp 18 | Wt 231.6 lb

## 2020-06-12 DIAGNOSIS — I10 Essential (primary) hypertension: Secondary | ICD-10-CM | POA: Diagnosis not present

## 2020-06-12 DIAGNOSIS — C50911 Malignant neoplasm of unspecified site of right female breast: Secondary | ICD-10-CM | POA: Diagnosis not present

## 2020-06-12 DIAGNOSIS — R1310 Dysphagia, unspecified: Secondary | ICD-10-CM | POA: Insufficient documentation

## 2020-06-12 DIAGNOSIS — D0501 Lobular carcinoma in situ of right breast: Secondary | ICD-10-CM | POA: Diagnosis not present

## 2020-06-12 DIAGNOSIS — N951 Menopausal and female climacteric states: Secondary | ICD-10-CM | POA: Diagnosis not present

## 2020-06-12 MED ORDER — VENLAFAXINE HCL ER 75 MG PO CP24
75.0000 mg | ORAL_CAPSULE | Freq: Every day | ORAL | 0 refills | Status: DC
Start: 2020-06-12 — End: 2020-07-17
  Filled 2020-06-12: qty 30, 30d supply, fill #0

## 2020-06-12 MED ORDER — HYDROCHLOROTHIAZIDE 12.5 MG PO CAPS
12.5000 mg | ORAL_CAPSULE | Freq: Every day | ORAL | 3 refills | Status: DC
  Filled 2020-06-12: qty 30, 30d supply, fill #0
  Filled 2020-07-19: qty 90, 90d supply, fill #1

## 2020-06-12 NOTE — Patient Instructions (Signed)
[place] at [facility] Discharge Instructions  You were seen today by Dr. Delton Coombes. He went over your recent results. You will be scheduled to have a mammogram after November 29, 2020. You will be prescribed Effexor 75 mg to take daily for 2 weeks, then take 1 tablet every other day for 2 weeks, then stop. You will also be prescribed hydrochlorothiazide to take daily for your blood pressure; track your blood pressure at home daily. Make an appointment with your primary care for care of your blood pressure. Dr. Delton Coombes will see you back in 6 months for labs and follow up.   Thank you for choosing [place] at [facility] to provide your oncology and hematology care.  To afford each patient quality time with our provider, please arrive at least 15 minutes before your scheduled appointment time.   If you have a lab appointment with the [place] please come in thru the Main Entrance and check in at the main information desk  You need to re-schedule your appointment should you arrive 10 or more minutes late.  We strive to give you quality time with our providers, and arriving late affects you and other patients whose appointments are after yours.  Also, if you no show three or more times for appointments you may be dismissed from the clinic at the providers discretion.     Again, thank you for choosing [facility].  Our hope is that these requests will decrease the amount of time that you wait before being seen by our physicians.       _____________________________________________________________  Should you have questions after your visit to [facility], please contact our office at [phone] between the hours of 8:00 a.m. and 4:30 p.m.  Voicemails left after 4:00 p.m. will not be returned until the following business day.  For prescription refill requests, have your pharmacy contact our office and allow 72 hours.     Cancer Center Support Programs:   > Cancer Support Group  2nd Tuesday of the month 1pm-2pm, Journey Room

## 2020-06-12 NOTE — Progress Notes (Signed)
Pt is taking Tamoxifen as prescribed and is still having hot flashes.

## 2020-06-12 NOTE — Progress Notes (Signed)
[facility] [address] [place] [address]   Patient Care Team: Rory Percy, MD as PCP - General (Family Medicine)  SUMMARY OF ONCOLOGIC HISTORY: Oncology History   No history exists.    CHIEF COMPLIANT: Follow-up for right breast cancer   INTERVAL HISTORY: [name] is a 50 y.o. female here today for follow up of her right breast cancer. Her last visit was with Faythe Casa, NP, on 12/06/2019.   Today she reports feeling okay. She complains of having throbbing pain between the scapulae radiating down the back for the past 1 week lasting a few minutes; the pain resolves after resting for a few minutes. She also wants to cut back on Effexor with the goal of eventually stopping it. She also reports that her BP at home has been in the 130's/90's since starting Effexor which is above her usual. She is also taking tamoxifen. She also complains of having intermittent difficulty swallowing with regurgitation; her father has required esophageal stretching for similar issues.   REVIEW OF SYSTEMS:   Review of Systems  Constitutional: Positive for fatigue (75%). Negative for appetite change.  HENT:   Positive for trouble swallowing (sensation of food getting stuck).   Endocrine: Positive for hot flashes (improved w/ Effexor).  Musculoskeletal: Positive for myalgias (throbbing pain between shoulders w/ activity).  All other systems reviewed and are negative.   I have reviewed the past medical history, past surgical history, social history and family history with the patient and they are unchanged from previous note.   ALLERGIES:   is allergic to augmentin [amoxicillin-pot clavulanate].   MEDICATIONS:  Current Outpatient Medications  Medication Sig Dispense Refill  . hydrochlorothiazide (MICROZIDE) 12.5 MG capsule Take 1 capsule (12.5 mg total) by mouth daily. 30 capsule 3  . venlafaxine XR (EFFEXOR-XR) 75 MG 24 hr capsule Take 1 capsule by mouth daily for  2 weeks, then take one capsule every other day. 30 capsule 0  . benzonatate (TESSALON) 100 MG capsule Take 1 capsule (100 mg total) by mouth 2 (two) times daily as needed for cough. (Patient not taking: Reported on 06/12/2020) 20 capsule 0  . ibuprofen (ADVIL,MOTRIN) 200 MG tablet Take 200 mg by mouth every 6 (six) hours as needed. (Patient not taking: Reported on 04/17/2020)    . phenazopyridine (PYRIDIUM) 200 MG tablet Take 1 tablet (200 mg total) by mouth 3 (three) times daily as needed for pain. (Patient not taking: Reported on 06/12/2020) 10 tablet 0  . tamoxifen (NOLVADEX) 10 MG tablet Take 1 tablet (10 mg total) by mouth daily. 90 tablet 3   No current facility-administered medications for this visit.     PHYSICAL EXAMINATION: Performance status (ECOG): 0 - Asymptomatic  Vitals:   06/12/20 0759  BP: (!) 137/95  Pulse: 92  Resp: 18  Temp: (!) 96.7 F (35.9 C)  SpO2: 98%   Wt Readings from Last 3 Encounters:  06/12/20 231 lb 9.6 oz (105.1 kg)  04/17/20 234 lb (106.1 kg)  12/06/19 228 lb 9.9 oz (103.7 kg)   Physical Exam Vitals reviewed.  Constitutional:      Appearance: Normal appearance. She is obese.  Cardiovascular:     Rate and Rhythm: Normal rate and regular rhythm.     Pulses: Normal pulses.     Heart sounds: Normal heart sounds.  Pulmonary:     Effort: Pulmonary effort is normal.     Breath sounds: Normal breath sounds.  Chest:  Breasts:  Right: Normal. No swelling, bleeding, inverted nipple, mass, nipple discharge, skin change or tenderness.     Left: Normal. No swelling, bleeding, inverted nipple, mass, nipple discharge, skin change or tenderness.    Musculoskeletal:     Thoracic back: No tenderness or bony tenderness.  Neurological:     General: No focal deficit present.     Mental Status: She is alert and oriented to person, place, and time.  Psychiatric:        Mood and Affect: Mood normal.        Behavior: Behavior normal.     Breast Exam  Chaperone: Milinda Antis, MD     LABORATORY DATA:  I have reviewed the data as listed CMP Latest Ref Rng & Units 06/05/2020 11/30/2019 05/17/2019  Glucose 70 - 99 mg/dL 113(H) 112(H) 112(H)  BUN 6 - 20 mg/dL 14 17 13   Creatinine 0.44 - 1.00 mg/dL 0.93 0.93 0.98  Sodium 135 - 145 mmol/L 135 139 137  Potassium 3.5 - 5.1 mmol/L 4.7 5.3(H) 3.9  Chloride 98 - 111 mmol/L 103 104 106  CO2 22 - 32 mmol/L 23 26 23   Calcium 8.9 - 10.3 mg/dL 9.0 9.5 9.0  Total Protein 6.5 - 8.1 g/dL 7.3 7.3 7.0  Total Bilirubin 0.3 - 1.2 mg/dL 0.7 0.6 0.7  Alkaline Phos 38 - 126 U/L 59 45 49  AST 15 - 41 U/L 26 25 21   ALT 0 - 44 U/L 30 32 28   No results found for: [id number] Lab Results  Component Value Date   WBC 4.8 06/05/2020   HGB 14.9 06/05/2020   HCT 45.3 06/05/2020   MCV 96.0 06/05/2020   PLT 196 06/05/2020   NEUTROABS 2.5 06/05/2020    ASSESSMENT:  1.  Recurrent right breast LCIS: -Initially diagnosed with right breast LCIS in 2012, status post lumpectomy, ER/PR positive, did not take tamoxifen. -Recurrent right breast LCIS, status post lumpectomy on 10/08/2016. -Tamoxifen started in September 2018. -She reports that she has not been taking tamoxifen on a regular basis as she forgets.  However she is doing much better lately. -She is experiencing 1-2 hot flashes during the daytime.  No heart flashes at nighttime. -She had a Pap smear beginning of April which was negative.  She does not report any vaginal bleeding. -Last mammogram on 11/10/2018 was BI-RADS Category 2. -Given the high density of her breast, I have recommended periodic MRIs.  We will order a breast MRI and follow-up with a phone visit.    PLAN:  1.  Recurrent right breast LCIS: - She is tolerating tamoxifen 10 mg daily very well. - We have reviewed labs from 06/05/2020 which showed normal LFTs and CBC.  Creatinine was also normal. - Last mammogram on 11/30/2019 was BI-RADS Category 2. - MRI of the breast on 06/04/2019 was BI-RADS  Category 1. - Recommend follow-up in 6 months.  2.  Hypertension: -Blood pressure today is 137/95.  Diastolic blood pressure is elevated last 2 occasions.  She also reported high blood pressures when she checked at home. -She also reported pain in between shoulder blades when she ran up the stairs today to get to her clinic.  Pain subsided within few minutes after sitting. - If the pain recurs, I have told her to follow-up with her PMD. - I have recommended starting her on HCTZ 12.5 mg and titrate up as needed. - We will check her potassium level in 3 weeks.  3.  Hot flashes: - She is currently  on Effexor 150 mg daily.  She wants to be weaned off of Effexor because of weight concerns. - I have sent a prescription for Effexor 75 mg daily.  She will take it for 2 weeks followed by every other day until she finishes the bottle.  Breast Cancer therapy associated bone loss: I have recommended calcium, Vitamin D and weight bearing exercises.  Orders placed this encounter:  Orders Placed This Encounter  Procedures  . MM DIAG BREAST TOMO BILATERAL  . CBC with Differential/Platelet  . Comprehensive metabolic panel  . VITAMIN D 25 Hydroxy (Vit-D Deficiency, Fractures)  . Potassium    The patient has a good understanding of the overall plan. She agrees with it. She will call with any problems that may develop before the next visit here.  Derek Jack, MD [place] [phone]   I, Milinda Antis, am acting as a scribe for Dr. Sanda Linger.  I, Derek Jack MD, have reviewed the above documentation for accuracy and completeness, and I agree with the above.

## 2020-06-26 ENCOUNTER — Inpatient Hospital Stay (HOSPITAL_COMMUNITY): Payer: 59

## 2020-07-17 ENCOUNTER — Other Ambulatory Visit (HOSPITAL_COMMUNITY): Payer: Self-pay

## 2020-07-17 ENCOUNTER — Other Ambulatory Visit (HOSPITAL_COMMUNITY): Payer: Self-pay | Admitting: *Deleted

## 2020-07-17 ENCOUNTER — Other Ambulatory Visit (HOSPITAL_BASED_OUTPATIENT_CLINIC_OR_DEPARTMENT_OTHER): Payer: Self-pay

## 2020-07-17 DIAGNOSIS — I10 Essential (primary) hypertension: Secondary | ICD-10-CM

## 2020-07-17 MED ORDER — VENLAFAXINE HCL ER 75 MG PO CP24
75.0000 mg | ORAL_CAPSULE | Freq: Every day | ORAL | 0 refills | Status: DC
  Filled 2020-07-17: qty 30, 46d supply, fill #0

## 2020-07-19 ENCOUNTER — Other Ambulatory Visit (HOSPITAL_COMMUNITY): Payer: Self-pay

## 2020-07-31 ENCOUNTER — Ambulatory Visit: Payer: 59 | Admitting: Internal Medicine

## 2020-08-10 ENCOUNTER — Ambulatory Visit: Payer: 59 | Admitting: Internal Medicine

## 2020-08-24 ENCOUNTER — Other Ambulatory Visit (HOSPITAL_COMMUNITY): Payer: Self-pay | Admitting: Hematology

## 2020-08-24 ENCOUNTER — Other Ambulatory Visit (HOSPITAL_COMMUNITY): Payer: Self-pay

## 2020-08-24 MED ORDER — VENLAFAXINE HCL ER 75 MG PO CP24
75.0000 mg | ORAL_CAPSULE | Freq: Every day | ORAL | 3 refills | Status: DC
Start: 2020-08-24 — End: 2021-02-06
  Filled 2020-08-24: qty 30, 46d supply, fill #0
  Filled 2020-10-30: qty 30, 60d supply, fill #1

## 2020-08-31 ENCOUNTER — Encounter: Payer: Self-pay | Admitting: Orthopaedic Surgery

## 2020-08-31 ENCOUNTER — Ambulatory Visit: Payer: 59

## 2020-08-31 ENCOUNTER — Ambulatory Visit (INDEPENDENT_AMBULATORY_CARE_PROVIDER_SITE_OTHER): Payer: 59 | Admitting: Orthopaedic Surgery

## 2020-08-31 ENCOUNTER — Other Ambulatory Visit: Payer: Self-pay

## 2020-08-31 ENCOUNTER — Ambulatory Visit: Payer: 59 | Admitting: Internal Medicine

## 2020-08-31 VITALS — BP 147/92 | HR 81 | Ht 67.0 in | Wt 238.0 lb

## 2020-08-31 DIAGNOSIS — G8929 Other chronic pain: Secondary | ICD-10-CM

## 2020-08-31 DIAGNOSIS — M25561 Pain in right knee: Secondary | ICD-10-CM

## 2020-08-31 NOTE — Progress Notes (Signed)
Subjective:    Patient ID: [name], female    DOB: [birth year], 50 y.o.   MRN: [id number]  HPI She has had pain of the right knee getting worse over the last month to six weeks. She has medial pain, swelling, popping and more recently giving way.  She has no redness, no trauma.  She has tried ibuprofen which helps and ice.  She has no other joint pain.  She is not improving.     Review of Systems  Constitutional:  Positive for activity change.  Musculoskeletal:  Positive for arthralgias, gait problem and joint swelling.  All other systems reviewed and are negative. For Review of Systems, all other systems reviewed and are negative.  The following is a summary of the past history medically, past history surgically, known current medicines, social history and family history.  This information is gathered electronically by the computer from prior information and documentation.  I review this each visit and have found including this information at this point in the chart is beneficial and informative.   Past Medical History:  Diagnosis Date   Breast cancer ([place]) [phone]   rt breast   Breast disorder    cancer right breast   Cancer ([place])    right lobular carcinoma in situ   Gastroparesis    controlled    Past Surgical History:  Procedure Laterality Date   BACK SURGERY  10/2002   lumbar hemilaminectomy, microdiscectomy L5-S1   BREAST BIOPSY Right 07/2016   malignant; waiting on MRI results   BREAST LUMPECTOMY Right 2012,2018   BREAST LUMPECTOMY WITH RADIOACTIVE SEED LOCALIZATION Right 10/08/2016   Procedure: RIGHT BREAST LUMPECTOMY WITH RADIOACTIVE SEED LOCALIZATION;  Surgeon: Fanny Skates, MD;  Location: [facility];  Service: General;  Laterality: Right;   BREAST SURGERY     TUBAL LIGATION  2001    Current Outpatient Medications on File Prior to Visit  Medication Sig Dispense Refill   hydrochlorothiazide (MICROZIDE) 12.5 MG capsule Take 1 capsule (12.5 mg  total) by mouth daily. 30 capsule 3   ibuprofen (ADVIL,MOTRIN) 200 MG tablet Take 200 mg by mouth every 6 (six) hours as needed.     tamoxifen (NOLVADEX) 10 MG tablet Take 1 tablet (10 mg total) by mouth daily. 90 tablet 3   venlafaxine XR (EFFEXOR-XR) 75 MG 24 hr capsule Take 1 capsule by mouth daily with breakast for 2 weeks, then take one capsule every other day. 30 capsule 3   No current facility-administered medications on file prior to visit.    Social History   Socioeconomic History   Marital status: Married    Spouse name: Not on file   Number of children: Not on file   Years of education: Not on file   Highest education level: Not on file  Occupational History   Not on file  Tobacco Use   Smoking status: Former    Packs/day: 1.00    Years: 16.00    Pack years: 16.00    Types: Cigarettes    Quit date: 08/12/2000    Years since quitting: 20.0   Smokeless tobacco: Never   Tobacco comments:    quit 10 yrs  Vaping Use   Vaping Use: Never used  Substance and Sexual Activity   Alcohol use: Yes    Comment: occasionally   Drug use: No   Sexual activity: Yes    Birth control/protection: Surgical    Comment: tubal  Other Topics Concern   Not on  file  Social History Narrative   Not on file   Social Determinants of Health   Financial Resource Strain: Not on file  Food Insecurity: Not on file  Transportation Needs: Not on file  Physical Activity: Not on file  Stress: Not on file  Social Connections: Not on file  Intimate Partner Violence: Not on file    Family History  Problem Relation Age of Onset   Hypertension Mother    Hyperlipidemia Mother    Diverticulitis Mother    Hernia Mother    Heart disease Father        arrythmia   Hypertension Father    Cancer Maternal Uncle        pancreatic   Congestive Heart Failure Paternal Grandmother    Heart attack Maternal Grandmother    Aneurysm Maternal Grandfather    Arthritis Brother     BP (!) 147/92   Pulse  81   Ht 5\' 7"  (1.702 m)   Wt 238 lb (108 kg)   BMI 37.28 kg/m   Body mass index is 37.28 kg/m.     Objective:   Physical Exam Vitals and nursing note reviewed. Exam conducted with a chaperone present.  Constitutional:      Appearance: She is well-developed.  HENT:     Head: Normocephalic and atraumatic.  Eyes:     Conjunctiva/sclera: Conjunctivae normal.     Pupils: Pupils are equal, round, and reactive to light.  Cardiovascular:     Rate and Rhythm: Normal rate and regular rhythm.  Pulmonary:     Effort: Pulmonary effort is normal.  Abdominal:     Palpations: Abdomen is soft.  Musculoskeletal:     Cervical back: Normal range of motion and neck supple.       Legs:  Skin:    General: Skin is warm and dry.  Neurological:     Mental Status: She is alert and oriented to person, place, and time.     Cranial Nerves: No cranial nerve deficit.     Motor: No abnormal muscle tone.     Coordination: Coordination normal.     Deep Tendon Reflexes: Reflexes are normal and symmetric. Reflexes normal.  Psychiatric:        Behavior: Behavior normal.        Thought Content: Thought content normal.        Judgment: Judgment normal.  X-rays were done of the right knee, reported separately.        Assessment & Plan:   Encounter Diagnosis  Name Primary?   Chronic pain of right knee Yes   I am concerned about medial meniscus tear.  I will get MRI.  PROCEDURE NOTE:  The patient requests injections of the right knee , verbal consent was obtained.  The right knee was prepped appropriately after time out was performed.   Sterile technique was observed and injection of 1 cc of Celestone 6mg  with several cc's of plain xylocaine. Anesthesia was provided by ethyl chloride and a 20-gauge needle was used to inject the knee area. The injection was tolerated well.  A band aid dressing was applied.  The patient was advised to apply ice later today and tomorrow to the injection sight as  needed.   Return in one month, earlier if she can get MRI done earlier.  I have discussed possible arthroscopy.  Call if any problem.  Precautions discussed.  Electronically Signed Sanjuana Kava, MD 7/21/202211:45 AM

## 2020-09-05 ENCOUNTER — Ambulatory Visit: Payer: 59 | Admitting: Internal Medicine

## 2020-09-08 ENCOUNTER — Ambulatory Visit (INDEPENDENT_AMBULATORY_CARE_PROVIDER_SITE_OTHER): Payer: 59 | Admitting: Adult Health

## 2020-09-08 ENCOUNTER — Other Ambulatory Visit: Payer: Self-pay

## 2020-09-08 ENCOUNTER — Encounter: Payer: Self-pay | Admitting: Adult Health

## 2020-09-08 VITALS — BP 125/89 | HR 75 | Ht 66.5 in | Wt 234.0 lb

## 2020-09-08 DIAGNOSIS — N951 Menopausal and female climacteric states: Secondary | ICD-10-CM | POA: Diagnosis not present

## 2020-09-08 DIAGNOSIS — Z1211 Encounter for screening for malignant neoplasm of colon: Secondary | ICD-10-CM

## 2020-09-08 DIAGNOSIS — Z853 Personal history of malignant neoplasm of breast: Secondary | ICD-10-CM

## 2020-09-08 DIAGNOSIS — Z01419 Encounter for gynecological examination (general) (routine) without abnormal findings: Secondary | ICD-10-CM | POA: Diagnosis not present

## 2020-09-08 LAB — HEMOCCULT GUIAC POC 1CARD (OFFICE): Fecal Occult Blood, POC: NEGATIVE

## 2020-09-08 NOTE — Progress Notes (Signed)
Patient ID: [name], female   DOB: Dec 27, 1970, 50 y.o.   MRN: [id number] History of Present Illness: [name] is a 50 year old white female, married, G2P0202, in for a well woman gyn exam.  Lab Results  Component Value Date   DIAGPAP  05/17/2019    - Negative for intraepithelial lesion or malignancy (NILM)   HPV NOT DETECTED 09/02/2016   [place] Negative 05/17/2019   PCP is Dr Posey Pronto.   Current Medications, Allergies, Past Medical History, Past Surgical History, Family History and Social History were reviewed in [employer] record.     Review of Systems:  Patient denies any headaches, hearing loss, fatigue, blurred vision, shortness of breath, chest pain, abdominal pain, problems with bowel movements, urination, or intercourse. No joint pain or mood swings.  +hot flashes +irregular periods    Physical Exam:BP 125/89 (BP Location: Left Arm, Patient Position: Sitting, Cuff Size: Large)   Pulse 75   Ht 5' 6.5" (1.689 m)   Wt 234 lb (106.1 kg)   BMI 37.20 kg/m   General:  Well developed, well nourished, no acute distress Skin:  Warm and dry Neck:  Midline trachea, normal thyroid, good ROM, no lymphadenopathy Lungs; Clear to auscultation bilaterally Breast:  No dominant palpable mass, retraction, or nipple discharge Cardiovascular: Regular rate and rhythm Abdomen:  Soft, non tender, no hepatosplenomegaly Pelvic:  External genitalia is normal in appearance, no lesions.  The vagina is normal in appearance. Urethra has no lesions or masses. The cervix is bulbous.  Uterus is felt to be normal size, shape, and contour.  No adnexal masses or tenderness noted.Bladder is non tender, no masses felt. Rectal: Good sphincter tone, no polyps, or hemorrhoids felt.  Hemoccult negative. Extremities/musculoskeletal:  No swelling or varicosities noted, no clubbing or cyanosis Psych:  No mood changes, alert and cooperative,seems happy AA is 8 Fall risk is low Depression screen [facility]  2/9 09/08/2020 07/15/2019 05/17/2019  Decreased Interest 0 0 0  Down, Depressed, Hopeless 0 0 0  PHQ - 2 Score 0 0 0  Altered sleeping '1 2 3  '$ Tired, decreased energy '1 1 3  '$ Change in appetite '1 1 2  '$ Feeling bad or failure about yourself  0 0 1  Trouble concentrating 0 1 2  Moving slowly or fidgety/restless 1 0 1  Suicidal thoughts 0 0 0  PHQ-9 Score '4 5 12  '$ Difficult doing work/chores - - Not difficult at all   On Effexor  GAD 7 : Generalized Anxiety Score 09/08/2020 07/15/2019 05/17/2019  Nervous, Anxious, on Edge 1 0 2  Control/stop worrying 0 0 1  Worry too much - different things 0 0 2  Trouble relaxing 1 0 1  Restless 1 0 0  Easily annoyed or irritable 0 0 1  Afraid - awful might happen 1 0 2  Total GAD 7 Score 4 0 9  Anxiety Difficulty - - Somewhat difficult      Upstream - 09/08/20 1125       Pregnancy Intention Screening   Does the patient want to become pregnant in the next year? No    Does the patient's partner want to become pregnant in the next year? No    Would the patient like to discuss contraceptive options today? No      Contraception Wrap Up   Current Method Female Sterilization    End Method Female Sterilization    Contraception Counseling Provided No  Examination chaperoned by Levy Pupa LPN   Impression and Plan: 1. Encounter for well woman exam with routine gynecological exam Physical in 1 year Pap 2024 Mammogram yearly Labs with PCP Colonoscopy advised   2. Encounter for screening fecal occult blood testing   3. History of right breast cancer Mammogram yearly  4. Peri-menopause Discussed symptoms, not candidate for HRT due to breast cancer Review handout on peri menopause  Effexor will help some with hot flashes and moods

## 2020-09-08 NOTE — Patient Instructions (Signed)
Thank you for choosing our office today! We appreciate the opportunity to meet your healthcare needs. You may receive a short survey by e-mail or through MyChart. If you are happy with your care we would appreciate if you could take just a few minutes to complete the survey questions. We read all of your comments and take your feedback very seriously. Thank you again for choosing our office.  [name]   [facility] Team  

## 2020-09-13 ENCOUNTER — Other Ambulatory Visit (HOSPITAL_COMMUNITY): Payer: Self-pay

## 2020-09-25 ENCOUNTER — Ambulatory Visit (HOSPITAL_COMMUNITY): Payer: 59

## 2020-09-26 ENCOUNTER — Ambulatory Visit: Payer: 59 | Admitting: Orthopaedic Surgery

## 2020-10-06 ENCOUNTER — Ambulatory Visit: Payer: 59 | Admitting: Internal Medicine

## 2020-10-26 ENCOUNTER — Ambulatory Visit (INDEPENDENT_AMBULATORY_CARE_PROVIDER_SITE_OTHER): Payer: 59 | Admitting: Internal Medicine

## 2020-10-26 ENCOUNTER — Other Ambulatory Visit: Payer: Self-pay

## 2020-10-26 ENCOUNTER — Other Ambulatory Visit (HOSPITAL_COMMUNITY): Payer: Self-pay

## 2020-10-26 ENCOUNTER — Encounter: Payer: Self-pay | Admitting: Internal Medicine

## 2020-10-26 VITALS — BP 136/94 | HR 81 | Resp 18 | Ht 67.0 in | Wt 239.1 lb

## 2020-10-26 DIAGNOSIS — N951 Menopausal and female climacteric states: Secondary | ICD-10-CM

## 2020-10-26 DIAGNOSIS — R7303 Prediabetes: Secondary | ICD-10-CM | POA: Insufficient documentation

## 2020-10-26 DIAGNOSIS — F419 Anxiety disorder, unspecified: Secondary | ICD-10-CM

## 2020-10-26 DIAGNOSIS — Z7689 Persons encountering health services in other specified circumstances: Secondary | ICD-10-CM

## 2020-10-26 DIAGNOSIS — E782 Mixed hyperlipidemia: Secondary | ICD-10-CM

## 2020-10-26 DIAGNOSIS — I10 Essential (primary) hypertension: Secondary | ICD-10-CM

## 2020-10-26 DIAGNOSIS — Z853 Personal history of malignant neoplasm of breast: Secondary | ICD-10-CM

## 2020-10-26 DIAGNOSIS — Z114 Encounter for screening for human immunodeficiency virus [HIV]: Secondary | ICD-10-CM

## 2020-10-26 DIAGNOSIS — Z1159 Encounter for screening for other viral diseases: Secondary | ICD-10-CM

## 2020-10-26 DIAGNOSIS — E559 Vitamin D deficiency, unspecified: Secondary | ICD-10-CM

## 2020-10-26 DIAGNOSIS — R739 Hyperglycemia, unspecified: Secondary | ICD-10-CM | POA: Diagnosis not present

## 2020-10-26 MED ORDER — LISINOPRIL-HYDROCHLOROTHIAZIDE 10-12.5 MG PO TABS
1.0000 | ORAL_TABLET | Freq: Every day | ORAL | 1 refills | Status: DC
  Filled 2020-10-26: qty 90, 90d supply, fill #0

## 2020-10-26 NOTE — Assessment & Plan Note (Signed)
Was on Effexor, has been trying to taper it down and have stopped it now Wants to manage it without medications

## 2020-10-26 NOTE — Assessment & Plan Note (Signed)
Care established History and medications reviewed with the patient 

## 2020-10-26 NOTE — Assessment & Plan Note (Addendum)
S/p lobectomy ER/PR positive Was on tamoxifen Followed by Oncology 

## 2020-10-26 NOTE — Progress Notes (Signed)
New Patient Office Visit  Subjective:  Patient ID: [name], female    DOB: Jan 11, 1971  Age: 50 y.o. MRN: [id number]  CC:  Chief Complaint  Patient presents with   New Patient (Initial Visit)    [facility] has not had pcp in awhile has been weaning herself off of effexor makes her feel terrible and bp has been running high also doesn't know if she is going through menopause as she sweats all the time     HPI [name] is a 50 year old female with PMH of HTN, recurrent breast cancer (ER/PR positive) s/p lumpectomy, anxiety, hot flashes and obesity who presents for establishing care.  HTN: Her BP was elevated in the office today.  She has been taking HCTZ regularly.  She denies any headache, dizziness, chest pain, dyspnea or palpitations.  History of ER/PR breast cancer: She has had lumpectomies in the past.  She was taking tamoxifen, but has stopped it due to hot flashes.  She is followed by Oncology.  She was taking Effexor for anxiety and hot flashes, but she has stopped taking it now as it was not helping much.  She prefers to manage her anxiety without meds for now.  She denies any anhedonia, SI or HI.  She has had 1 dose of J&J COVID vaccine.  Past Medical History:  Diagnosis Date   Breast cancer ([place]) [phone]   rt breast   Breast disorder    cancer right breast   Cancer ([place])    right lobular carcinoma in situ   Gastroparesis    controlled    Past Surgical History:  Procedure Laterality Date   BACK SURGERY  10/2002   lumbar hemilaminectomy, microdiscectomy L5-S1   BREAST BIOPSY Right 07/2016   malignant; waiting on MRI results   BREAST LUMPECTOMY Right 2012,2018   BREAST LUMPECTOMY WITH RADIOACTIVE SEED LOCALIZATION Right 10/08/2016   Procedure: RIGHT BREAST LUMPECTOMY WITH RADIOACTIVE SEED LOCALIZATION;  Surgeon: Fanny Skates, MD;  Location: [facility] [place];  Service: General;  Laterality: Right;   BREAST  SURGERY     TUBAL LIGATION  2001    Family History  Problem Relation Age of Onset   Hypertension Mother    Hyperlipidemia Mother    Diverticulitis Mother    Hernia Mother    Heart disease Father        arrythmia   Hypertension Father    Cancer Maternal Uncle        pancreatic   Congestive Heart Failure Paternal Grandmother    Heart attack Maternal Grandmother    Aneurysm Maternal Grandfather    Arthritis Brother     Social History   Socioeconomic History   Marital status: Married    Spouse name: Not on file   Number of children: Not on file   Years of education: Not on file   Highest education level: Not on file  Occupational History   Not on file  Tobacco Use   Smoking status: Former    Packs/day: 1.00    Years: 16.00    Pack years: 16.00    Types: Cigarettes    Quit date: 08/12/2000    Years since quitting: 20.2   Smokeless tobacco: Never   Tobacco comments:    quit 10 yrs  Vaping Use   Vaping Use: Never used  Substance and Sexual Activity   Alcohol use: Yes    Comment: occasionally   Drug use: No  Sexual activity: Yes    Birth control/protection: Surgical    Comment: tubal  Other Topics Concern   Not on file  Social History Narrative   Not on file   Social Determinants of Health   Financial Resource Strain: Low Risk    Difficulty of Paying Living Expenses: Not hard at all  Food Insecurity: No Food Insecurity   Worried About Charity fundraiser in the Last Year: Never true   [place] in the Last Year: Never true  Transportation Needs: No Transportation Needs   Lack of Transportation (Medical): No   Lack of Transportation (Non-Medical): No  Physical Activity: Insufficiently Active   Days of Exercise per Week: 1 day   Minutes of Exercise per Session: 30 min  Stress: No Stress Concern Present   Feeling of Stress : Only a little  Social Connections: Moderately Integrated   Frequency of Communication with Friends and Family: More than three  times a week   Frequency of Social Gatherings with Friends and Family: Three times a week   Attends Religious Services: Never   Active Member of Clubs or Organizations: Yes   Attends Music therapist: More than 4 times per year   Marital Status: Married  Human resources officer Violence: Not At Risk   Fear of Current or Ex-Partner: No   Emotionally Abused: No   Physically Abused: No   Sexually Abused: No    ROS Review of Systems  Constitutional:  Negative for chills and fever.  HENT:  Negative for congestion, sinus pressure, sinus pain and sore throat.   Eyes:  Negative for pain and discharge.  Respiratory:  Negative for cough and shortness of breath.   Cardiovascular:  Negative for chest pain and palpitations.  Gastrointestinal:  Negative for abdominal pain, constipation, diarrhea, nausea and vomiting.  Endocrine: Negative for polydipsia and polyuria.  Genitourinary:  Negative for dysuria and hematuria.  Musculoskeletal:  Negative for neck pain and neck stiffness.  Skin:  Negative for rash.  Neurological:  Negative for dizziness and weakness.  Psychiatric/Behavioral:  Negative for agitation and behavioral problems.    Objective:   Today's Vitals: BP (!) 136/94 (BP Location: Left Arm, Patient Position: Sitting, Cuff Size: Normal)   Pulse 81   Resp 18   Ht '5\' 7"'  (1.702 m)   Wt 239 lb 1.9 oz (108.5 kg)   SpO2 98%   BMI 37.45 kg/m   Physical Exam Vitals reviewed.  Constitutional:      General: She is not in acute distress.    Appearance: She is not diaphoretic.  HENT:     Head: Normocephalic and atraumatic.     Nose: Nose normal.     Mouth/Throat:     Mouth: Mucous membranes are moist.  Eyes:     General: No scleral icterus.    Extraocular Movements: Extraocular movements intact.  Cardiovascular:     Rate and Rhythm: Normal rate and regular rhythm.     Pulses: Normal pulses.     Heart sounds: Normal heart sounds. No murmur heard. Pulmonary:     Breath  sounds: Normal breath sounds. No wheezing or rales.  Musculoskeletal:     Cervical back: Neck supple. No tenderness.     Right lower leg: No edema.     Left lower leg: No edema.  Skin:    General: Skin is warm.     Findings: No rash.  Neurological:     General: No focal deficit present.  Mental Status: She is alert and oriented to person, place, and time.  Psychiatric:        Mood and Affect: Mood normal.        Behavior: Behavior normal.    Assessment & Plan:   Problem List Items Addressed This Visit       Encounter to establish care - Primary   Care established History and medications reviewed with the patient     Relevant Orders  CBC with Differential/Platelet    Cardiovascular and Mediastinum   Hypertension    BP Readings from Last 1 Encounters:  10/26/20 (!) 136/94  Uncontrolled with HCTZ Started lisinopril-HCTZ 10- 12.5 mg daily Counseled for compliance with the medications Advised DASH diet and moderate exercise/walking, at least 150 mins/week       Relevant Medications   lisinopril-hydrochlorothiazide (ZESTORETIC) 10-12.5 MG tablet   Other Relevant Orders   CBC with Differential/Platelet   CMP14+EGFR   TSH     Other   History of right breast cancer    S/p lobectomy ER/PR positive Was on tamoxifen Followed by Oncology      Anxiety    Was on Effexor, has been trying to taper it down and have stopped it now Wants to manage it without medications      Peri-menopause    Hot flashes likely due to perimenopause Was on Effexor, stopped taking it now Not a candidate for HRT due to history of ER/PR positive breast cancer      Hyperglycemia    Reviewed last BMP Check HbA1c      Relevant Orders   Hemoglobin A1c   Other Visit Diagnoses     Mixed hyperlipidemia       Relevant Medications   lisinopril-hydrochlorothiazide (ZESTORETIC) 10-12.5 MG tablet   Other Relevant Orders   Lipid panel   Vitamin D deficiency       Relevant Orders    Vitamin D (25 hydroxy)   Need for hepatitis C screening test       Relevant Orders   Hepatitis C Antibody   Encounter for screening for HIV       Relevant Orders   HIV antibody (with reflex)       Outpatient Encounter Medications as of 10/26/2020  Medication Sig   ibuprofen (ADVIL,MOTRIN) 200 MG tablet Take 200 mg by mouth every 6 (six) hours as needed.   lisinopril-hydrochlorothiazide (ZESTORETIC) 10-12.5 MG tablet Take 1 tablet by mouth daily.   [DISCONTINUED] hydrochlorothiazide (MICROZIDE) 12.5 MG capsule Take 1 capsule (12.5 mg total) by mouth daily.   venlafaxine XR (EFFEXOR-XR) 75 MG 24 hr capsule Take 1 capsule by mouth daily with breakast for 2 weeks, then take one capsule every other day. (Patient not taking: Reported on 10/26/2020)   No facility-administered encounter medications on file as of 10/26/2020.    Follow-up: Return in about 4 months (around 02/25/2021) for HTN.   Lindell Spar, MD

## 2020-10-26 NOTE — Assessment & Plan Note (Signed)
Hot flashes likely due to perimenopause Was on Effexor, stopped taking it now Not a candidate for HRT due to history of ER/PR positive breast cancer

## 2020-10-26 NOTE — Patient Instructions (Signed)
Please start taking Lisinopril-HCTZ instead of HCTZ.  Please follow DASH diet and perform moderate exercise/walking at least 150 mins/week.  Please get fasting blood tests after 2 weeks.

## 2020-10-26 NOTE — Assessment & Plan Note (Signed)
Reviewed last BMP Check HbA1c

## 2020-10-26 NOTE — Assessment & Plan Note (Signed)
BP Readings from Last 1 Encounters:  10/26/20 (!) 136/94   Uncontrolled with HCTZ Started lisinopril-HCTZ 10- 12.5 mg daily Counseled for compliance with the medications Advised DASH diet and moderate exercise/walking, at least 150 mins/week

## 2020-10-30 ENCOUNTER — Other Ambulatory Visit (HOSPITAL_COMMUNITY): Payer: Self-pay

## 2020-12-05 ENCOUNTER — Inpatient Hospital Stay (HOSPITAL_COMMUNITY): Payer: 59

## 2020-12-13 ENCOUNTER — Inpatient Hospital Stay (HOSPITAL_COMMUNITY): Payer: 59 | Attending: Hematology

## 2020-12-13 ENCOUNTER — Ambulatory Visit (HOSPITAL_COMMUNITY): Payer: 59 | Admitting: Hematology

## 2020-12-15 ENCOUNTER — Other Ambulatory Visit (HOSPITAL_COMMUNITY): Payer: Self-pay | Admitting: Hematology

## 2020-12-15 DIAGNOSIS — C50911 Malignant neoplasm of unspecified site of right female breast: Secondary | ICD-10-CM

## 2020-12-20 ENCOUNTER — Ambulatory Visit (HOSPITAL_COMMUNITY): Payer: 59 | Admitting: Hematology

## 2021-01-08 ENCOUNTER — Telehealth: Payer: 59 | Admitting: Family Medicine

## 2021-01-08 ENCOUNTER — Other Ambulatory Visit (HOSPITAL_COMMUNITY): Payer: Self-pay

## 2021-01-08 DIAGNOSIS — R6889 Other general symptoms and signs: Secondary | ICD-10-CM

## 2021-01-08 MED ORDER — BENZONATATE 100 MG PO CAPS
100.0000 mg | ORAL_CAPSULE | Freq: Two times a day (BID) | ORAL | 0 refills | Status: DC | PRN
  Filled 2021-01-08: qty 20, 10d supply, fill #0

## 2021-01-08 MED ORDER — OSELTAMIVIR PHOSPHATE 75 MG PO CAPS
75.0000 mg | ORAL_CAPSULE | Freq: Two times a day (BID) | ORAL | 0 refills | Status: AC
  Filled 2021-01-08: qty 10, 5d supply, fill #0

## 2021-01-08 NOTE — Progress Notes (Signed)
E visit for Flu like symptoms   We are sorry that you are not feeling well.  Here is how we plan to help! Based on what you have shared with me it looks like you may have a respiratory virus that may be influenza.  Influenza or "the flu" is   an infection caused by a respiratory virus. The flu virus is highly contagious and persons who did not receive their yearly flu vaccination may "catch" the flu from close contact.  We have anti-viral medications to treat the viruses that cause this infection. They are not a "cure" and only shorten the course of the infection. These prescriptions are most effective when they are given within the first 2 days of "flu" symptoms. Antiviral medication are indicated if you have a high risk of complications from the flu. You should  also consider an antiviral medication if you are in close contact with someone who is at risk. These medications can help patients avoid complications from the flu  but have side effects that you should know. Possible side effects from Tamiflu or oseltamivir include nausea, vomiting, diarrhea, dizziness, headaches, eye redness, sleep problems or other respiratory symptoms. You should not take Tamiflu if you have an allergy to oseltamivir or any to the ingredients in Tamiflu.  Based upon your symptoms and potential risk factors I have prescribed Oseltamivir (Tamiflu).  It has been sent to your designated pharmacy.  You will take one 75 mg capsule orally twice a day for the next 5 days.  I also ordered Tessalon Perles to help cough  ANYONE WHO HAS FLU SYMPTOMS SHOULD: Stay home. The flu is highly contagious and going out or to work exposes others! Be sure to drink plenty of fluids. Water is fine as well as fruit juices, sodas and electrolyte beverages. You may want to stay away from caffeine or alcohol. If you are nauseated, try taking small sips of liquids. How do you know if you are getting enough fluid? Your urine should be a pale yellow  or almost colorless. Get rest. Taking a steamy shower or using a humidifier may help nasal congestion and ease sore throat pain. Using a saline nasal spray works much the same way. Cough drops, hard candies and sore throat lozenges may ease your cough. Line up a caregiver. Have someone check on you regularly.   GET HELP RIGHT AWAY IF: You cannot keep down liquids or your medications. You become short of breath Your fell like you are going to pass out or loose consciousness. Your symptoms persist after you have completed your treatment plan MAKE SURE YOU  Understand these instructions. Will watch your condition. Will get help right away if you are not doing well or get worse.  Your e-visit answers were reviewed by a board certified advanced clinical practitioner to complete your personal care plan.  Depending on the condition, your plan could have included both over the counter or prescription medications.  If there is a problem please reply  once you have received a response from your provider.  Your safety is important to [place].  If you have drug allergies check your prescription carefully.    You can use MyChart to ask questions about today's visit, request a non-urgent call back, or ask for a work or school excuse for 24 hours related to this e-Visit. If it has been greater than 24 hours you will need to follow up with your provider, or enter a new e-Visit to address those concerns.  You will get an e-mail in the next two days asking about your experience.  I hope that your e-visit has been valuable and will speed your recovery. Thank you for using e-visits.  I provided 5 minutes of non face-to-face time during this encounter for chart review, medication and order placement, as well as and documentation.

## 2021-01-12 ENCOUNTER — Telehealth: Payer: 59 | Admitting: Physician Assistant

## 2021-01-12 ENCOUNTER — Encounter: Payer: Self-pay | Admitting: Internal Medicine

## 2021-01-12 DIAGNOSIS — B9689 Other specified bacterial agents as the cause of diseases classified elsewhere: Secondary | ICD-10-CM

## 2021-01-12 DIAGNOSIS — J208 Acute bronchitis due to other specified organisms: Secondary | ICD-10-CM

## 2021-01-12 MED ORDER — PREDNISONE 10 MG (21) PO TBPK: ORAL_TABLET | ORAL | 0 refills | Status: DC

## 2021-01-12 MED ORDER — AZITHROMYCIN 250 MG PO TABS: ORAL_TABLET | ORAL | 0 refills | Status: AC

## 2021-01-12 NOTE — Progress Notes (Signed)
We are sorry that you are not feeling well.  Here is how we plan to help!  Based on your presentation I believe you most likely have A cough due to bacteria.  When patients have a fever and a productive cough with a change in color or increased sputum production, we are concerned about bacterial bronchitis.  If left untreated it can progress to pneumonia.  If your symptoms do not improve with your treatment plan it is important that you contact your provider.   I have prescribed Azithromyin 250 mg: two tablets now and then one tablet daily for 4 additonal days    Prednisone 10 mg daily for 6 days (see taper instructions below)  Directions for 6 day taper: Day 1: 2 tablets before breakfast, 1 after both lunch & dinner and 2 at bedtime Day 2: 1 tab before breakfast, 1 after both lunch & dinner and 2 at bedtime Day 3: 1 tab at each meal & 1 at bedtime Day 4: 1 tab at breakfast, 1 at lunch, 1 at bedtime Day 5: 1 tab at breakfast & 1 tab at bedtime Day 6: 1 tab at breakfast  From your responses in the eVisit questionnaire you describe inflammation in the upper respiratory tract which is causing a significant cough.  This is commonly called Bronchitis and has four common causes:   Allergies Viral Infections Acid Reflux Bacterial Infection Allergies, viruses and acid reflux are treated by controlling symptoms or eliminating the cause. An example might be a cough caused by taking certain blood pressure medications. You stop the cough by changing the medication. Another example might be a cough caused by acid reflux. Controlling the reflux helps control the cough.  USE OF BRONCHODILATOR ("RESCUE") INHALERS: There is a risk from using your bronchodilator too frequently.  The risk is that over-reliance on a medication which only relaxes the muscles surrounding the breathing tubes can reduce the effectiveness of medications prescribed to reduce swelling and congestion of the tubes themselves.  Although  you feel brief relief from the bronchodilator inhaler, your asthma may actually be worsening with the tubes becoming more swollen and filled with mucus.  This can delay other crucial treatments, such as oral steroid medications. If you need to use a bronchodilator inhaler daily, several times per day, you should discuss this with your provider.  There are probably better treatments that could be used to keep your asthma under control.     HOME CARE Only take medications as instructed by your medical team. Complete the entire course of an antibiotic. Drink plenty of fluids and get plenty of rest. Avoid close contacts especially the very young and the elderly Cover your mouth if you cough or cough into your sleeve. Always remember to wash your hands A steam or ultrasonic humidifier can help congestion.   GET HELP RIGHT AWAY IF: You develop worsening fever. You become short of breath You cough up blood. Your symptoms persist after you have completed your treatment plan MAKE SURE YOU  Understand these instructions. Will watch your condition. Will get help right away if you are not doing well or get worse.    Thank you for choosing an e-visit.  Your e-visit answers were reviewed by a board certified advanced clinical practitioner to complete your personal care plan. Depending upon the condition, your plan could have included both over the counter or prescription medications.  Please review your pharmacy choice. Make sure the pharmacy is open so you can pick up prescription  now. If there is a problem, you may contact your provider through [employer] and have the prescription routed to another pharmacy.  Your safety is important to [place]. If you have drug allergies check your prescription carefully.   For the next 24 hours you can use MyChart to ask questions about today's visit, request a non-urgent call back, or ask for a work or school excuse. You will get an email in the next two days  asking about your experience. I hope that your e-visit has been valuable and will speed your recovery.  I provided 5 minutes of non face-to-face time during this encounter for chart review and documentation.

## 2021-01-14 ENCOUNTER — Encounter: Admit: 2021-01-14 | Discharge: 2021-01-14

## 2021-01-14 NOTE — Progress Notes
50 yr old    Necrectomizing fasciitis   Anterior abdomen wall (gas present),   R buttocks inferiorly (gas present)  Right perineum  Mons pubis    Acute renal failure    Imaging:  CT     Labs:  WBC 22.98  CRP 24  Albumin 2.7  Glucose Normal  Cr 6.42  BUN 60  Lactate 1.0    Vitals:  T 96.9 BP 114/59 P 63 RR 16 SP02 97% on RA    Hx:  Diabetes  HTN    Reason for transfer:  Surgeon there too complex

## 2021-01-30 ENCOUNTER — Other Ambulatory Visit (HOSPITAL_COMMUNITY)
Admission: RE | Admit: 2021-01-30 | Discharge: 2021-01-30 | Disposition: A | Discharge status: Home / Self Care | Payer: 59 | Source: Ambulatory Visit | Attending: Internal Medicine | Admitting: Internal Medicine

## 2021-01-30 ENCOUNTER — Other Ambulatory Visit: Payer: Self-pay

## 2021-01-30 ENCOUNTER — Inpatient Hospital Stay (HOSPITAL_COMMUNITY): Payer: 59 | Attending: Hematology

## 2021-01-30 ENCOUNTER — Ambulatory Visit (HOSPITAL_COMMUNITY)
Admission: RE | Admit: 2021-01-30 | Discharge: 2021-01-30 | Disposition: A | Discharge status: Home / Self Care | Payer: 59 | Source: Ambulatory Visit | Attending: Hematology | Admitting: Hematology

## 2021-01-30 DIAGNOSIS — R922 Inconclusive mammogram: Secondary | ICD-10-CM | POA: Diagnosis not present

## 2021-01-30 DIAGNOSIS — I10 Essential (primary) hypertension: Secondary | ICD-10-CM | POA: Insufficient documentation

## 2021-01-30 DIAGNOSIS — Z114 Encounter for screening for human immunodeficiency virus [HIV]: Secondary | ICD-10-CM | POA: Insufficient documentation

## 2021-01-30 DIAGNOSIS — C50911 Malignant neoplasm of unspecified site of right female breast: Secondary | ICD-10-CM

## 2021-01-30 DIAGNOSIS — R059 Cough, unspecified: Secondary | ICD-10-CM | POA: Insufficient documentation

## 2021-01-30 DIAGNOSIS — E782 Mixed hyperlipidemia: Secondary | ICD-10-CM | POA: Insufficient documentation

## 2021-01-30 DIAGNOSIS — R739 Hyperglycemia, unspecified: Secondary | ICD-10-CM | POA: Insufficient documentation

## 2021-01-30 DIAGNOSIS — D0501 Lobular carcinoma in situ of right breast: Secondary | ICD-10-CM | POA: Insufficient documentation

## 2021-01-30 DIAGNOSIS — Z1159 Encounter for screening for other viral diseases: Secondary | ICD-10-CM | POA: Insufficient documentation

## 2021-01-30 DIAGNOSIS — E559 Vitamin D deficiency, unspecified: Secondary | ICD-10-CM | POA: Insufficient documentation

## 2021-01-30 DIAGNOSIS — N951 Menopausal and female climacteric states: Secondary | ICD-10-CM | POA: Insufficient documentation

## 2021-01-30 LAB — CBC WITH DIFFERENTIAL/PLATELET
Abs Immature Granulocytes: 0.01 10*3/uL (ref 0.00–0.07)
Basophils Absolute: 0 10*3/uL (ref 0.0–0.1)
Basophils Relative: 0 %
Eosinophils Absolute: 0 10*3/uL (ref 0.0–0.5)
Eosinophils Relative: 0 %
HCT: 42.5 % (ref 36.0–46.0)
Hemoglobin: 13.9 g/dL (ref 12.0–15.0)
Immature Granulocytes: 0 %
Lymphocytes Relative: 31 %
Lymphs Abs: 2.1 10*3/uL (ref 0.7–4.0)
MCH: 30.8 pg (ref 26.0–34.0)
MCHC: 32.7 g/dL (ref 30.0–36.0)
MCV: 94.2 fL (ref 80.0–100.0)
Monocytes Absolute: 0.5 10*3/uL (ref 0.1–1.0)
Monocytes Relative: 7 %
Neutro Abs: 4.3 10*3/uL (ref 1.7–7.7)
Neutrophils Relative %: 62 %
Platelets: 204 10*3/uL (ref 150–400)
RBC: 4.51 MIL/uL (ref 3.87–5.11)
RDW: 12.2 % (ref 11.5–15.5)
WBC: 6.9 10*3/uL (ref 4.0–10.5)
nRBC: 0 % (ref 0.0–0.2)

## 2021-01-30 LAB — LIPID PANEL
Cholesterol: 258 mg/dL — ABNORMAL HIGH (ref 0–200)
HDL: 63 mg/dL (ref >40)
LDL Cholesterol: 158 mg/dL — ABNORMAL HIGH (ref 0–99)
Total CHOL/HDL Ratio: 4.1 RATIO
Triglycerides: 183 mg/dL — ABNORMAL HIGH (ref <150)
VLDL: 37 mg/dL (ref 0–40)

## 2021-01-30 LAB — VITAMIN D 25 HYDROXY (VIT D DEFICIENCY, FRACTURES): Vit D, 25-Hydroxy: 40.28 ng/mL (ref 30–100)

## 2021-01-30 LAB — COMPREHENSIVE METABOLIC PANEL
ALT: 42 U/L (ref 0–44)
AST: 27 U/L (ref 15–41)
Albumin: 3.8 g/dL (ref 3.5–5.0)
Alkaline Phosphatase: 53 U/L (ref 38–126)
Anion gap: 7 (ref 5–15)
BUN: 21 mg/dL — ABNORMAL HIGH (ref 6–20)
CO2: 23 mmol/L (ref 22–32)
Calcium: 9.2 mg/dL (ref 8.9–10.3)
Chloride: 108 mmol/L (ref 98–111)
Creatinine, Ser: 1.01 mg/dL — ABNORMAL HIGH (ref 0.44–1.00)
GFR, Estimated: >60 mL/min (ref >60)
Glucose, Bld: 111 mg/dL — ABNORMAL HIGH (ref 70–99)
Potassium: 4.1 mmol/L (ref 3.5–5.1)
Sodium: 138 mmol/L (ref 135–145)
Total Bilirubin: 0.6 mg/dL (ref 0.3–1.2)
Total Protein: 7.5 g/dL (ref 6.5–8.1)

## 2021-01-30 LAB — HEMOGLOBIN A1C
Hgb A1c MFr Bld: 5.8 % — ABNORMAL HIGH (ref 4.8–5.6)
Mean Plasma Glucose: 119.76 mg/dL

## 2021-01-30 LAB — HIV ANTIBODY (ROUTINE TESTING W REFLEX): HIV Screen 4th Generation wRfx: NONREACTIVE

## 2021-01-30 LAB — HEPATITIS C ANTIBODY: HCV Ab: NONREACTIVE

## 2021-01-30 LAB — TSH: TSH: 1.067 u[IU]/mL (ref 0.350–4.500)

## 2021-02-05 NOTE — Progress Notes (Signed)
[name], [name]   Patient Care Team: Lindell Spar, MD as PCP - General (Internal Medicine)  SUMMARY OF ONCOLOGIC HISTORY: Oncology History   No history exists.    CHIEF COMPLIANT: Follow-up for right breast cancer   INTERVAL HISTORY: [name] is a 50 y.o. female here today for follow up of her right breast cancer. Her last visit was on 06/12/2020.   Today she reports feeling well. She stopped tamoxifen and Effexor 6 months ago due to hot flashes which have since improved; she now only gets occasional mild hot flashes. She admits she has not been drinking enough water. She reports a cough productive of a small amount of clear sputum which has been constant since a Covid infection 1 month ago and is worsened when eating; this has been treated with Z-Pak and prednisone which has not helped.  REVIEW OF SYSTEMS:   Review of Systems  Constitutional:  Negative for appetite change.  HENT:   Positive for trouble swallowing.   Respiratory:  Positive for cough and shortness of breath.   Endocrine: Positive for hot flashes (occasional; mild).  Psychiatric/Behavioral:  Positive for sleep disturbance.   All other systems reviewed and are negative.  I have reviewed the past medical history, past surgical history, social history and family history with the patient and they are unchanged from previous note.   ALLERGIES:   is allergic to augmentin [amoxicillin-pot clavulanate].   MEDICATIONS:  Current Outpatient Medications  Medication Sig Dispense Refill   lisinopril-hydrochlorothiazide (ZESTORETIC) 10-12.5 MG tablet Take 1 tablet by mouth daily. 90 tablet 1   predniSONE (STERAPRED UNI-PAK 21 TAB) 10 MG (21) TBPK tablet 6 day taper; take as directed on package instructions 21 tablet 0   benzonatate (TESSALON) 100 MG capsule Take 1 capsule (100 mg total) by mouth 2 (two) times daily as needed for cough. (Patient not taking: Reported  on 02/06/2021) 20 capsule 0   ibuprofen (ADVIL,MOTRIN) 200 MG tablet Take 200 mg by mouth every 6 (six) hours as needed. (Patient not taking: Reported on 02/06/2021)     No current facility-administered medications for this visit.     PHYSICAL EXAMINATION: Performance status (ECOG): 0 - Asymptomatic  Vitals:   02/06/21 1604  BP: 120/84  Pulse: 84  Resp: 18  Temp: 97.9 F (36.6 C)  SpO2: 100%   Wt Readings from Last 3 Encounters:  02/06/21 244 lb (110.7 kg)  10/26/20 239 lb 1.9 oz (108.5 kg)  09/08/20 234 lb (106.1 kg)   Physical Exam Vitals reviewed.  Constitutional:      Appearance: Normal appearance.  Cardiovascular:     Rate and Rhythm: Normal rate and regular rhythm.     Pulses: Normal pulses.     Heart sounds: Normal heart sounds.  Pulmonary:     Effort: Pulmonary effort is normal.     Breath sounds: Normal breath sounds.  Neurological:     General: No focal deficit present.     Mental Status: She is alert and oriented to person, place, and time.  Psychiatric:        Mood and Affect: Mood normal.        Behavior: Behavior normal.    Breast Exam Chaperone: Thana Ates     LABORATORY DATA:  I have reviewed the data as listed CMP Latest Ref Rng & Units 01/30/2021 06/05/2020 11/30/2019  Glucose 70 - 99 mg/dL 111(H) 113(H) 112(H)  BUN 6 -  20 mg/dL 21(H) 14 17  Creatinine 0.44 - 1.00 mg/dL 1.01(H) 0.93 0.93  Sodium 135 - 145 mmol/L 138 135 139  Potassium 3.5 - 5.1 mmol/L 4.1 4.7 5.3(H)  Chloride 98 - 111 mmol/L 108 103 104  CO2 22 - 32 mmol/L 23 23 26   Calcium 8.9 - 10.3 mg/dL 9.2 9.0 9.5  Total Protein 6.5 - 8.1 g/dL 7.5 7.3 7.3  Total Bilirubin 0.3 - 1.2 mg/dL 0.6 0.7 0.6  Alkaline Phos 38 - 126 U/L 53 59 45  AST 15 - 41 U/L 27 26 25   ALT 0 - 44 U/L 42 30 32   No results found for: CAN153 Lab Results  Component Value Date   WBC 6.9 01/30/2021   HGB 13.9 01/30/2021   HCT 42.5 01/30/2021   MCV 94.2 01/30/2021   PLT 204 01/30/2021   NEUTROABS 4.3  01/30/2021    ASSESSMENT:  1.  Recurrent right breast LCIS: -Initially diagnosed with right breast LCIS in 2012, status post lumpectomy, ER/PR positive, did not take tamoxifen. -Recurrent right breast LCIS, status post lumpectomy on 10/08/2016. -Tamoxifen started in September 2018. -She reports that she has not been taking tamoxifen on a regular basis as she forgets.  However she is doing much better lately. -She is experiencing 1-2 hot flashes during the daytime.  No heart flashes at nighttime. -She had a Pap smear beginning of April which was negative.  She does not report any vaginal bleeding. -Last mammogram on 11/10/2018 was BI-RADS Category 2. -Given the high density of her breast, I have recommended periodic MRIs.  We will order a breast MRI and follow-up with a phone visit.   PLAN:  1.  Recurrent right breast LCIS: - She reportedly quit taking tamoxifen 6 months ago secondary to hot flashes. - Reviewed labs from 01/30/2021 which showed normal LFTs.  Creatinine mildly elevated at 1.01 likely from blood pressure medication.  CBC was normal.  Vitamin D was normal. - Reviewed mammogram bilateral from 01/30/2021 which was BI-RADS Category 2. - She reportedly had breast exam last week.  Hence breast examination today was not done. - RTC 1 year for follow-up with repeat mammogram and labs.   2.  Hypertension: - Continue lisinopril/HCTZ.  Blood pressure today is well controlled.   3.  Hot flashes: - This has not been a problem since she stopped taking tamoxifen.  She also stopped taking Effexor.  She rarely has hot flashes at this time.  4.  Dry cough: - She reportedly had dry cough from acid reflux.  However she has worsening of cough since she had COVID a month ago.  She was reportedly treated with Z-Pak and prednisone with no improvement. - Lungs are clear to auscultation today.  Recommend chest x-ray PA and lateral.  If no lung pathology, she will talk to Dr. Posey Pronto about prescription  medicine for acid reflux.  She is currently taking over-the-counter Prilosec.  Breast Cancer therapy associated bone loss: I have recommended calcium, Vitamin D and weight bearing exercises.  Orders placed this encounter:  Orders Placed This Encounter  Procedures   DG Chest 2 View   MM Digital Screening   CBC with Differential/Platelet   Comprehensive metabolic panel    The patient has a good understanding of the overall plan. She agrees with it. She will call with any problems that may develop before the next visit here.  Derek Jack, MD [place] [phone]   I, Thana Ates, am acting as a  scribe for Dr. Derek Jack.  I, Derek Jack MD, have reviewed the above documentation for accuracy and completeness, and I agree with the above.

## 2021-02-06 ENCOUNTER — Inpatient Hospital Stay (HOSPITAL_BASED_OUTPATIENT_CLINIC_OR_DEPARTMENT_OTHER): Payer: 59 | Admitting: Hematology

## 2021-02-06 ENCOUNTER — Ambulatory Visit (HOSPITAL_COMMUNITY)
Admission: RE | Admit: 2021-02-06 | Discharge: 2021-02-06 | Disposition: A | Discharge status: Home / Self Care | Payer: 59 | Source: Ambulatory Visit | Attending: Hematology | Admitting: Hematology

## 2021-02-06 ENCOUNTER — Other Ambulatory Visit: Payer: Self-pay

## 2021-02-06 VITALS — BP 120/84 | HR 84 | Temp 97.9°F | Resp 18 | Ht 67.0 in | Wt 244.0 lb

## 2021-02-06 DIAGNOSIS — N951 Menopausal and female climacteric states: Secondary | ICD-10-CM | POA: Diagnosis not present

## 2021-02-06 DIAGNOSIS — I10 Essential (primary) hypertension: Secondary | ICD-10-CM

## 2021-02-06 DIAGNOSIS — D0501 Lobular carcinoma in situ of right breast: Secondary | ICD-10-CM | POA: Diagnosis not present

## 2021-02-06 DIAGNOSIS — R059 Cough, unspecified: Secondary | ICD-10-CM

## 2021-02-06 DIAGNOSIS — C50911 Malignant neoplasm of unspecified site of right female breast: Secondary | ICD-10-CM | POA: Diagnosis not present

## 2021-02-06 NOTE — Patient Instructions (Signed)
[place] at [facility] Discharge Instructions  You were seen and examined today by Dr. Delton Coombes. He reviewed your most recent labs and everything looks okay. Your creatinine is elevated but this can come from the lisinopril/ HCTZ. He recommends that you drink 2 liters of water daily. Please keep follow up appointment as scheduled in 1 year.   Thank you for choosing [place] at [facility] to provide your oncology and hematology care.  To afford each patient quality time with our provider, please arrive at least 15 minutes before your scheduled appointment time.   If you have a lab appointment with the [facility] please come in thru the Main Entrance and check in at the main information desk.  You need to re-schedule your appointment should you arrive 10 or more minutes late.  We strive to give you quality time with our providers, and arriving late affects you and other patients whose appointments are after yours.  Also, if you no show three or more times for appointments you may be dismissed from the clinic at the providers discretion.     Again, thank you for choosing [facility].  Our hope is that these requests will decrease the amount of time that you wait before being seen by our physicians.       _____________________________________________________________  Should you have questions after your visit to [facility], please contact our office at [phone] and follow the prompts.  Our office hours are 8:00 a.m. and 4:30 p.m. Monday - Friday.  Please note that voicemails left after 4:00 p.m. may not be returned until the following business day.  We are closed weekends and major holidays.  You do have access to a nurse 24-7, just call the main number to the clinic [phone] and do not press any options, hold on the line and a nurse will answer the phone.    For prescription refill requests, have your pharmacy  contact our office and allow 72 hours.    Due to Covid, you will need to wear a mask upon entering the hospital. If you do not have a mask, a mask will be given to you at the Main Entrance upon arrival. For doctor visits, patients may have 1 support person age 71 or older with them. For treatment visits, patients can not have anyone with them due to social distancing guidelines and our immunocompromised population.

## 2021-02-15 ENCOUNTER — Ambulatory Visit: Payer: 59 | Admitting: Orthopaedic Surgery

## 2021-02-15 ENCOUNTER — Encounter: Payer: Self-pay | Admitting: Orthopaedic Surgery

## 2021-02-15 ENCOUNTER — Other Ambulatory Visit: Payer: Self-pay

## 2021-02-15 DIAGNOSIS — G8929 Other chronic pain: Secondary | ICD-10-CM

## 2021-02-15 DIAGNOSIS — M25561 Pain in right knee: Secondary | ICD-10-CM

## 2021-02-15 NOTE — Progress Notes (Signed)
PROCEDURE NOTE:  The patient requests injections of the right knee , verbal consent was obtained.  The right knee was prepped appropriately after time out was performed.   Sterile technique was observed and injection of 1 cc of DepoMedrol 40mg  with several cc's of plain xylocaine. Anesthesia was provided by ethyl chloride and a 20-gauge needle was used to inject the knee area. The injection was tolerated well.  A band aid dressing was applied.  The patient was advised to apply ice later today and tomorrow to the injection sight as needed.  Encounter Diagnosis  Name Primary?   Chronic pain of right knee Yes   I will see her prn.  She had pain in July and did well with injection.  If pain gets worse or knee gives way, consider MRI.  Call if any problem.  Precautions discussed.  Electronically Signed Sanjuana Kava, MD 1/5/20239:29 AM

## 2021-03-01 ENCOUNTER — Ambulatory Visit: Payer: 59 | Admitting: Internal Medicine

## 2021-03-01 ENCOUNTER — Other Ambulatory Visit (HOSPITAL_COMMUNITY): Payer: Self-pay

## 2021-03-01 ENCOUNTER — Other Ambulatory Visit: Payer: Self-pay

## 2021-03-01 ENCOUNTER — Encounter: Payer: Self-pay | Admitting: Internal Medicine

## 2021-03-01 VITALS — BP 132/84 | HR 86 | Resp 18 | Ht 67.0 in | Wt 241.0 lb

## 2021-03-01 DIAGNOSIS — E782 Mixed hyperlipidemia: Secondary | ICD-10-CM

## 2021-03-01 DIAGNOSIS — I10 Essential (primary) hypertension: Secondary | ICD-10-CM

## 2021-03-01 DIAGNOSIS — R7303 Prediabetes: Secondary | ICD-10-CM | POA: Diagnosis not present

## 2021-03-01 DIAGNOSIS — K219 Gastro-esophageal reflux disease without esophagitis: Secondary | ICD-10-CM

## 2021-03-01 DIAGNOSIS — Z23 Encounter for immunization: Secondary | ICD-10-CM

## 2021-03-01 DIAGNOSIS — E785 Hyperlipidemia, unspecified: Secondary | ICD-10-CM | POA: Insufficient documentation

## 2021-03-01 MED ORDER — OMEPRAZOLE 20 MG PO CPDR
20.0000 mg | DELAYED_RELEASE_CAPSULE | Freq: Every day | ORAL | 3 refills | Status: DC
  Filled 2021-03-01: qty 90, 90d supply, fill #0

## 2021-03-01 MED ORDER — WEGOVY 0.25 MG/0.5ML ~~LOC~~ SOAJ
0.2500 mg | SUBCUTANEOUS | 0 refills | Status: DC
  Filled 2021-03-01 – 2021-03-26 (×3): qty 2, 28d supply, fill #0

## 2021-03-01 NOTE — Assessment & Plan Note (Signed)
Lab Results  Component Value Date   HGBA1C 5.8 (H) 01/30/2021   Continue to follow low carb diet for now

## 2021-03-01 NOTE — Assessment & Plan Note (Signed)
BMI Readings from Last 2 Encounters:  03/01/21 37.75 kg/m  02/06/21 38.22 kg/m   Has tried diet modification - low carb diet and regular exercise regimen for more than 6 months Her weight has been stable since last visit She has obesity related complications - prediabetes, HLD, HTN and GERD Started JSCBIP, plan to escalate dose as tolerated

## 2021-03-01 NOTE — Assessment & Plan Note (Signed)
Has been taking Omeprazole QD with relief Prescribed Omeprazole 20 mg QD Avoid hot and spicy food

## 2021-03-01 NOTE — Assessment & Plan Note (Signed)
BP Readings from Last 1 Encounters:  03/01/21 132/84   Well-controlled now with Lisinopril-HCTZ  Counseled for compliance with the medications Advised DASH diet and moderate exercise/walking, at least 150 mins/week

## 2021-03-01 NOTE — Progress Notes (Signed)
Established Patient Office Visit  Subjective:  Patient ID: [name], female    DOB: 26-Aug-1970  Age: 51 y.o. MRN: [id number]  CC:  Chief Complaint  Patient presents with   Follow-up    4 month follow up     HPI [name] is a 51 y.o. female with past medical history of HTN, recurrent breast cancer (ER/PR positive) s/p lumpectomy, anxiety, hot flashes and obesity who presents for f/u of her chronic medical conditions.  HTN: BP is well-controlled now. Takes medications regularly. Patient denies headache, dizziness, chest pain, dyspnea or palpitations.  Her blood tests showed prediabetes and HLD.  She has been trying to follow low-carb diet and has been exercising regularly.  She has not been able to lose weight despite her efforts.  She complains of acid reflux and intermittent nausea, for which she has been taking OTC Omeprazole.  She denies any dysphagia or odynophagia.  She received first dose of Shingrix vaccine in the office today.    Past Medical History:  Diagnosis Date   Breast cancer ([place]) [phone]   rt breast   Breast disorder    cancer right breast   Cancer ([place])    right lobular carcinoma in situ   Gastroparesis    controlled    Past Surgical History:  Procedure Laterality Date   BACK SURGERY  10/2002   lumbar hemilaminectomy, microdiscectomy L5-S1   BREAST BIOPSY Right 07/2016   malignant; waiting on MRI results   BREAST LUMPECTOMY Right 2012,2018   BREAST LUMPECTOMY WITH RADIOACTIVE SEED LOCALIZATION Right 10/08/2016   Procedure: RIGHT BREAST LUMPECTOMY WITH RADIOACTIVE SEED LOCALIZATION;  Surgeon: Fanny Skates, MD;  Location: [facility];  Service: General;  Laterality: Right;   BREAST SURGERY     TUBAL LIGATION  2001    Family History  Problem Relation Age of Onset   Hypertension Mother    Hyperlipidemia Mother    Diverticulitis Mother    Hernia Mother    Heart disease Father        arrythmia   Hypertension Father     Cancer Maternal Uncle        pancreatic   Congestive Heart Failure Paternal Grandmother    Heart attack Maternal Grandmother    Aneurysm Maternal Grandfather    Arthritis Brother     Social History   Socioeconomic History   Marital status: Married    Spouse name: Not on file   Number of children: Not on file   Years of education: Not on file   Highest education level: Not on file  Occupational History   Not on file  Tobacco Use   Smoking status: Former    Packs/day: 1.00    Years: 16.00    Pack years: 16.00    Types: Cigarettes    Quit date: 08/12/2000    Years since quitting: 20.5   Smokeless tobacco: Never   Tobacco comments:    quit 10 yrs  Vaping Use   Vaping Use: Never used  Substance and Sexual Activity   Alcohol use: Yes    Comment: occasionally   Drug use: No   Sexual activity: Yes    Birth control/protection: Surgical    Comment: tubal  Other Topics Concern   Not on file  Social History Narrative   Not on file   Social Determinants of Health   Financial Resource Strain: Low Risk    Difficulty of Paying Living Expenses: Not hard at all  Food  Insecurity: No Food Insecurity   Worried About Charity fundraiser in the Last Year: Never true   Ran Out of Food in the Last Year: Never true  Transportation Needs: No Transportation Needs   Lack of Transportation (Medical): No   Lack of Transportation (Non-Medical): No  Physical Activity: Insufficiently Active   Days of Exercise per Week: 1 day   Minutes of Exercise per Session: 30 min  Stress: No Stress Concern Present   Feeling of Stress : Only a little  Social Connections: Moderately Integrated   Frequency of Communication with Friends and Family: More than three times a week   Frequency of Social Gatherings with Friends and Family: Three times a week   Attends Religious Services: Never   Active Member of Clubs or Organizations: Yes   Attends Music therapist: More than 4 times per year    Marital Status: Married  Human resources officer Violence: Not At Risk   Fear of Current or Ex-Partner: No   Emotionally Abused: No   Physically Abused: No   Sexually Abused: No    Outpatient Medications Prior to Visit  Medication Sig Dispense Refill   ibuprofen (ADVIL,MOTRIN) 200 MG tablet Take 200 mg by mouth every 6 (six) hours as needed.     lisinopril-hydrochlorothiazide (ZESTORETIC) 10-12.5 MG tablet Take 1 tablet by mouth daily. 90 tablet 1   No facility-administered medications prior to visit.    Allergies  Allergen Reactions   Augmentin [Amoxicillin-Pot Clavulanate] Hives    ROS Review of Systems  Constitutional:  Negative for chills and fever.  HENT:  Negative for congestion, sinus pressure, sinus pain and sore throat.   Eyes:  Negative for pain and discharge.  Respiratory:  Negative for cough and shortness of breath.   Cardiovascular:  Negative for chest pain and palpitations.  Gastrointestinal:  Negative for constipation, diarrhea, nausea and vomiting.  Endocrine: Negative for polydipsia and polyuria.  Genitourinary:  Negative for dysuria and hematuria.  Musculoskeletal:  Negative for neck pain and neck stiffness.  Skin:  Negative for rash.  Neurological:  Negative for dizziness and weakness.  Psychiatric/Behavioral:  Negative for agitation and behavioral problems.      Objective:    Physical Exam Vitals reviewed.  Constitutional:      General: She is not in acute distress.    Appearance: She is obese. She is not diaphoretic.  HENT:     Head: Normocephalic and atraumatic.     Nose: Nose normal.     Mouth/Throat:     Mouth: Mucous membranes are moist.  Eyes:     General: No scleral icterus.    Extraocular Movements: Extraocular movements intact.  Cardiovascular:     Rate and Rhythm: Normal rate and regular rhythm.     Pulses: Normal pulses.     Heart sounds: Normal heart sounds. No murmur heard. Pulmonary:     Breath sounds: Normal breath sounds. No  wheezing or rales.  Abdominal:     Palpations: Abdomen is soft.     Tenderness: There is no abdominal tenderness.  Musculoskeletal:     Cervical back: Neck supple. No tenderness.     Right lower leg: No edema.     Left lower leg: No edema.  Skin:    General: Skin is warm.     Findings: No rash.  Neurological:     General: No focal deficit present.     Mental Status: She is alert and oriented to person, place, and time.  Psychiatric:        Mood and Affect: Mood normal.        Behavior: Behavior normal.    BP 132/84 (BP Location: Right Arm, Patient Position: Sitting, Cuff Size: Normal)    Pulse 86    Resp 18    Ht 5\' 7"  (1.702 m)    Wt 241 lb (109.3 kg)    SpO2 99%    BMI 37.75 kg/m  Wt Readings from Last 3 Encounters:  03/01/21 241 lb (109.3 kg)  02/06/21 244 lb (110.7 kg)  10/26/20 239 lb 1.9 oz (108.5 kg)    Lab Results  Component Value Date   TSH 1.067 01/30/2021   Lab Results  Component Value Date   WBC 6.9 01/30/2021   HGB 13.9 01/30/2021   HCT 42.5 01/30/2021   MCV 94.2 01/30/2021   PLT 204 01/30/2021   Lab Results  Component Value Date   NA 138 01/30/2021   K 4.1 01/30/2021   CO2 23 01/30/2021   GLUCOSE 111 (H) 01/30/2021   BUN 21 (H) 01/30/2021   CREATININE 1.01 (H) 01/30/2021   BILITOT 0.6 01/30/2021   ALKPHOS 53 01/30/2021   AST 27 01/30/2021   ALT 42 01/30/2021   PROT 7.5 01/30/2021   ALBUMIN 3.8 01/30/2021   CALCIUM 9.2 01/30/2021   ANIONGAP 7 01/30/2021   Lab Results  Component Value Date   CHOL 258 (H) 01/30/2021   Lab Results  Component Value Date   HDL 63 01/30/2021   Lab Results  Component Value Date   LDLCALC 158 (H) 01/30/2021   Lab Results  Component Value Date   TRIG 183 (H) 01/30/2021   Lab Results  Component Value Date   CHOLHDL 4.1 01/30/2021   Lab Results  Component Value Date   HGBA1C 5.8 (H) 01/30/2021      Assessment & Plan:   Problem List Items Addressed This Visit       Cardiovascular and Mediastinum    Hypertension - Primary    BP Readings from Last 1 Encounters:  03/01/21 132/84  Well-controlled now with Lisinopril-HCTZ  Counseled for compliance with the medications Advised DASH diet and moderate exercise/walking, at least 150 mins/week         Digestive   Gastroesophageal reflux disease without esophagitis    Has been taking Omeprazole QD with relief Prescribed Omeprazole 20 mg QD Avoid hot and spicy food      Relevant Medications   omeprazole (PRILOSEC) 20 MG capsule     Other   Prediabetes    Lab Results  Component Value Date   HGBA1C 5.8 (H) 01/30/2021  Continue to follow low carb diet for now      HLD (hyperlipidemia)    Lipid profile reviewed Advised to follow low carb diet for now      Morbid obesity ([place])    BMI Readings from Last 2 Encounters:  03/01/21 37.75 kg/m  02/06/21 38.22 kg/m  Has tried diet modification - low carb diet and regular exercise regimen for more than 6 months Her weight has been stable since last visit She has obesity related complications - prediabetes, HLD, HTN and GERD Started Wegovy, plan to escalate dose as tolerated      Relevant Medications   Semaglutide-Weight Management (WEGOVY) 0.25 MG/0.5ML SOAJ    Meds ordered this encounter  Medications   Semaglutide-Weight Management (WEGOVY) 0.25 MG/0.5ML SOAJ    Sig: Inject 0.25 mg into the skin every 7 (seven) days.    Dispense:  2 mL    Refill:  0   omeprazole (PRILOSEC) 20 MG capsule    Sig: Take 1 capsule (20 mg total) by mouth daily.    Dispense:  90 capsule    Refill:  3    Follow-up: Return in about 3 months (around 05/30/2021) for Weight management.    Lindell Spar, MD

## 2021-03-01 NOTE — Patient Instructions (Signed)
Please continue taking Lisinopril-HCTZ as prescribed.  Please start taking Wegovy as prescribed.  Continue to follow low carb diet and perform moderate exercise/walking at least 150 mins/week.

## 2021-03-01 NOTE — Assessment & Plan Note (Signed)
Lipid profile reviewed Advised to follow low-carb diet for now 

## 2021-03-01 NOTE — Addendum Note (Signed)
Addended by: Zacarias Pontes R on: 03/01/2021 11:26 AM   Modules accepted: Orders

## 2021-03-02 ENCOUNTER — Other Ambulatory Visit (HOSPITAL_COMMUNITY): Payer: Self-pay

## 2021-03-06 ENCOUNTER — Other Ambulatory Visit (HOSPITAL_COMMUNITY): Payer: Self-pay

## 2021-03-07 ENCOUNTER — Encounter: Payer: Self-pay | Admitting: Internal Medicine

## 2021-03-08 ENCOUNTER — Encounter: Payer: Self-pay | Admitting: *Deleted

## 2021-03-26 ENCOUNTER — Other Ambulatory Visit (HOSPITAL_COMMUNITY): Payer: Self-pay

## 2021-04-11 ENCOUNTER — Other Ambulatory Visit (HOSPITAL_COMMUNITY): Payer: Self-pay

## 2021-04-11 ENCOUNTER — Other Ambulatory Visit: Payer: Self-pay | Admitting: Internal Medicine

## 2021-04-11 MED ORDER — WEGOVY 1.7 MG/0.75ML ~~LOC~~ SOAJ
1.7000 mg | SUBCUTANEOUS | 0 refills | Status: DC
  Filled 2021-04-11: qty 3, fill #0
  Filled 2021-06-19 – 2021-06-25 (×2): qty 3, 28d supply, fill #0

## 2021-04-11 MED ORDER — WEGOVY 2.4 MG/0.75ML ~~LOC~~ SOAJ
2.4000 mg | SUBCUTANEOUS | 0 refills | Status: DC
  Filled 2021-04-11: qty 3, fill #0
  Filled 2021-07-27: qty 3, 28d supply, fill #0

## 2021-04-11 MED ORDER — WEGOVY 1 MG/0.5ML ~~LOC~~ SOAJ
1.0000 mg | SUBCUTANEOUS | 0 refills | Status: DC
  Filled 2021-04-11: qty 2, fill #0
  Filled 2021-05-21: qty 2, 28d supply, fill #0

## 2021-04-11 MED ORDER — WEGOVY 0.5 MG/0.5ML ~~LOC~~ SOAJ
0.5000 mg | SUBCUTANEOUS | 0 refills | Status: DC
  Filled 2021-04-11: qty 2, 28d supply, fill #0

## 2021-04-12 ENCOUNTER — Other Ambulatory Visit (HOSPITAL_COMMUNITY): Payer: Self-pay

## 2021-04-25 DIAGNOSIS — R7303 Prediabetes: Secondary | ICD-10-CM | POA: Diagnosis not present

## 2021-04-25 DIAGNOSIS — E782 Mixed hyperlipidemia: Secondary | ICD-10-CM | POA: Diagnosis not present

## 2021-04-25 DIAGNOSIS — I10 Essential (primary) hypertension: Secondary | ICD-10-CM | POA: Diagnosis not present

## 2021-04-25 DIAGNOSIS — R1012 Left upper quadrant pain: Secondary | ICD-10-CM | POA: Diagnosis not present

## 2021-05-21 ENCOUNTER — Other Ambulatory Visit: Payer: Self-pay | Admitting: Internal Medicine

## 2021-05-21 ENCOUNTER — Other Ambulatory Visit (HOSPITAL_COMMUNITY): Payer: Self-pay

## 2021-05-21 ENCOUNTER — Encounter: Payer: Self-pay | Admitting: Internal Medicine

## 2021-05-30 ENCOUNTER — Other Ambulatory Visit (HOSPITAL_COMMUNITY): Payer: Self-pay

## 2021-05-30 ENCOUNTER — Encounter: Payer: Self-pay | Admitting: Internal Medicine

## 2021-05-30 ENCOUNTER — Ambulatory Visit: Payer: 59 | Admitting: Internal Medicine

## 2021-05-30 DIAGNOSIS — I10 Essential (primary) hypertension: Secondary | ICD-10-CM | POA: Diagnosis not present

## 2021-05-30 DIAGNOSIS — L821 Other seborrheic keratosis: Secondary | ICD-10-CM

## 2021-05-30 DIAGNOSIS — Z23 Encounter for immunization: Secondary | ICD-10-CM | POA: Diagnosis not present

## 2021-05-30 DIAGNOSIS — Z1211 Encounter for screening for malignant neoplasm of colon: Secondary | ICD-10-CM

## 2021-05-30 DIAGNOSIS — R7303 Prediabetes: Secondary | ICD-10-CM | POA: Diagnosis not present

## 2021-05-30 MED ORDER — LISINOPRIL-HYDROCHLOROTHIAZIDE 10-12.5 MG PO TABS
1.0000 | ORAL_TABLET | Freq: Every day | ORAL | 2 refills | Status: DC
  Filled 2021-05-30: qty 30, 30d supply, fill #0

## 2021-05-30 NOTE — Assessment & Plan Note (Signed)
BP Readings from Last 1 Encounters:  ?05/30/21 128/82  ? ?Well-controlled now, but needs to take Lisinopril-HCTZ regularly instead of as needed ?Counseled for compliance with the medications ?Advised DASH diet and moderate exercise/walking, at least 150 mins/week ? ?

## 2021-05-30 NOTE — Progress Notes (Signed)
? ?Established Patient Office Visit ? ?Subjective:  ?Patient ID: [name], female    DOB: [birth year]  Age: 51 y.o. MRN: [id number] ? ?CC:  ?Chief Complaint  ?Patient presents with  ? Follow-up  ?  3 month follow up for weight management has mole on right shoulder would like looked at   ? ? ?HPI ?[name] is a 51 y.o. female with past medical history of HTN, recurrent breast cancer (ER/PR positive) s/p lumpectomy, anxiety, hot flashes and obesity who presents for f/u of her chronic medical conditions. ? ?Obesity: She has lost about 7 lbs since the last visit.  She has been tolerating Wegovy well.  Denies any major nausea or vomiting.  She has also been trying to follow low-carb diet and tries to exercise on a regular basis. ? ?HTN: Her BP is well controlled today.  She has been taking lisinopril HCTZ on a as needed basis.  She agrees to take it regularly and notify us if her BP stays low.  She currently denies any headache, dizziness, chest pain, dyspnea or palpitations. ? ?She reports having a mole over her right shoulder area, which has been chronic, stable and has mild itching around it.  Denies any recent change in color or shape. ? ?Past Medical History:  ?Diagnosis Date  ? Breast cancer ([place]) [phone]  ? rt breast  ? Breast disorder   ? cancer right breast  ? Cancer ([place])   ? right lobular carcinoma in situ  ? Gastroparesis   ? controlled  ? ? ?Past Surgical History:  ?Procedure Laterality Date  ? BACK SURGERY  10/2002  ? lumbar hemilaminectomy, microdiscectomy L5-S1  ? BREAST BIOPSY Right 07/2016  ? malignant; waiting on MRI results  ? BREAST LUMPECTOMY Right 2012,2018  ? BREAST LUMPECTOMY WITH RADIOACTIVE SEED LOCALIZATION Right 10/08/2016  ? Procedure: RIGHT BREAST LUMPECTOMY WITH RADIOACTIVE SEED LOCALIZATION;  Surgeon: Fanny Skates, MD;  Location: [facility];  Service: General;  Laterality: Right;  ? BREAST SURGERY    ? TUBAL LIGATION  2001  ? ? ?Family History  ?Problem Relation Age  of Onset  ? Hypertension Mother   ? Hyperlipidemia Mother   ? Diverticulitis Mother   ? Hernia Mother   ? Heart disease Father   ?     arrythmia  ? Hypertension Father   ? Cancer Maternal Uncle   ?     pancreatic  ? Congestive Heart Failure Paternal Grandmother   ? Heart attack Maternal Grandmother   ? Aneurysm Maternal Grandfather   ? Arthritis Brother   ? ? ?Social History  ? ?Socioeconomic History  ? Marital status: Married  ?  Spouse name: Not on file  ? Number of children: Not on file  ? Years of education: Not on file  ? Highest education level: Not on file  ?Occupational History  ? Not on file  ?Tobacco Use  ? Smoking status: Former  ?  Packs/day: 1.00  ?  Years: 16.00  ?  Pack years: 16.00  ?  Types: Cigarettes  ?  Quit date: 08/12/2000  ?  Years since quitting: 20.8  ? Smokeless tobacco: Never  ? Tobacco comments:  ?  quit 10 yrs  ?Vaping Use  ? Vaping Use: Never used  ?Substance and Sexual Activity  ? Alcohol use: Yes  ?  Comment: occasionally  ? Drug use: No  ? Sexual activity: Yes  ?  Birth control/protection: Surgical  ?  Comment: tubal  ?  Other Topics Concern  ? Not on file  ?Social History Narrative  ? Not on file  ? ?Social Determinants of Health  ? ?Financial Resource Strain: Low Risk   ? Difficulty of Paying Living Expenses: Not hard at all  ?Food Insecurity: No Food Insecurity  ? Worried About Charity fundraiser in the Last Year: Never true  ? Ran Out of Food in the Last Year: Never true  ?Transportation Needs: No Transportation Needs  ? Lack of Transportation (Medical): No  ? Lack of Transportation (Non-Medical): No  ?Physical Activity: Insufficiently Active  ? Days of Exercise per Week: 1 day  ? Minutes of Exercise per Session: 30 min  ?Stress: No Stress Concern Present  ? Feeling of Stress : Only a little  ?Social Connections: Moderately Integrated  ? Frequency of Communication with Friends and Family: More than three times a week  ? Frequency of Social Gatherings with Friends and Family: Three  times a week  ? Attends Religious Services: Never  ? Active Member of Clubs or Organizations: Yes  ? Attends Archivist Meetings: More than 4 times per year  ? Marital Status: Married  ?Intimate Partner Violence: Not At Risk  ? Fear of Current or Ex-Partner: No  ? Emotionally Abused: No  ? Physically Abused: No  ? Sexually Abused: No  ? ? ?Outpatient Medications Prior to Visit  ?Medication Sig Dispense Refill  ? ibuprofen (ADVIL,MOTRIN) 200 MG tablet Take 200 mg by mouth every 6 (six) hours as needed.    ? omeprazole (PRILOSEC) 20 MG capsule Take 1 capsule (20 mg total) by mouth daily. 90 capsule 3  ? Semaglutide-Weight Management (WEGOVY) 1 MG/0.5ML SOAJ Inject 1 mg into the skin every 7 (seven) days. 2 mL 0  ? [START ON 06/06/2021] Semaglutide-Weight Management (WEGOVY) 1.7 MG/0.75ML SOAJ Inject 1.7 mg into the skin every 7 (seven) days. 3 mL 0  ? [START ON 07/04/2021] Semaglutide-Weight Management (WEGOVY) 2.4 MG/0.75ML SOAJ Inject 2.4 mg into the skin every 7 (seven) days. 3 mL 0  ? lisinopril-hydrochlorothiazide (ZESTORETIC) 10-12.5 MG tablet Take 1 tablet by mouth daily. 90 tablet 1  ? Semaglutide-Weight Management (WEGOVY) 0.5 MG/0.5ML SOAJ Inject 0.5 mg into the skin every 7 (seven) days. 2 mL 0  ? ?No facility-administered medications prior to visit.  ? ? ?Allergies  ?Allergen Reactions  ? Augmentin [Amoxicillin-Pot Clavulanate] Hives  ? ? ?ROS ?Review of Systems  ?Constitutional:  Negative for chills and fever.  ?HENT:  Negative for congestion, sinus pressure, sinus pain and sore throat.   ?Eyes:  Negative for pain and discharge.  ?Respiratory:  Negative for cough and shortness of breath.   ?Cardiovascular:  Negative for chest pain and palpitations.  ?Gastrointestinal:  Negative for constipation, diarrhea, nausea and vomiting.  ?Endocrine: Negative for polydipsia and polyuria.  ?Genitourinary:  Negative for dysuria and hematuria.  ?Musculoskeletal:  Negative for neck pain and neck stiffness.   ?Skin:   ?     Mole over right shoulder area  ?Neurological:  Negative for dizziness and weakness.  ?Psychiatric/Behavioral:  Negative for agitation and behavioral problems.   ? ?  ?Objective:  ?  ?Physical Exam ?Vitals reviewed.  ?Constitutional:   ?   General: She is not in acute distress. ?   Appearance: She is obese. She is not diaphoretic.  ?HENT:  ?   Head: Normocephalic and atraumatic.  ?   Nose: Nose normal.  ?   Mouth/Throat:  ?   Mouth: Mucous membranes  are moist.  ?Eyes:  ?   General: No scleral icterus. ?   Extraocular Movements: Extraocular movements intact.  ?Cardiovascular:  ?   Rate and Rhythm: Normal rate and regular rhythm.  ?   Pulses: Normal pulses.  ?   Heart sounds: Normal heart sounds. No murmur heard. ?Pulmonary:  ?   Breath sounds: Normal breath sounds. No wheezing or rales.  ?Abdominal:  ?   Palpations: Abdomen is soft.  ?   Tenderness: There is no abdominal tenderness.  ?Musculoskeletal:  ?   Cervical back: Neck supple. No tenderness.  ?   Right lower leg: No edema.  ?   Left lower leg: No edema.  ?Skin: ?   General: Skin is warm.  ?   Findings: No rash.  ?   Comments: Brownish papule over right shoulder area -oval-shaped  ?Neurological:  ?   General: No focal deficit present.  ?   Mental Status: She is alert and oriented to person, place, and time.  ?Psychiatric:     ?   Mood and Affect: Mood normal.     ?   Behavior: Behavior normal.  ? ? ?BP 128/82 (BP Location: Left Arm, Patient Position: Sitting, Cuff Size: Normal)   Pulse 80   Resp 18   Ht '5\' 7"'$  (1.702 m)   Wt 234 lb 12.8 oz (106.5 kg)   SpO2 97%   BMI 36.77 kg/m?  ?Wt Readings from Last 3 Encounters:  ?05/30/21 234 lb 12.8 oz (106.5 kg)  ?03/01/21 241 lb (109.3 kg)  ?02/06/21 244 lb (110.7 kg)  ? ? ?Lab Results  ?Component Value Date  ? TSH 1.067 01/30/2021  ? ?Lab Results  ?Component Value Date  ? WBC 6.9 01/30/2021  ? HGB 13.9 01/30/2021  ? HCT 42.5 01/30/2021  ? MCV 94.2 01/30/2021  ? PLT 204 01/30/2021  ? ?Lab Results   ?Component Value Date  ? NA 138 01/30/2021  ? K 4.1 01/30/2021  ? CO2 23 01/30/2021  ? GLUCOSE 111 (H) 01/30/2021  ? BUN 21 (H) 01/30/2021  ? CREATININE 1.01 (H) 01/30/2021  ? BILITOT 0.6 01/30/2021  ? ALKPHOS 53 12/

## 2021-05-30 NOTE — Assessment & Plan Note (Signed)
Mole over the right shoulder likely seborrheic keratosis ?Reassured it being benign ?Observe for now ?

## 2021-05-30 NOTE — Assessment & Plan Note (Signed)
Lab Results  Component Value Date   HGBA1C 5.8 (H) 01/30/2021   Continue to follow low carb diet for now On Wegovy for obesity 

## 2021-05-30 NOTE — Patient Instructions (Signed)
Please continue to follow low carb diet and perform moderate exercise/walking at least 150 mins/week. ? ?Please continue to increase dose of Wegovy as prescribed. ?

## 2021-05-30 NOTE — Assessment & Plan Note (Addendum)
BMI Readings from Last 2 Encounters:  ?05/30/21 36.77 kg/m?  ?03/01/21 37.75 kg/m?  ? ? ?Has lost 7 lbs since last visit ?Has tried diet modification - low carb diet and regular exercise regimen ?She has obesity related complications - prediabetes, HLD, HTN and GERD ?Started XUXYBF, initial BMI - 37.75 ?Plan to escalate dose as tolerated ?

## 2021-05-31 ENCOUNTER — Encounter: Payer: Self-pay | Admitting: Internal Medicine

## 2021-06-19 ENCOUNTER — Other Ambulatory Visit (HOSPITAL_COMMUNITY): Payer: Self-pay

## 2021-06-21 ENCOUNTER — Other Ambulatory Visit (HOSPITAL_COMMUNITY): Payer: Self-pay

## 2021-06-25 ENCOUNTER — Other Ambulatory Visit (HOSPITAL_COMMUNITY): Payer: Self-pay

## 2021-06-26 ENCOUNTER — Other Ambulatory Visit (HOSPITAL_COMMUNITY): Payer: Self-pay

## 2021-06-27 ENCOUNTER — Other Ambulatory Visit (HOSPITAL_COMMUNITY): Payer: Self-pay

## 2021-06-29 ENCOUNTER — Other Ambulatory Visit (HOSPITAL_COMMUNITY): Payer: Self-pay

## 2021-07-17 ENCOUNTER — Encounter: Payer: Self-pay | Admitting: *Deleted

## 2021-07-27 ENCOUNTER — Other Ambulatory Visit (HOSPITAL_COMMUNITY): Payer: Self-pay

## 2021-07-30 ENCOUNTER — Ambulatory Visit: Payer: 59

## 2021-07-30 ENCOUNTER — Other Ambulatory Visit (HOSPITAL_COMMUNITY): Payer: Self-pay

## 2021-08-29 ENCOUNTER — Ambulatory Visit: Payer: 59 | Admitting: Internal Medicine

## 2021-09-06 ENCOUNTER — Other Ambulatory Visit (HOSPITAL_COMMUNITY): Payer: Self-pay

## 2021-09-06 ENCOUNTER — Ambulatory Visit (INDEPENDENT_AMBULATORY_CARE_PROVIDER_SITE_OTHER): Payer: 59 | Admitting: Internal Medicine

## 2021-09-06 ENCOUNTER — Encounter: Payer: Self-pay | Admitting: Internal Medicine

## 2021-09-06 VITALS — BP 112/78 | HR 75 | Ht 67.0 in | Wt 219.8 lb

## 2021-09-06 DIAGNOSIS — Z0001 Encounter for general adult medical examination with abnormal findings: Secondary | ICD-10-CM | POA: Diagnosis not present

## 2021-09-06 DIAGNOSIS — E559 Vitamin D deficiency, unspecified: Secondary | ICD-10-CM | POA: Diagnosis not present

## 2021-09-06 DIAGNOSIS — E782 Mixed hyperlipidemia: Secondary | ICD-10-CM | POA: Diagnosis not present

## 2021-09-06 DIAGNOSIS — I10 Essential (primary) hypertension: Secondary | ICD-10-CM | POA: Diagnosis not present

## 2021-09-06 DIAGNOSIS — R7303 Prediabetes: Secondary | ICD-10-CM

## 2021-09-06 MED ORDER — WEGOVY 2.4 MG/0.75ML ~~LOC~~ SOAJ
2.4000 mg | SUBCUTANEOUS | 5 refills | Status: DC
  Filled 2021-09-06: qty 3, 28d supply, fill #0
  Filled 2021-10-25: qty 3, 28d supply, fill #1
  Filled 2021-12-14: qty 3, 28d supply, fill #2

## 2021-09-06 NOTE — Progress Notes (Signed)
Established Patient Office Visit  Subjective:  Patient ID: [name], female    DOB: 1970/07/01  Age: 51 y.o. MRN: [id number]  CC:  Chief Complaint  Patient presents with   Annual Exam    HPI [name] is a 51 y.o. female with past medical history of HTN, recurrent breast cancer (ER/PR positive) s/p lumpectomy, anxiety, hot flashes and obesity who presents for annual physical.  Obesity: She has lost about 15 lbs since the last visit.  She has been tolerating Wegovy well.  Denies any major nausea or vomiting.  She has also been trying to follow low-carb diet and tries to exercise on a regular basis.   HTN: Her BP is well controlled today.  She has not been taking lisinopril HCTZ.  She agrees to notify us if her BP stays high at home.  She currently denies any headache, dizziness, chest pain, dyspnea or palpitations.     Past Medical History:  Diagnosis Date   Breast cancer ([place]) [id number]   rt breast   Breast disorder    cancer right breast   Cancer ([place])    right lobular carcinoma in situ   Gastroparesis    controlled    Past Surgical History:  Procedure Laterality Date   BACK SURGERY  10/2002   lumbar hemilaminectomy, microdiscectomy L5-S1   BREAST BIOPSY Right 07/2016   malignant; waiting on MRI results   BREAST LUMPECTOMY Right 2012,2018   BREAST LUMPECTOMY WITH RADIOACTIVE SEED LOCALIZATION Right 10/08/2016   Procedure: RIGHT BREAST LUMPECTOMY WITH RADIOACTIVE SEED LOCALIZATION;  Surgeon: Fanny Skates, MD;  Location: [facility];  Service: General;  Laterality: Right;   BREAST SURGERY     TUBAL LIGATION  2001    Family History  Problem Relation Age of Onset   Hypertension Mother    Hyperlipidemia Mother    Diverticulitis Mother    Hernia Mother    Heart disease Father        arrythmia   Hypertension Father    Cancer Maternal Uncle        pancreatic   Congestive Heart Failure Paternal Grandmother    Heart attack Maternal Grandmother     Aneurysm Maternal Grandfather    Arthritis Brother     Social History   Socioeconomic History   Marital status: Married    Spouse name: Not on file   Number of children: Not on file   Years of education: Not on file   Highest education level: Not on file  Occupational History   Not on file  Tobacco Use   Smoking status: Former    Packs/day: 1.00    Years: 16.00    Total pack years: 16.00    Types: Cigarettes    Quit date: 08/12/2000    Years since quitting: 21.0   Smokeless tobacco: Never   Tobacco comments:    quit 10 yrs  Vaping Use   Vaping Use: Never used  Substance and Sexual Activity   Alcohol use: Yes    Comment: occasionally   Drug use: No   Sexual activity: Yes    Birth control/protection: Surgical    Comment: tubal  Other Topics Concern   Not on file  Social History Narrative   Not on file   Social Determinants of Health   Financial Resource Strain: Low Risk  (09/08/2020)   Overall Financial Resource Strain (CARDIA)    Difficulty of Paying Living Expenses: Not hard at all  Food Insecurity: No  Food Insecurity (09/08/2020)   Hunger Vital Sign    Worried About Running Out of Food in the Last Year: Never true    Ran Out of Food in the Last Year: Never true  Transportation Needs: No Transportation Needs (09/08/2020)   PRAPARE - Hydrologist (Medical): No    Lack of Transportation (Non-Medical): No  Physical Activity: Insufficiently Active (09/08/2020)   Exercise Vital Sign    Days of Exercise per Week: 1 day    Minutes of Exercise per Session: 30 min  Stress: No Stress Concern Present (09/08/2020)   [place]    Feeling of Stress : Only a little  Social Connections: Moderately Integrated (09/08/2020)   Social Connection and Isolation Panel [NHANES]    Frequency of Communication with Friends and Family: More than three times a week    Frequency of Social Gatherings  with Friends and Family: Three times a week    Attends Religious Services: Never    Active Member of Clubs or Organizations: Yes    Attends Archivist Meetings: More than 4 times per year    Marital Status: Married  Human resources officer Violence: Not At Risk (09/08/2020)   Humiliation, Afraid, Rape, and Kick questionnaire    Fear of Current or Ex-Partner: No    Emotionally Abused: No    Physically Abused: No    Sexually Abused: No    Outpatient Medications Prior to Visit  Medication Sig Dispense Refill   omeprazole (PRILOSEC) 20 MG capsule Take 1 capsule (20 mg total) by mouth daily. 90 capsule 3   ibuprofen (ADVIL,MOTRIN) 200 MG tablet Take 200 mg by mouth every 6 (six) hours as needed.     Semaglutide-Weight Management (WEGOVY) 1.7 MG/0.75ML SOAJ Inject 1.7 mg into the skin every 7 days. 3 mL 0   Semaglutide-Weight Management (WEGOVY) 2.4 MG/0.75ML SOAJ Inject 2.4 mg into the skin every 7 (seven) days. 3 mL 0   lisinopril-hydrochlorothiazide (ZESTORETIC) 10-12.5 MG tablet Take 1 tablet by mouth daily. 30 tablet 2   No facility-administered medications prior to visit.    Allergies  Allergen Reactions   Augmentin [Amoxicillin-Pot Clavulanate] Hives    ROS Review of Systems  Constitutional:  Negative for chills and fever.  HENT:  Negative for congestion, sinus pressure, sinus pain and sore throat.   Eyes:  Negative for pain and discharge.  Respiratory:  Negative for cough and shortness of breath.   Cardiovascular:  Negative for chest pain and palpitations.  Gastrointestinal:  Negative for constipation, diarrhea, nausea and vomiting.  Endocrine: Negative for polydipsia and polyuria.  Genitourinary:  Negative for dysuria and hematuria.  Musculoskeletal:  Negative for neck pain and neck stiffness.  Skin:        Mole over right shoulder area  Neurological:  Negative for dizziness and weakness.  Psychiatric/Behavioral:  Negative for agitation and behavioral problems.        Objective:    Physical Exam Vitals reviewed.  Constitutional:      General: She is not in acute distress.    Appearance: She is obese. She is not diaphoretic.  HENT:     Head: Normocephalic and atraumatic.     Nose: Nose normal.     Mouth/Throat:     Mouth: Mucous membranes are moist.  Eyes:     General: No scleral icterus.    Extraocular Movements: Extraocular movements intact.  Cardiovascular:     Rate and Rhythm:  Normal rate and regular rhythm.     Pulses: Normal pulses.     Heart sounds: Normal heart sounds. No murmur heard. Pulmonary:     Breath sounds: Normal breath sounds. No wheezing or rales.  Abdominal:     Palpations: Abdomen is soft.     Tenderness: There is no abdominal tenderness.  Musculoskeletal:     Cervical back: Neck supple. No tenderness.     Right lower leg: No edema.     Left lower leg: No edema.  Skin:    General: Skin is warm.     Findings: No rash.     Comments: Brownish papule over right shoulder area -oval-shaped  Neurological:     General: No focal deficit present.     Mental Status: She is alert and oriented to person, place, and time.     Cranial Nerves: No cranial nerve deficit.     Sensory: No sensory deficit.     Motor: No weakness.  Psychiatric:        Mood and Affect: Mood normal.        Behavior: Behavior normal.     BP 112/78   Pulse 75   Ht '5\' 7"'  (1.702 m)   Wt 219 lb 12.8 oz (99.7 kg)   SpO2 99%   BMI 34.43 kg/m  Wt Readings from Last 3 Encounters:  09/06/21 219 lb 12.8 oz (99.7 kg)  05/30/21 234 lb 12.8 oz (106.5 kg)  03/01/21 241 lb (109.3 kg)    Lab Results  Component Value Date   TSH 1.067 01/30/2021   Lab Results  Component Value Date   WBC 6.9 01/30/2021   HGB 13.9 01/30/2021   HCT 42.5 01/30/2021   MCV 94.2 01/30/2021   PLT 204 01/30/2021   Lab Results  Component Value Date   NA 138 01/30/2021   K 4.1 01/30/2021   CO2 23 01/30/2021   GLUCOSE 111 (H) 01/30/2021   BUN 21 (H) 01/30/2021    CREATININE 1.01 (H) 01/30/2021   BILITOT 0.6 01/30/2021   ALKPHOS 53 01/30/2021   AST 27 01/30/2021   ALT 42 01/30/2021   PROT 7.5 01/30/2021   ALBUMIN 3.8 01/30/2021   CALCIUM 9.2 01/30/2021   ANIONGAP 7 01/30/2021   Lab Results  Component Value Date   CHOL 258 (H) 01/30/2021   Lab Results  Component Value Date   HDL 63 01/30/2021   Lab Results  Component Value Date   LDLCALC 158 (H) 01/30/2021   Lab Results  Component Value Date   TRIG 183 (H) 01/30/2021   Lab Results  Component Value Date   CHOLHDL 4.1 01/30/2021   Lab Results  Component Value Date   HGBA1C 5.8 (H) 01/30/2021      Assessment & Plan:   Problem List Items Addressed This Visit       Cardiovascular and Mediastinum   Hypertension    BP Readings from Last 1 Encounters:  09/06/21 112/78  Well-controlled now with diet alone DC Lisinopril-HCTZ Advised DASH diet and moderate exercise/walking, at least 150 mins/week      Relevant Orders   TSH     Other   Prediabetes    Lab Results  Component Value Date   HGBA1C 5.8 (H) 01/30/2021  Continue to follow low carb diet for now On Wegovy for obesity      Relevant Orders   Hemoglobin A1c   CMP14+EGFR   HLD (hyperlipidemia)   Relevant Orders   Lipid panel   Morbid  obesity ([place])    BMI Readings from Last 2 Encounters:  09/06/21 34.43 kg/m  05/30/21 36.77 kg/m   Has lost 15 lbs since last visit Has tried diet modification - low carb diet and regular exercise regimen She has obesity related complications - prediabetes, HLD, HTN and GERD On Wegovy, initial BMI - 37.75      Relevant Medications   Semaglutide-Weight Management (WEGOVY) 2.4 MG/0.75ML SOAJ   Encounter for general adult medical examination with abnormal findings - Primary    Physical exam as documented. Counseling done  re healthy lifestyle involving commitment to 150 minutes exercise per week, heart healthy diet, and attaining healthy weight.The importance of adequate sleep  also discussed. Changes in health habits are decided on by the patient with goals and time frames  set for achieving them. Immunization and cancer screening needs are specifically addressed at this visit.      Relevant Orders   TSH   CMP14+EGFR   CBC with Differential/Platelet   Other Visit Diagnoses     Vitamin D deficiency       Relevant Orders   VITAMIN D 25 Hydroxy (Vit-D Deficiency, Fractures)       Meds ordered this encounter  Medications   Semaglutide-Weight Management (WEGOVY) 2.4 MG/0.75ML SOAJ    Sig: Inject 2.4 mg into the skin every 7 (seven) days.    Dispense:  3 mL    Refill:  5    Follow-up: Return in about 6 months (around 03/09/2022) for Weight management.    Lindell Spar, MD

## 2021-09-06 NOTE — Assessment & Plan Note (Signed)
BP Readings from Last 1 Encounters:  09/06/21 112/78   Well-controlled now with diet alone DC Lisinopril-HCTZ Advised DASH diet and moderate exercise/walking, at least 150 mins/week

## 2021-09-06 NOTE — Assessment & Plan Note (Signed)

## 2021-09-06 NOTE — Patient Instructions (Signed)
Please continue taking Wegovy as prescribed.  Please continue to follow low carb diet and perform moderate exercise/walking at least 150 mins/week.

## 2021-09-06 NOTE — Assessment & Plan Note (Addendum)
BMI Readings from Last 2 Encounters:  09/06/21 34.43 kg/m  05/30/21 36.77 kg/m    Has lost 15 lbs since last visit Has tried diet modification - low carb diet and regular exercise regimen She has obesity related complications - prediabetes, HLD, HTN and GERD On Wegovy, initial BMI - 37.75

## 2021-09-06 NOTE — Assessment & Plan Note (Signed)
Lab Results  Component Value Date   HGBA1C 5.8 (H) 01/30/2021   Continue to follow low carb diet for now On Wegovy for obesity

## 2021-09-07 ENCOUNTER — Telehealth: Payer: Self-pay | Admitting: Internal Medicine

## 2021-09-07 LAB — LIPID PANEL
Chol/HDL Ratio: 4.1 ratio (ref 0.0–4.4)
Cholesterol, Total: 215 mg/dL — ABNORMAL HIGH (ref 100–199)
HDL: 53 mg/dL (ref >39)
LDL Chol Calc (NIH): 146 mg/dL — ABNORMAL HIGH (ref 0–99)
Triglycerides: 91 mg/dL (ref 0–149)
VLDL Cholesterol Cal: 16 mg/dL (ref 5–40)

## 2021-09-07 LAB — CBC WITH DIFFERENTIAL/PLATELET
Basophils Absolute: 0 10*3/uL (ref 0.0–0.2)
Basos: 0 %
EOS (ABSOLUTE): 0 10*3/uL (ref 0.0–0.4)
Eos: 0 %
Hematocrit: 45.9 % (ref 34.0–46.6)
Hemoglobin: 15.5 g/dL (ref 11.1–15.9)
Immature Grans (Abs): 0 10*3/uL (ref 0.0–0.1)
Immature Granulocytes: 0 %
Lymphocytes Absolute: 1.7 10*3/uL (ref 0.7–3.1)
Lymphs: 33 %
MCH: 30.8 pg (ref 26.6–33.0)
MCHC: 33.8 g/dL (ref 31.5–35.7)
MCV: 91 fL (ref 79–97)
Monocytes Absolute: 0.4 10*3/uL (ref 0.1–0.9)
Monocytes: 8 %
Neutrophils Absolute: 3 10*3/uL (ref 1.4–7.0)
Neutrophils: 59 %
Platelets: 218 10*3/uL (ref 150–450)
RBC: 5.03 x10E6/uL (ref 3.77–5.28)
RDW: 12.5 % (ref 11.7–15.4)
WBC: 5.2 10*3/uL (ref 3.4–10.8)

## 2021-09-07 LAB — CMP14+EGFR
ALT: 28 IU/L (ref 0–32)
AST: 22 IU/L (ref 0–40)
Albumin/Globulin Ratio: 1.4 (ref 1.2–2.2)
Albumin: 4.1 g/dL (ref 3.9–4.9)
Alkaline Phosphatase: 70 IU/L (ref 44–121)
BUN/Creatinine Ratio: 11 (ref 9–23)
BUN: 11 mg/dL (ref 6–24)
Bilirubin Total: 0.5 mg/dL (ref 0.0–1.2)
CO2: 21 mmol/L (ref 20–29)
Calcium: 9.5 mg/dL (ref 8.7–10.2)
Chloride: 102 mmol/L (ref 96–106)
Creatinine, Ser: 0.97 mg/dL (ref 0.57–1.00)
Globulin, Total: 3 g/dL (ref 1.5–4.5)
Glucose: 92 mg/dL (ref 70–99)
Potassium: 5.2 mmol/L (ref 3.5–5.2)
Sodium: 137 mmol/L (ref 134–144)
Total Protein: 7.1 g/dL (ref 6.0–8.5)
eGFR: 71 mL/min/{1.73_m2} (ref >59)

## 2021-09-07 LAB — VITAMIN D 25 HYDROXY (VIT D DEFICIENCY, FRACTURES): Vit D, 25-Hydroxy: 43.7 ng/mL (ref 30.0–100.0)

## 2021-09-07 LAB — TSH: TSH: 1.24 u[IU]/mL (ref 0.450–4.500)

## 2021-09-07 LAB — HEMOGLOBIN A1C
Est. average glucose Bld gHb Est-mCnc: 97 mg/dL
Hgb A1c MFr Bld: 5 % (ref 4.8–5.6)

## 2021-09-07 NOTE — Telephone Encounter (Signed)
Returning call.

## 2021-09-07 NOTE — Telephone Encounter (Signed)
Patient advised with verbal understanding  

## 2021-10-26 ENCOUNTER — Other Ambulatory Visit (HOSPITAL_COMMUNITY): Payer: Self-pay

## 2021-11-06 ENCOUNTER — Emergency Department (HOSPITAL_COMMUNITY): Payer: 59

## 2021-11-06 ENCOUNTER — Other Ambulatory Visit: Payer: Self-pay

## 2021-11-06 ENCOUNTER — Encounter: Payer: Self-pay | Admitting: Internal Medicine

## 2021-11-06 ENCOUNTER — Encounter (HOSPITAL_COMMUNITY): Payer: Self-pay

## 2021-11-06 ENCOUNTER — Emergency Department (HOSPITAL_COMMUNITY)
Admission: EM | Admit: 2021-11-06 | Discharge: 2021-11-06 | Disposition: A | Discharge status: Home / Self Care | Payer: 59 | Attending: Emergency Medicine | Admitting: Emergency Medicine

## 2021-11-06 DIAGNOSIS — I10 Essential (primary) hypertension: Secondary | ICD-10-CM | POA: Diagnosis not present

## 2021-11-06 DIAGNOSIS — R112 Nausea with vomiting, unspecified: Secondary | ICD-10-CM | POA: Diagnosis not present

## 2021-11-06 DIAGNOSIS — R109 Unspecified abdominal pain: Secondary | ICD-10-CM | POA: Diagnosis not present

## 2021-11-06 DIAGNOSIS — D72829 Elevated white blood cell count, unspecified: Secondary | ICD-10-CM | POA: Insufficient documentation

## 2021-11-06 DIAGNOSIS — R1110 Vomiting, unspecified: Secondary | ICD-10-CM | POA: Diagnosis not present

## 2021-11-06 DIAGNOSIS — R1013 Epigastric pain: Secondary | ICD-10-CM | POA: Insufficient documentation

## 2021-11-06 DIAGNOSIS — Z853 Personal history of malignant neoplasm of breast: Secondary | ICD-10-CM | POA: Diagnosis not present

## 2021-11-06 DIAGNOSIS — K8020 Calculus of gallbladder without cholecystitis without obstruction: Secondary | ICD-10-CM | POA: Diagnosis not present

## 2021-11-06 DIAGNOSIS — K760 Fatty (change of) liver, not elsewhere classified: Secondary | ICD-10-CM | POA: Diagnosis not present

## 2021-11-06 LAB — URINALYSIS, ROUTINE W REFLEX MICROSCOPIC
Bilirubin Urine: NEGATIVE
Glucose, UA: NEGATIVE mg/dL
Hgb urine dipstick: NEGATIVE
Ketones, ur: NEGATIVE mg/dL
Leukocytes,Ua: NEGATIVE
Nitrite: NEGATIVE
Protein, ur: NEGATIVE mg/dL
Specific Gravity, Urine: 1.012 (ref 1.005–1.030)
pH: 6 (ref 5.0–8.0)

## 2021-11-06 LAB — CBC
HCT: 46.2 % — ABNORMAL HIGH (ref 36.0–46.0)
Hemoglobin: 15.3 g/dL — ABNORMAL HIGH (ref 12.0–15.0)
MCH: 30.7 pg (ref 26.0–34.0)
MCHC: 33.1 g/dL (ref 30.0–36.0)
MCV: 92.6 fL (ref 80.0–100.0)
Platelets: 201 10*3/uL (ref 150–400)
RBC: 4.99 MIL/uL (ref 3.87–5.11)
RDW: 12.2 % (ref 11.5–15.5)
WBC: 11.5 10*3/uL — ABNORMAL HIGH (ref 4.0–10.5)
nRBC: 0 % (ref 0.0–0.2)

## 2021-11-06 LAB — COMPREHENSIVE METABOLIC PANEL
ALT: 78 U/L — ABNORMAL HIGH (ref 0–44)
AST: 179 U/L — ABNORMAL HIGH (ref 15–41)
Albumin: 3.9 g/dL (ref 3.5–5.0)
Alkaline Phosphatase: 122 U/L (ref 38–126)
Anion gap: 9 (ref 5–15)
BUN: 13 mg/dL (ref 6–20)
CO2: 23 mmol/L (ref 22–32)
Calcium: 8.9 mg/dL (ref 8.9–10.3)
Chloride: 106 mmol/L (ref 98–111)
Creatinine, Ser: 1.02 mg/dL — ABNORMAL HIGH (ref 0.44–1.00)
GFR, Estimated: >60 mL/min (ref >60)
Glucose, Bld: 100 mg/dL — ABNORMAL HIGH (ref 70–99)
Potassium: 4.5 mmol/L (ref 3.5–5.1)
Sodium: 138 mmol/L (ref 135–145)
Total Bilirubin: 1.1 mg/dL (ref 0.3–1.2)
Total Protein: 7.7 g/dL (ref 6.5–8.1)

## 2021-11-06 LAB — POC URINE PREG, ED: Preg Test, Ur: NEGATIVE

## 2021-11-06 LAB — LIPASE, BLOOD: Lipase: 61 U/L — ABNORMAL HIGH (ref 11–51)

## 2021-11-06 MED ORDER — LIDOCAINE VISCOUS HCL 2 % MT SOLN
15.0000 mL | Freq: Once | OROMUCOSAL | Status: AC
  Administered 2021-11-06: 15 mL via ORAL
  Filled 2021-11-06: qty 15

## 2021-11-06 MED ORDER — ALUM & MAG HYDROXIDE-SIMETH 200-200-20 MG/5ML PO SUSP
15.0000 mL | Freq: Once | ORAL | Status: DC
  Filled 2021-11-06: qty 30

## 2021-11-06 MED ORDER — SODIUM CHLORIDE 0.9 % IV BOLUS
500.0000 mL | Freq: Once | INTRAVENOUS | Status: AC
  Administered 2021-11-06: 500 mL via INTRAVENOUS

## 2021-11-06 MED ORDER — HYDROCODONE-ACETAMINOPHEN 5-325 MG PO TABS: 2.0000 | ORAL_TABLET | ORAL | 0 refills | Status: DC | PRN

## 2021-11-06 MED ORDER — FAMOTIDINE 40 MG PO TABS: 40.0000 mg | ORAL_TABLET | Freq: Every day | ORAL | 0 refills | Status: DC

## 2021-11-06 MED ORDER — ONDANSETRON 8 MG PO TBDP
8.0000 mg | ORAL_TABLET | Freq: Once | ORAL | Status: AC
  Administered 2021-11-06: 8 mg via ORAL
  Filled 2021-11-06: qty 1

## 2021-11-06 MED ORDER — ONDANSETRON HCL 4 MG PO TABS: 4.0000 mg | ORAL_TABLET | Freq: Four times a day (QID) | ORAL | 0 refills | Status: DC

## 2021-11-06 MED ORDER — FAMOTIDINE IN NACL 20-0.9 MG/50ML-% IV SOLN
20.0000 mg | Freq: Once | INTRAVENOUS | Status: AC
  Administered 2021-11-06: 20 mg via INTRAVENOUS
  Filled 2021-11-06: qty 50

## 2021-11-06 MED ORDER — ALUM & MAG HYDROXIDE-SIMETH 200-200-20 MG/5ML PO SUSP
30.0000 mL | Freq: Once | ORAL | Status: AC
  Administered 2021-11-06: 30 mL via ORAL
  Filled 2021-11-06: qty 30

## 2021-11-06 MED ORDER — OXYCODONE-ACETAMINOPHEN 5-325 MG PO TABS
1.0000 | ORAL_TABLET | Freq: Once | ORAL | Status: AC
  Administered 2021-11-06: 1 via ORAL
  Filled 2021-11-06: qty 1

## 2021-11-06 MED ORDER — IOHEXOL 300 MG/ML  SOLN
100.0000 mL | Freq: Once | INTRAMUSCULAR | Status: AC | PRN
  Administered 2021-11-06: 100 mL via INTRAVENOUS

## 2021-11-06 NOTE — ED Notes (Signed)
Patient transported to CT 

## 2021-11-06 NOTE — ED Triage Notes (Signed)
Pt presents to ED with complaints of epigastric pain since last night. Pt states she vomited x 1 this am. Pt states feels like a burning sensation.

## 2021-11-06 NOTE — ED Notes (Signed)
Ultrasound at bedside

## 2021-11-06 NOTE — Discharge Instructions (Addendum)
Note your work-up today was overall reassuring.  Your CT scan as well as your right upper quadrant ultrasound were negative for any acute abnormalities.  Your lipase was slightly elevated this could be due to mild inflammation/up on the CT scan yet.  We will treat this with GI cocktail as we discussed with Maalox, Pepcid.  Your symptoms could also be related to irritation of the lining of your stomach.  Continue to drink plenty of fluids.  Avoid alcohol.  Recommend close follow-up with your PCP in 2 to 3 days for reevaluation of your symptoms.  Please do not hesitate to return to the emergency department the worrisome signs and symptoms we discussed become apparent.

## 2021-11-06 NOTE — ED Provider Notes (Signed)
[facility] EMERGENCY DEPARTMENT Provider Note   CSN: [id number] Arrival date & time: 11/06/21  0258     History  Chief Complaint  Patient presents with   Abdominal Pain    [name] is a 51 y.o. female.   Abdominal Pain   51 year old female presents emergency department with complaints of abdominal pain.  Patient states that abdominal pain began in the epigastric area after eating dinner consuming 2 glasses of wine last night.  She describes the pain as "burning with sharp pains radiating to her left scapular area.  Denies associated shortness of breath or chest pain.  She states that she tried to eat something this morning and pain became "severe."  She had 1 episode of emesis which then prompted her visit to the emergency department.  Denies feeling similar symptoms prior, prior abdominal surgeries.  Denies fever, chills, night sweats, chest pain, shortness of breath, diarrhea, urinary/vaginal symptoms, change in bowel habits.  Patient states she is also on Wegovy but has not had increase in dosage over the past several months.  Past medical history significant for breast cancer, gastroparesis, GERD, hypertension  Home Medications Prior to Admission medications   Medication Sig Start Date End Date Taking? Authorizing Provider  famotidine (PEPCID) 40 MG tablet Take 1 tablet (40 mg total) by mouth daily. 11/06/21  Yes Dion Saucier A, PA  HYDROcodone-acetaminophen (NORCO/VICODIN) 5-325 MG tablet Take 2 tablets by mouth every 4 (four) hours as needed. 11/06/21  Yes Dion Saucier A, PA  ondansetron (ZOFRAN) 4 MG tablet Take 1 tablet (4 mg total) by mouth every 6 (six) hours. 11/06/21  Yes Wilnette Kales, PA  Semaglutide-Weight Management (WEGOVY) 2.4 MG/0.75ML SOAJ Inject 2.4 mg into the skin every 7 (seven) days. 09/06/21  Yes Lindell Spar, MD  omeprazole (PRILOSEC) 20 MG capsule Take 1 capsule (20 mg total) by mouth daily. Patient not taking: Reported on 11/06/2021 03/01/21    Lindell Spar, MD      Allergies    Augmentin [amoxicillin-pot clavulanate]    Review of Systems   Review of Systems  Gastrointestinal:  Positive for abdominal pain.  All other systems reviewed and are negative.   Physical Exam Updated Vital Signs BP 112/76   Pulse 65   Temp 97.8 F (36.6 C) (Oral)   Resp 16   Ht '5\' 7"'$  (1.702 m)   Wt 94.3 kg   SpO2 99%   BMI 32.58 kg/m  Physical Exam Vitals and nursing note reviewed.  Constitutional:      General: She is not in acute distress.    Appearance: She is well-developed.  HENT:     Head: Normocephalic and atraumatic.  Eyes:     Conjunctiva/sclera: Conjunctivae normal.  Cardiovascular:     Rate and Rhythm: Normal rate and regular rhythm.     Heart sounds: No murmur heard. Pulmonary:     Effort: Pulmonary effort is normal. No respiratory distress.     Breath sounds: Normal breath sounds.  Abdominal:     Palpations: Abdomen is soft.     Tenderness: There is abdominal tenderness in the epigastric area. There is no right CVA tenderness or left CVA tenderness.     Comments: Mild epigastric tenderness to palpation.  Musculoskeletal:        General: No swelling.     Cervical back: Neck supple.  Skin:    General: Skin is warm and dry.     Capillary Refill: Capillary refill takes less than 2  seconds.  Neurological:     Mental Status: She is alert.  Psychiatric:        Mood and Affect: Mood normal.     ED Results / Procedures / Treatments   Labs (all labs ordered are listed, but only abnormal results are displayed) Labs Reviewed  LIPASE, BLOOD - Abnormal; Notable for the following components:      Result Value   Lipase 61 (*)    All other components within normal limits  COMPREHENSIVE METABOLIC PANEL - Abnormal; Notable for the following components:   Glucose, Bld 100 (*)    Creatinine, Ser 1.02 (*)    AST 179 (*)    ALT 78 (*)    All other components within normal limits  CBC - Abnormal; Notable for the  following components:   WBC 11.5 (*)    Hemoglobin 15.3 (*)    HCT 46.2 (*)    All other components within normal limits  URINALYSIS, ROUTINE W REFLEX MICROSCOPIC - Abnormal; Notable for the following components:   APPearance HAZY (*)    All other components within normal limits  POC URINE PREG, ED    EKG EKG Interpretation  Date/Time:  Tuesday November 06 2021 10:29:18 EDT Ventricular Rate:  77 PR Interval:  151 QRS Duration: 89 QT Interval:  396 QTC Calculation: 449 R Axis:   60 Text Interpretation: Sinus rhythm Low voltage, precordial leads RSR' in V1 or V2, probably normal variant No previous ECGs available Confirmed by Fredia Sorrow [phone]) on 11/06/2021 10:31:09 AM  Radiology US Abdomen Limited RUQ (LIVER/GB)  Result Date: 11/06/2021 CLINICAL DATA:  Cholelithiasis on CT.  Nausea vomiting for 1 day. EXAM: ULTRASOUND ABDOMEN LIMITED RIGHT UPPER QUADRANT COMPARISON:  CT of the abdomen pelvis from September 26th 2023. FINDINGS: Gallbladder: No gallbladder wall thickening visualized. No sonographic Murphy sign noted by sonographer. 1.8 cm mobile calculus. Common bile duct: Diameter: 2.7 mm Liver: No focal lesion identified. Mildly increased parenchymal echogenicity. Portal vein is patent on color Doppler imaging with normal direction of blood flow towards the liver. Other: None. IMPRESSION: Cholelithiasis without sonographic evidence of acute cholecystitis. Hepatic steatosis. Electronically Signed   By: Fidela Salisbury M.D.   On: 11/06/2021 14:20   CT Abdomen Pelvis W Contrast  Result Date: 11/06/2021 CLINICAL DATA:  Epigastric pain with nausea and vomiting for the past day. EXAM: CT ABDOMEN AND PELVIS WITH CONTRAST TECHNIQUE: Multidetector CT imaging of the abdomen and pelvis was performed using the standard protocol following bolus administration of intravenous contrast. RADIATION DOSE REDUCTION: This exam was performed according to the departmental dose-optimization program  which includes automated exposure control, adjustment of the mA and/or kV according to patient size and/or use of iterative reconstruction technique. CONTRAST:  178m OMNIPAQUE IOHEXOL 300 MG/ML  SOLN COMPARISON:  CT abdomen pelvis dated July 29, 2007. FINDINGS: Lower chest: No acute abnormality. Hepatobiliary: No focal liver abnormality. New 1.5 cm gallstone. No gallbladder wall thickening or biliary dilatation. Pancreas: Unremarkable. No pancreatic ductal dilatation or surrounding inflammatory changes. Spleen: Normal in size without focal abnormality. Adrenals/Urinary Tract: Adrenal glands are unremarkable. Kidneys are normal, without renal calculi, focal lesion, or hydronephrosis. Bladder is unremarkable. Stomach/Bowel: Stomach is within normal limits. Appendix appears normal. No evidence of bowel wall thickening, distention, or inflammatory changes. Vascular/Lymphatic: No significant vascular findings are present. No enlarged abdominal or pelvic lymph nodes. Reproductive: Uterus and bilateral adnexa are unremarkable. Other: No abdominal wall hernia or abnormality. No abdominopelvic ascites. No pneumoperitoneum. Musculoskeletal: No acute or significant  osseous findings. IMPRESSION: 1. No acute intra-abdominal process. 2. Cholelithiasis. Electronically Signed   By: Titus Dubin M.D.   On: 11/06/2021 12:42    Procedures Procedures    Medications Ordered in ED Medications  sodium chloride 0.9 % bolus 500 mL (0 mLs Intravenous Stopped 11/06/21 1155)  famotidine (PEPCID) IVPB 20 mg premix (0 mg Intravenous Stopped 11/06/21 1156)  oxyCODONE-acetaminophen (PERCOCET/ROXICET) 5-325 MG per tablet 1 tablet (1 tablet Oral Given 11/06/21 1052)  ondansetron (ZOFRAN-ODT) disintegrating tablet 8 mg (8 mg Oral Given 11/06/21 1049)  alum & mag hydroxide-simeth (MAALOX/MYLANTA) 200-200-20 MG/5ML suspension 30 mL (30 mLs Oral Given 11/06/21 1205)    And  lidocaine (XYLOCAINE) 2 % viscous mouth solution 15 mL (15 mLs Oral  Given 11/06/21 1205)  iohexol (OMNIPAQUE) 300 MG/ML solution 100 mL (100 mLs Intravenous Contrast Given 11/06/21 1233)    ED Course/ Medical Decision Making/ A&P                           Medical Decision Making Amount and/or Complexity of Data Reviewed Labs: ordered. Radiology: ordered.  Risk OTC drugs. Prescription drug management.   This patient presents to the ED for concern of abdominal pain, this involves an extensive number of treatment options, and is a complaint that carries with it a high risk of complications and morbidity.  The differential diagnosis includes The causes of generalized abdominal pain include but are not limited to AAA, mesenteric ischemia, appendicitis, diverticulitis, DKA, gastritis, gastroenteritis, AMI, nephrolithiasis, pancreatitis, peritonitis, intestinal ischemia, constipation, UTI,SBO/LBO, splenic rupture, biliary disease, IBD, IBS, PUD, or hepatitis. ovarian torsion, PID.   Co morbidities that complicate the patient evaluation  See HPI   Additional history obtained:  Additional history obtained from EMR   Lab Tests:  I Ordered, and personally interpreted labs.  The pertinent results include: Mild leukocytosis of 11.5.  No evidence of anemia.  Platelets within range.  Lipase slightly elevated of 61 with AST of 179 and ALT of 78.  Otherwise, electrolytes within normal range.  Renal function within normal limits.   Imaging Studies ordered:  I ordered imaging studies including CT abdomen pelvis, right upper quadrant ultrasound I independently visualized and interpreted imaging which showed  CT abdomen pelvis: Cholelithiasis without evidence of any acute process in the abdomen Right upper quadrant ultrasound: Cholelithiasis without evidence of cholecystitis.  Hepatic steatosis I agree with the radiologist interpretation   Cardiac Monitoring: / EKG:  The patient was maintained on a cardiac monitor.  I personally viewed and interpreted the  cardiac monitored which showed an underlying rhythm of: Sinus rhythm   Consultations Obtained:  N/a   Problem List / ED Course / Critical interventions / Medication management  Epigastric abdominal pain I ordered medication including Percocet for pain.  Famotidine, Xylocaine and Maalox for GI cocktail.  Zofran for nausea   Reevaluation of the patient after these medicines showed that the patient improved I have reviewed the patients home medicines and have made adjustments as needed   Social Determinants of Health:  Former cigarette use.  Denies illicit drug use.   Test / Admission - Considered:  Epigastric abdominal pain Vitals signs ithin normal range and stable throughout visit. Laboratory/imaging studies significant for: See above Unsure exact etiology of patient's epigastric abdominal pain.  Symptoms could be secondary to gastritis or PUD given significant improvement with administration of GI cocktail.  Patient states that it is difficult to ascertain whether or not that was the  pain medicine masking epigastric pain with administration of GI cocktail began.  Symptoms could be related to early onset pancreatitis given mild elevation in lipase today as well as mild leukocytosis likely reactive to pain.  No acute abdominal process identified in right upper quadrant ultrasound or CT abdomen pelvis.  Patient to be treated symptomatically for both etiologies.  Patient prescribed famotidine and recommended to take Maalox over-the-counter.  Zofran prescribed to take as needed for nausea.  Close follow-up with PCP recommended to 3 days.  Treatment plan discussed with patient she contracts and was agreeable to said plan. Worrisome signs and symptoms were discussed with the patient, and the patient acknowledged understanding to return to the ED if noticed. Patient was stable upon discharge.          Final Clinical Impression(s) / ED Diagnoses Final diagnoses:  Epigastric pain     Rx / DC Orders ED Discharge Orders          Ordered    famotidine (PEPCID) 40 MG tablet  Daily        11/06/21 1551    ondansetron (ZOFRAN) 4 MG tablet  Every 6 hours        11/06/21 1556                   Wilnette Kales, [place] 11/06/21 1559    Fredia Sorrow, MD 11/09/21 [phone]

## 2021-11-06 NOTE — ED Notes (Addendum)
Pt given a snack and water for P/O challenge

## 2021-11-06 NOTE — ED Notes (Addendum)
Pt states, after GI medications administered, pain/heartburn has completely disappeared and she feels great. Pt was able to tolerate snack and fluids well, denies N/V

## 2021-11-13 ENCOUNTER — Encounter: Payer: Self-pay | Admitting: Gastroenterology

## 2021-11-13 ENCOUNTER — Ambulatory Visit: Payer: 59 | Admitting: Gastroenterology

## 2021-11-13 ENCOUNTER — Other Ambulatory Visit (HOSPITAL_COMMUNITY): Payer: Self-pay

## 2021-11-13 VITALS — BP 125/89 | HR 76 | Temp 98.4°F | Ht 67.0 in | Wt 217.6 lb

## 2021-11-13 DIAGNOSIS — R1310 Dysphagia, unspecified: Secondary | ICD-10-CM

## 2021-11-13 DIAGNOSIS — Z1211 Encounter for screening for malignant neoplasm of colon: Secondary | ICD-10-CM

## 2021-11-13 DIAGNOSIS — K760 Fatty (change of) liver, not elsewhere classified: Secondary | ICD-10-CM | POA: Diagnosis not present

## 2021-11-13 DIAGNOSIS — K219 Gastro-esophageal reflux disease without esophagitis: Secondary | ICD-10-CM

## 2021-11-13 MED ORDER — PANTOPRAZOLE SODIUM 40 MG PO TBEC
40.0000 mg | DELAYED_RELEASE_TABLET | Freq: Every day | ORAL | 3 refills | Status: DC
  Filled 2021-11-13: qty 90, 90d supply, fill #0

## 2021-11-13 NOTE — Patient Instructions (Signed)
I have sent in pantoprazole (Protonix) to take once each day, 30 minutes before breakfast. It can take up to 14 days to have full effect. You can take Pepcid as needed for breakthrough reflux.  We are arranging a colonoscopy, upper endoscopy, and dilation with Dr. Gala Romney in the near future.  You will need to stop Wegovy 7 days prior to the procedure.   Further recommendations to follow!  It was a pleasure to see you today. I want to create trusting relationships with patients to provide genuine, compassionate, and quality care. I value your feedback. If you receive a survey regarding your visit,  I greatly appreciate you taking time to fill this out.   Annitta Needs, PhD, ANP-BC [facility]

## 2021-11-13 NOTE — Progress Notes (Unsigned)
Gastroenterology Office Note    Referring Provider: Lindell Spar, MD Primary Care Physician:  Lindell Spar, MD  Primary GI: Dr. Abbey Chatters    Chief Complaint   Chief Complaint  Patient presents with   er followup    Was seen in the ER for abdominal, states that it pretty much went away shortly after she got there.     History of Present Illness   [name] is a delightful 51 y.o. female presenting today at the request of Lindell Spar, MD due to abdominal pain.   Presented to the ED with new onset abdominal pain, worsening, and underwent CT abd/pelvis with contrast 11/06/2021 in ED: no acute findings. +cholelithiasis. US abdomen limited: cholelithiasis without acute cholecystitis. Hepatic steatosis. Newly elevated AST/ALT at 179 and 78, respectively. Lipase 61.    Started [facility] in Feb 2022. History of gastroparesis. Ate bowl of chili last Monday for lunch then dinner ate roast, few glasses of wine. 1 am had abdominal pain felt like trapped gas. Epigastric and LUQ. Took gas x and had marginal improvement. Drank apple cider vinegar, which burned. Drank lots of water. Then started to improve. Had 1 tbsp [place], with recurrence of severe pain. Went to ED. Vomited in ED and felt better. Notes history of chronic GERD. OTC meds. Solid food dysphagia intermittently with chicken a few times recently. +NSAIDs. Taking ibuprofen for joint pain. No aspirin powders. NO lower GI issues. No prior colonoscopy.    EGD in 2009: normal esophagus, large gastric diverticulum, patent pylorus.   Past Medical History:  Diagnosis Date   Breast cancer ([place]) [phone]   rt breast   Breast disorder    cancer right breast   Cancer ([place])    right lobular carcinoma in situ   Gastroparesis    controlled    Past Surgical History:  Procedure Laterality Date   BACK SURGERY  10/2002   lumbar hemilaminectomy, microdiscectomy L5-S1   BREAST BIOPSY Right 07/2016   malignant; waiting on MRI  results   BREAST LUMPECTOMY Right 2012,2018   BREAST LUMPECTOMY WITH RADIOACTIVE SEED LOCALIZATION Right 10/08/2016   Procedure: RIGHT BREAST LUMPECTOMY WITH RADIOACTIVE SEED LOCALIZATION;  Surgeon: Fanny Skates, MD;  Location: [facility];  Service: General;  Laterality: Right;   BREAST SURGERY     ESOPHAGOGASTRODUODENOSCOPY  08/2007   normal esophagus, large gastric diverticulum, patent pylorus   TUBAL LIGATION  2001    Current Outpatient Medications  Medication Sig Dispense Refill   famotidine (PEPCID) 40 MG tablet Take 1 tablet (40 mg total) by mouth daily. 30 tablet 0   pantoprazole (PROTONIX) 40 MG tablet Take 1 tablet (40 mg total) by mouth daily 30 minutes before breakfast. 90 tablet 3   Semaglutide-Weight Management (WEGOVY) 2.4 MG/0.75ML SOAJ Inject 2.4 mg into the skin every 7 (seven) days. 3 mL 5   No current facility-administered medications for this visit.    Allergies as of 11/13/2021 - Review Complete 11/13/2021  Allergen Reaction Noted   Augmentin [amoxicillin-pot clavulanate] Hives 12/03/2010    Family History  Problem Relation Age of Onset   Hypertension Mother    Hyperlipidemia Mother    Diverticulitis Mother    Hernia Mother    Heart disease Father        arrythmia   Hypertension Father    Arthritis Brother    Heart attack Maternal Grandmother    Aneurysm Maternal Grandfather    Congestive Heart Failure Paternal Grandmother  Cancer Maternal Uncle        pancreatic   Colon cancer Neg Hx    Colon polyps Neg Hx     Social History   Socioeconomic History   Marital status: Married    Spouse name: Not on file   Number of children: Not on file   Years of education: Not on file   Highest education level: Not on file  Occupational History   Not on file  Tobacco Use   Smoking status: Former    Packs/day: 1.00    Years: 16.00    Total pack years: 16.00    Types: Cigarettes    Quit date: 08/12/2000    Years since quitting: 21.2    Smokeless tobacco: Never   Tobacco comments:    quit 10 yrs  Vaping Use   Vaping Use: Never used  Substance and Sexual Activity   Alcohol use: Yes    Comment: occasionally   Drug use: No   Sexual activity: Yes    Birth control/protection: Surgical    Comment: tubal  Other Topics Concern   Not on file  Social History Narrative   Not on file   Social Determinants of Health   Financial Resource Strain: Low Risk  (09/08/2020)   Overall Financial Resource Strain (CARDIA)    Difficulty of Paying Living Expenses: Not hard at all  Food Insecurity: No Food Insecurity (09/08/2020)   Hunger Vital Sign    Worried About Running Out of Food in the Last Year: Never true    [place] in the Last Year: Never true  Transportation Needs: No Transportation Needs (09/08/2020)   PRAPARE - Hydrologist (Medical): No    Lack of Transportation (Non-Medical): No  Physical Activity: Insufficiently Active (09/08/2020)   Exercise Vital Sign    Days of Exercise per Week: 1 day    Minutes of Exercise per Session: 30 min  Stress: No Stress Concern Present (09/08/2020)   [place]    Feeling of Stress : Only a little  Social Connections: Moderately Integrated (09/08/2020)   Social Connection and Isolation Panel [NHANES]    Frequency of Communication with Friends and Family: More than three times a week    Frequency of Social Gatherings with Friends and Family: Three times a week    Attends Religious Services: Never    Active Member of Clubs or Organizations: Yes    Attends Archivist Meetings: More than 4 times per year    Marital Status: Married  Human resources officer Violence: Not At Risk (09/08/2020)   Humiliation, Afraid, Rape, and Kick questionnaire    Fear of Current or Ex-Partner: No    Emotionally Abused: No    Physically Abused: No    Sexually Abused: No     Review of Systems   Gen:  Denies any fever, chills, fatigue, weight loss, lack of appetite.  CV: Denies chest pain, heart palpitations, peripheral edema, syncope.  Resp: Denies shortness of breath at rest or with exertion. Denies wheezing or cough.  GI: see HPI GU : Denies urinary burning, urinary frequency, urinary hesitancy MS: Denies joint pain, muscle weakness, cramps, or limitation of movement.  Derm: Denies rash, itching, dry skin Psych: Denies depression, anxiety, memory loss, and confusion Heme: Denies bruising, bleeding, and enlarged lymph nodes.   Physical Exam   BP 125/89 (BP Location: Right Arm, Patient Position: Sitting, Cuff Size: Normal)  Pulse 76   Temp 98.4 F (36.9 C) (Oral)   Ht '5\' 7"'$  (1.702 m)   Wt 217 lb 9.6 oz (98.7 kg)   LMP 10/11/2021 (Approximate)   SpO2 98%   BMI 34.08 kg/m  General:   Alert and oriented. Pleasant and cooperative. Well-nourished and well-developed.  Head:  Normocephalic and atraumatic. Eyes:  Without icterus Ears:  Normal auditory acuity. Lungs:  Clear to auscultation bilaterally.  Heart:  S1, S2 present without murmurs appreciated.  Abdomen:  +BS, soft, non-tender and non-distended. No HSM noted. No guarding or rebound. No masses appreciated.  Rectal:  Deferred  Msk:  Symmetrical without gross deformities. Normal posture. Extremities:  Without edema. Neurologic:  Alert and  oriented x4;  grossly normal neurologically. Skin:  Intact without significant lesions or rashes. Psych:  Alert and cooperative. Normal mood and affect.   Assessment   [name] is a delightful 51 y.o. female presenting today at the request of Lindell Spar, MD due to abdominal pain. Remote history of delayed gastric emptying on GES many years ago but has had quiescent symptoms.   New onset abdominal pain noted last week with ED presentation as noted above. CT unrevealing other than cholelithiasis, and [place] with cholelithiasis and fatty liver. She did have newly elevated AST/ALT at  179 and 78, respectively. No pancreatitis. Suspected acutely elevated transaminases more of a bystander reaction in setting of acute illness. Symptoms not typical of biliary pain as located more epigastric and LUQ. She has had resolution of pain. Does endorse NSAID use and chronic GERD. Doubt Wegovy playing a role.   Notes intermittent dysphagia to solid foods as well. Last EGD in 2009 with normal esophagus, large gastric diverticulum, and patent pylorus.   She is also due for routine screening colonoscopy. No concerning lower GI signs/symptoms.      PLAN   Start pantoprazole once daily  Proceed with colonoscopy/EGD/dilation by Dr. Abbey Chatters  in near future: the risks, benefits, and alternatives have been discussed with the patient in detail. The patient states understanding and desires to proceed.   Recheck HFP  Hold Wegovy one week prior   Monitor for any biliary-type pain and would pursue HIDA  Annitta Needs, PhD, ANP-BC [facility]

## 2021-11-14 ENCOUNTER — Telehealth: Payer: Self-pay | Admitting: *Deleted

## 2021-11-14 ENCOUNTER — Encounter: Payer: Self-pay | Admitting: Gastroenterology

## 2021-11-14 NOTE — Telephone Encounter (Signed)
LMTRC to schedule TCS/EGD/ED ASA 2 with Dr.Carver

## 2021-11-16 ENCOUNTER — Other Ambulatory Visit (HOSPITAL_COMMUNITY): Payer: Self-pay

## 2021-11-16 ENCOUNTER — Encounter: Payer: Self-pay | Admitting: *Deleted

## 2021-11-16 DIAGNOSIS — Z1211 Encounter for screening for malignant neoplasm of colon: Secondary | ICD-10-CM

## 2021-11-16 MED ORDER — CLENPIQ 10-3.5-12 MG-GM -GM/175ML PO SOLN
1.0000 | ORAL | 0 refills | Status: DC
  Filled 2021-11-16: qty 350, 1d supply, fill #0

## 2021-11-16 NOTE — Telephone Encounter (Signed)
Pt has been scheduled for 12/14/21 at 8am. Instructions will be mail and prep sent to the pharmacy

## 2021-11-19 ENCOUNTER — Other Ambulatory Visit: Payer: Self-pay | Admitting: *Deleted

## 2021-11-19 ENCOUNTER — Other Ambulatory Visit (HOSPITAL_COMMUNITY)
Admission: RE | Admit: 2021-11-19 | Discharge: 2021-11-19 | Disposition: A | Discharge status: Home / Self Care | Payer: 59 | Source: Ambulatory Visit | Attending: Gastroenterology | Admitting: Gastroenterology

## 2021-11-19 DIAGNOSIS — K760 Fatty (change of) liver, not elsewhere classified: Secondary | ICD-10-CM | POA: Diagnosis not present

## 2021-11-19 DIAGNOSIS — Z1211 Encounter for screening for malignant neoplasm of colon: Secondary | ICD-10-CM

## 2021-11-19 LAB — HEPATIC FUNCTION PANEL
ALT: 27 U/L (ref 0–44)
AST: 20 U/L (ref 15–41)
Albumin: 3.6 g/dL (ref 3.5–5.0)
Alkaline Phosphatase: 71 U/L (ref 38–126)
Bilirubin, Direct: 0.1 mg/dL (ref 0.0–0.2)
Indirect Bilirubin: 0.5 mg/dL (ref 0.3–0.9)
Total Bilirubin: 0.6 mg/dL (ref 0.3–1.2)
Total Protein: 7 g/dL (ref 6.5–8.1)

## 2021-11-22 ENCOUNTER — Encounter: Payer: Self-pay | Admitting: *Deleted

## 2021-11-22 DIAGNOSIS — Z1211 Encounter for screening for malignant neoplasm of colon: Secondary | ICD-10-CM

## 2021-12-13 ENCOUNTER — Other Ambulatory Visit (HOSPITAL_COMMUNITY)
Admission: RE | Admit: 2021-12-13 | Discharge: 2021-12-13 | Disposition: A | Discharge status: Home / Self Care | Payer: 59 | Source: Ambulatory Visit | Attending: Internal Medicine | Admitting: Internal Medicine

## 2021-12-13 ENCOUNTER — Other Ambulatory Visit: Payer: Self-pay | Admitting: *Deleted

## 2021-12-13 DIAGNOSIS — R1310 Dysphagia, unspecified: Secondary | ICD-10-CM | POA: Insufficient documentation

## 2021-12-13 DIAGNOSIS — Z1211 Encounter for screening for malignant neoplasm of colon: Secondary | ICD-10-CM | POA: Insufficient documentation

## 2021-12-13 LAB — PREGNANCY, URINE: Preg Test, Ur: NEGATIVE

## 2021-12-14 ENCOUNTER — Other Ambulatory Visit: Payer: Self-pay

## 2021-12-14 ENCOUNTER — Encounter (HOSPITAL_COMMUNITY): Payer: Self-pay

## 2021-12-14 ENCOUNTER — Ambulatory Visit (HOSPITAL_BASED_OUTPATIENT_CLINIC_OR_DEPARTMENT_OTHER): Payer: 59 | Admitting: Anesthesiology

## 2021-12-14 ENCOUNTER — Ambulatory Visit (HOSPITAL_COMMUNITY)
Admission: RE | Admit: 2021-12-14 | Discharge: 2021-12-14 | Disposition: A | Discharge status: Home / Self Care | Payer: 59 | Attending: Internal Medicine | Admitting: Internal Medicine

## 2021-12-14 ENCOUNTER — Encounter (HOSPITAL_COMMUNITY)
Admission: RE | Disposition: A | Discharge status: Home / Self Care | Payer: Self-pay | Source: Home / Self Care | Attending: Internal Medicine

## 2021-12-14 ENCOUNTER — Other Ambulatory Visit (HOSPITAL_COMMUNITY): Payer: Self-pay

## 2021-12-14 ENCOUNTER — Ambulatory Visit (HOSPITAL_COMMUNITY): Payer: 59 | Admitting: Anesthesiology

## 2021-12-14 DIAGNOSIS — R1310 Dysphagia, unspecified: Secondary | ICD-10-CM

## 2021-12-14 DIAGNOSIS — K635 Polyp of colon: Secondary | ICD-10-CM

## 2021-12-14 DIAGNOSIS — D123 Benign neoplasm of transverse colon: Secondary | ICD-10-CM | POA: Diagnosis not present

## 2021-12-14 DIAGNOSIS — D125 Benign neoplasm of sigmoid colon: Secondary | ICD-10-CM | POA: Diagnosis not present

## 2021-12-14 DIAGNOSIS — Z1212 Encounter for screening for malignant neoplasm of rectum: Secondary | ICD-10-CM | POA: Diagnosis not present

## 2021-12-14 DIAGNOSIS — Z87891 Personal history of nicotine dependence: Secondary | ICD-10-CM | POA: Insufficient documentation

## 2021-12-14 DIAGNOSIS — K449 Diaphragmatic hernia without obstruction or gangrene: Secondary | ICD-10-CM | POA: Insufficient documentation

## 2021-12-14 DIAGNOSIS — I10 Essential (primary) hypertension: Secondary | ICD-10-CM | POA: Insufficient documentation

## 2021-12-14 DIAGNOSIS — K219 Gastro-esophageal reflux disease without esophagitis: Secondary | ICD-10-CM | POA: Diagnosis not present

## 2021-12-14 DIAGNOSIS — Z139 Encounter for screening, unspecified: Secondary | ICD-10-CM | POA: Diagnosis not present

## 2021-12-14 DIAGNOSIS — Z8249 Family history of ischemic heart disease and other diseases of the circulatory system: Secondary | ICD-10-CM | POA: Diagnosis not present

## 2021-12-14 DIAGNOSIS — K317 Polyp of stomach and duodenum: Secondary | ICD-10-CM | POA: Insufficient documentation

## 2021-12-14 DIAGNOSIS — Z79899 Other long term (current) drug therapy: Secondary | ICD-10-CM | POA: Diagnosis not present

## 2021-12-14 DIAGNOSIS — K314 Gastric diverticulum: Secondary | ICD-10-CM | POA: Insufficient documentation

## 2021-12-14 DIAGNOSIS — K648 Other hemorrhoids: Secondary | ICD-10-CM | POA: Diagnosis not present

## 2021-12-14 DIAGNOSIS — Z1211 Encounter for screening for malignant neoplasm of colon: Secondary | ICD-10-CM

## 2021-12-14 HISTORY — PX: POLYPECTOMY: SHX5525

## 2021-12-14 HISTORY — PX: COLONOSCOPY WITH PROPOFOL: SHX5780

## 2021-12-14 HISTORY — PX: ESOPHAGOGASTRODUODENOSCOPY (EGD) WITH PROPOFOL: SHX5813

## 2021-12-14 HISTORY — PX: BALLOON DILATION: SHX5330

## 2021-12-14 SURGERY — COLONOSCOPY WITH PROPOFOL
Anesthesia: General

## 2021-12-14 MED ORDER — LACTATED RINGERS IV SOLN: INTRAVENOUS | Status: DC

## 2021-12-14 MED ORDER — LIDOCAINE HCL (CARDIAC) PF 100 MG/5ML IV SOSY
PREFILLED_SYRINGE | INTRAVENOUS | Status: DC | PRN
  Administered 2021-12-14: 50 mg via INTRAVENOUS

## 2021-12-14 MED ORDER — PROPOFOL 500 MG/50ML IV EMUL
INTRAVENOUS | Status: DC | PRN
  Administered 2021-12-14: 150 ug/kg/min via INTRAVENOUS

## 2021-12-14 MED ORDER — PANTOPRAZOLE SODIUM 40 MG PO TBEC
40.0000 mg | DELAYED_RELEASE_TABLET | Freq: Two times a day (BID) | ORAL | 11 refills | Status: DC
  Filled 2021-12-14 (×2): qty 60, 30d supply, fill #0

## 2021-12-14 MED ORDER — PROPOFOL 10 MG/ML IV BOLUS
INTRAVENOUS | Status: DC | PRN
  Administered 2021-12-14: 100 mg via INTRAVENOUS
  Administered 2021-12-14: 30 mg via INTRAVENOUS

## 2021-12-14 NOTE — Anesthesia Preprocedure Evaluation (Addendum)
Anesthesia Evaluation  Patient identified by MRN, date of birth, ID band Patient awake    Reviewed: Allergy & Precautions, H&P , NPO status , Patient's Chart, lab work & pertinent test results  History of Anesthesia Complications Negative for: history of anesthetic complications  Airway Mallampati: II  TM Distance: >3 FB Neck ROM: Full    Dental  (+) Dental Advisory Given, Teeth Intact   Pulmonary former smoker   Pulmonary exam normal breath sounds clear to auscultation       Cardiovascular Exercise Tolerance: Good hypertension, Pt. on medications Normal cardiovascular exam Rhythm:Regular Rate:Normal     Neuro/Psych  PSYCHIATRIC DISORDERS Anxiety     negative neurological ROS     GI/Hepatic Neg liver ROS,GERD  Medicated,,  Endo/Other  negative endocrine ROS    Renal/GU negative Renal ROS  negative genitourinary   Musculoskeletal negative musculoskeletal ROS (+)    Abdominal   Peds negative pediatric ROS (+)  Hematology negative hematology ROS (+)   Anesthesia Other Findings Right breast cancer  Reproductive/Obstetrics negative OB ROS                             Anesthesia Physical Anesthesia Plan  ASA: 2  Anesthesia Plan: General   Post-op Pain Management: Minimal or no pain anticipated   Induction: Intravenous  PONV Risk Score and Plan: Propofol infusion  Airway Management Planned: Nasal Cannula and Natural Airway  Additional Equipment:   Intra-op Plan:   Post-operative Plan:   Informed Consent: I have reviewed the patients History and Physical, chart, labs and discussed the procedure including the risks, benefits and alternatives for the proposed anesthesia with the patient or authorized representative who has indicated his/her understanding and acceptance.     Dental advisory given  Plan Discussed with: CRNA and Surgeon  Anesthesia Plan Comments:         Anesthesia Quick Evaluation

## 2021-12-14 NOTE — Op Note (Signed)
[facility] Patient Name: [name] Procedure Date: 12/14/2021 7:16 AM MRN: [id number] Date of Birth: 03-30-1970 Attending MD: Elon Alas. Abbey Chatters , [place], [id number] CSN: [id number] Age: 51 Admit Type: Outpatient Procedure:                Upper GI endoscopy Indications:              Dysphagia, Heartburn Providers:                Elon Alas. Abbey Chatters, DO, Janeece Riggers, RN, Randa Spike, Technician Referring MD:              Medicines:                See the Anesthesia note for documentation of the                            administered medications Complications:            No immediate complications. Estimated Blood Loss:     Estimated blood loss was minimal. Procedure:                Pre-Anesthesia Assessment:                           - The anesthesia plan was to use monitored                            anesthesia care (MAC).                           After obtaining informed consent, the endoscope was                            passed under direct vision. Throughout the                            procedure, the patient's blood pressure, pulse, and                            oxygen saturations were monitored continuously. The                            [id number]-H190 ([id number]) scope was introduced through the                            mouth, and advanced to the second part of duodenum.                            The upper GI endoscopy was accomplished without                            difficulty. The patient tolerated the procedure                            well. Scope In: 8:35:21 AM  Scope Out: 8:41:39 AM Total Procedure Duration: 0 hours 6 minutes 18 seconds  Findings:      A 3 cm hiatal hernia was present.      No endoscopic abnormality was evident in the esophagus to explain the       patient's complaint of dysphagia. Preparations were made for empiric       dilation. A TTS dilator was passed through the scope. Dilation with an       18-19-20 mm balloon  dilator was performed to 20 mm. Dilation was       performed with a mild resistance at 20 mm. Estimated blood loss was none.      A non-bleeding diverticulum was found in the gastric fundus.      Four 4 mm sessile polyps with no bleeding and no stigmata of recent       bleeding were found in the gastric body. The polyp was removed with a       cold snare. Resection and retrieval were complete.      The duodenal bulb, first portion of the duodenum and second portion of       the duodenum were normal. Impression:               - 3 cm hiatal hernia.                           - Gastric diverticulum.                           - Four gastric polyps. Resected and retrieved.                           - Normal duodenal bulb, first portion of the                            duodenum and second portion of the duodenum. Moderate Sedation:      Per Anesthesia Care Recommendation:           - Patient has a contact number available for                            emergencies. The signs and symptoms of potential                            delayed complications were discussed with the                            patient. Return to normal activities tomorrow.                            Written discharge instructions were provided to the                            patient.                           - Resume previous diet.                           -  Continue present medications.                           - Await pathology results.                           - Repeat upper endoscopy PRN for retreatment.                           - Return to GI clinic in 3 months.                           - Use Protonix (pantoprazole) 40 mg PO BID for 12                            weeks. Procedure Code(s):        --- Professional ---                           [phone], Esophagogastroduodenoscopy, flexible,                            transoral; with removal of tumor(s), polyp(s), or                            other lesion(s) by  snare technique Diagnosis Code(s):        --- Professional ---                           K44.9, Diaphragmatic hernia without obstruction or                            gangrene                           K31.4, Gastric diverticulum                           K31.7, Polyp of stomach and duodenum                           R13.10, Dysphagia, unspecified                           R12, Heartburn CPT copyright 2022 American Medical Association. All rights reserved. The codes documented in this report are preliminary and upon coder review may  be revised to meet current compliance requirements. Elon Alas. Abbey Chatters, DO [place] Abbey Chatters, DO 12/14/2021 8:45:32 AM This report has been signed electronically. Number of Addenda: 0

## 2021-12-14 NOTE — H&P (Signed)
Primary Care Physician:  Lindell Spar, MD Primary Gastroenterologist:  Dr. Abbey Chatters  Pre-Procedure History & Physical: HPI:  [name] is a 51 y.o. female is here for an EGD with possible dilation for GERD/dysphagia and a colonoscopy to be performed for colon cancer screening purposes.  Past Medical History:  Diagnosis Date   Breast cancer ([place]) [phone]   rt breast   Breast disorder    cancer right breast   Cancer ([place])    right lobular carcinoma in situ   Gastroparesis    controlled    Past Surgical History:  Procedure Laterality Date   BACK SURGERY  10/2002   lumbar hemilaminectomy, microdiscectomy L5-S1   BREAST BIOPSY Right 07/2016   malignant; waiting on MRI results   BREAST LUMPECTOMY Right 2012,2018   BREAST LUMPECTOMY WITH RADIOACTIVE SEED LOCALIZATION Right 10/08/2016   Procedure: RIGHT BREAST LUMPECTOMY WITH RADIOACTIVE SEED LOCALIZATION;  Surgeon: Fanny Skates, MD;  Location: [facility];  Service: General;  Laterality: Right;   BREAST SURGERY     ESOPHAGOGASTRODUODENOSCOPY  08/2007   normal esophagus, large gastric diverticulum, patent pylorus   TUBAL LIGATION  2001    Prior to Admission medications   Medication Sig Start Date End Date Taking? Authorizing Provider  famotidine (PEPCID) 40 MG tablet Take 1 tablet (40 mg total) by mouth daily. 11/06/21  Yes Dion Saucier A, PA  ibuprofen (ADVIL) 200 MG tablet Take 800 mg by mouth every 8 (eight) hours as needed for moderate pain.   Yes [provider]  pantoprazole (PROTONIX) 40 MG tablet Take 1 tablet (40 mg total) by mouth daily 30 minutes before breakfast. 11/13/21  Yes Annitta Needs, NP  Semaglutide-Weight Management (WEGOVY) 2.4 MG/0.75ML SOAJ Inject 2.4 mg into the skin every 7 (seven) days. 09/06/21  Yes Lindell Spar, MD  Sod Picosulfate-Mag Ox-Cit [facility]) 10-3.5-12 MG-GM -GM/175ML SOLN Take 1 kit by mouth as directed. 11/16/21  Yes Eloise Harman, DO    Allergies as  of 11/16/2021 - Review Complete 11/13/2021  Allergen Reaction Noted   Augmentin [amoxicillin-pot clavulanate] Hives 12/03/2010    Family History  Problem Relation Age of Onset   Hypertension Mother    Hyperlipidemia Mother    Diverticulitis Mother    Hernia Mother    Heart disease Father        arrythmia   Hypertension Father    Arthritis Brother    Heart attack Maternal Grandmother    Aneurysm Maternal Grandfather    Congestive Heart Failure Paternal Grandmother    Cancer Maternal Uncle        pancreatic   Colon cancer Neg Hx    Colon polyps Neg Hx     Social History   Socioeconomic History   Marital status: Married    Spouse name: Not on file   Number of children: Not on file   Years of education: Not on file   Highest education level: Not on file  Occupational History   Not on file  Tobacco Use   Smoking status: Former    Packs/day: 1.00    Years: 16.00    Total pack years: 16.00    Types: Cigarettes    Quit date: 08/12/2000    Years since quitting: 21.3   Smokeless tobacco: Never   Tobacco comments:    quit 10 yrs  Vaping Use   Vaping Use: Never used  Substance and Sexual Activity   Alcohol use: Yes    Comment: occasionally  Drug use: No   Sexual activity: Yes    Birth control/protection: Surgical    Comment: tubal  Other Topics Concern   Not on file  Social History Narrative   Not on file   Social Determinants of Health   Financial Resource Strain: Low Risk  (09/08/2020)   Overall Financial Resource Strain (CARDIA)    Difficulty of Paying Living Expenses: Not hard at all  Food Insecurity: No Food Insecurity (09/08/2020)   Hunger Vital Sign    Worried About Running Out of Food in the Last Year: Never true    [place] in the Last Year: Never true  Transportation Needs: No Transportation Needs (09/08/2020)   PRAPARE - Hydrologist (Medical): No    Lack of Transportation (Non-Medical): No  Physical Activity:  Insufficiently Active (09/08/2020)   Exercise Vital Sign    Days of Exercise per Week: 1 day    Minutes of Exercise per Session: 30 min  Stress: No Stress Concern Present (09/08/2020)   [place]    Feeling of Stress : Only a little  Social Connections: Moderately Integrated (09/08/2020)   Social Connection and Isolation Panel [NHANES]    Frequency of Communication with Friends and Family: More than three times a week    Frequency of Social Gatherings with Friends and Family: Three times a week    Attends Religious Services: Never    Active Member of Clubs or Organizations: Yes    Attends Archivist Meetings: More than 4 times per year    Marital Status: Married  Human resources officer Violence: Not At Risk (09/08/2020)   Humiliation, Afraid, Rape, and Kick questionnaire    Fear of Current or Ex-Partner: No    Emotionally Abused: No    Physically Abused: No    Sexually Abused: No    Review of Systems: See HPI, otherwise negative ROS  Physical Exam: Vital signs in last 24 hours: Temp:  [98 F (36.7 C)] 98 F (36.7 C) (11/03 0655) Pulse Rate:  [73] 73 (11/03 0655) Resp:  [18] 18 (11/03 0655) BP: (137)/(93) 137/93 (11/03 0655) SpO2:  [99 %] 99 % (11/03 0655) Weight:  [94.3 kg] 94.3 kg (11/03 0655)   General:   Alert,  Well-developed, well-nourished, pleasant and cooperative in NAD Head:  Normocephalic and atraumatic. Eyes:  Sclera clear, no icterus.   Conjunctiva pink. Ears:  Normal auditory acuity. Nose:  No deformity, discharge,  or lesions. Mouth:  No deformity or lesions, dentition normal. Neck:  Supple; no masses or thyromegaly. Lungs:  Clear throughout to auscultation.   No wheezes, crackles, or rhonchi. No acute distress. Heart:  Regular rate and rhythm; no murmurs, clicks, rubs,  or gallops. Abdomen:  Soft, nontender and nondistended. No masses, hepatosplenomegaly or hernias noted. Normal bowel  sounds, without guarding, and without rebound.   Msk:  Symmetrical without gross deformities. Normal posture. Extremities:  Without clubbing or edema. Neurologic:  Alert and  oriented x4;  grossly normal neurologically. Skin:  Intact without significant lesions or rashes. Cervical Nodes:  No significant cervical adenopathy. Psych:  Alert and cooperative. Normal mood and affect.  Impression/Plan: [name] is here for an EGD with possible dilation for GERD/dysphagia and a colonoscopy to be performed for colon cancer screening purposes.  The risks of the procedure including infection, bleed, or perforation as well as benefits, limitations, alternatives and imponderables have been reviewed with the patient. Questions  have been answered. All parties agreeable.

## 2021-12-14 NOTE — Op Note (Signed)
[facility] Patient Name: [name] Procedure Date: 12/14/2021 8:43 AM MRN: [id number] Date of Birth: 06-03-1970 Attending MD: Elon Alas. Abbey Chatters , [place], [id number] CSN: [id number] Age: 51 Admit Type: Outpatient Procedure:                Colonoscopy Indications:              Screening for colorectal malignant neoplasm Providers:                Elon Alas. Abbey Chatters, DO, Janeece Riggers, RN, Randa Spike, Technician Referring MD:              Medicines:                See the Anesthesia note for documentation of the                            administered medications Complications:            No immediate complications. Estimated Blood Loss:     Estimated blood loss was minimal. Procedure:                Pre-Anesthesia Assessment:                           - The anesthesia plan was to use monitored                            anesthesia care (MAC).                           After obtaining informed consent, the colonoscope                            was passed under direct vision. Throughout the                            procedure, the patient's blood pressure, pulse, and                            oxygen saturations were monitored continuously. The                            PCF-HQ190L ([id number]) scope was introduced through                            the anus and advanced to the the cecum, identified                            by appendiceal orifice and ileocecal valve. The                            colonoscopy was performed without difficulty. The                            patient tolerated the procedure well.  The quality                            of the bowel preparation was evaluated using the                            BBPS [facility] Bowel Preparation Scale) with scores                            of: Right Colon = 3, Transverse Colon = 3 and Left                            Colon = 3 (entire mucosa seen well with no residual                             staining, small fragments of stool or opaque                            liquid). The total BBPS score equals 9. Scope In: 7:51:02 AM Scope Out: 8:56:34 AM Scope Withdrawal Time: 0 hours 8 minutes 20 seconds  Total Procedure Duration: 0 hours 9 minutes 50 seconds  Findings:      The perianal and digital rectal examinations were normal.      Non-bleeding internal hemorrhoids were found during endoscopy.      Three sessile polyps were found in the sigmoid colon and transverse       colon. The polyps were 4 to 7 mm in size. These polyps were removed with       a cold snare. Resection and retrieval were complete.      The exam was otherwise without abnormality. Impression:               - Non-bleeding internal hemorrhoids.                           - Three 4 to 7 mm polyps in the sigmoid colon and                            in the transverse colon, removed with a cold snare.                            Resected and retrieved.                           - The examination was otherwise normal. Moderate Sedation:      Per Anesthesia Care Recommendation:           - Patient has a contact number available for                            emergencies. The signs and symptoms of potential                            delayed complications were discussed with the  patient. Return to normal activities tomorrow.                            Written discharge instructions were provided to the                            patient.                           - Resume previous diet.                           - Continue present medications.                           - Await pathology results.                           - Repeat colonoscopy in 5 years for surveillance.                           - Return to GI clinic in 3 months. Procedure Code(s):        --- Professional ---                           [phone], Colonoscopy, flexible; with removal of                            tumor(s), polyp(s),  or other lesion(s) by snare                            technique Diagnosis Code(s):        --- Professional ---                           Z12.11, Encounter for screening for malignant                            neoplasm of colon                           D12.5, Benign neoplasm of sigmoid colon                           D12.3, Benign neoplasm of transverse colon (hepatic                            flexure or splenic flexure)                           K64.8, Other hemorrhoids CPT copyright 2022 American Medical Association. All rights reserved. The codes documented in this report are preliminary and upon coder review may  be revised to meet current compliance requirements. Elon Alas. Abbey Chatters, DO [place] Abbey Chatters, DO 12/14/2021 8:58:57 AM This report has been signed electronically. Number of Addenda: 0

## 2021-12-14 NOTE — Transfer of Care (Signed)
Immediate Anesthesia Transfer of Care Note  Patient: [name]  Procedure(s) Performed: COLONOSCOPY WITH PROPOFOL ESOPHAGOGASTRODUODENOSCOPY (EGD) WITH PROPOFOL BALLOON DILATION POLYPECTOMY  Patient Location: Endoscopy Unit  Anesthesia Type:General  Level of Consciousness: awake  Airway & Oxygen Therapy: Patient Spontanous Breathing  Post-op Assessment: Report given to RN and Post -op Vital signs reviewed and stable  Post vital signs: Reviewed and stable  Last Vitals:  Vitals Value Taken Time  BP 92/67 12/14/21 0859  Temp 36.4 C 12/14/21 0859  Pulse 64 12/14/21 0859  Resp 20 12/14/21 0859  SpO2 95 % 12/14/21 0859    Last Pain:  Vitals:   12/14/21 0859  TempSrc: Oral  PainSc:          Complications: No notable events documented.

## 2021-12-14 NOTE — Discharge Instructions (Addendum)
EGD Discharge instructions Please read the instructions outlined below and refer to this sheet in the next few weeks. These discharge instructions provide you with general information on caring for yourself after you leave the hospital. Your doctor may also give you specific instructions. While your treatment has been planned according to the most current medical practices available, unavoidable complications occasionally occur. If you have any problems or questions after discharge, please call your doctor. ACTIVITY You may resume your regular activity but move at a slower pace for the next 24 hours.  Take frequent rest periods for the next 24 hours.  Walking will help expel (get rid of) the air and reduce the bloated feeling in your abdomen.  No driving for 24 hours (because of the anesthesia (medicine) used during the test).  You may shower.  Do not sign any important legal documents or operate any machinery for 24 hours (because of the anesthesia used during the test).  NUTRITION Drink plenty of fluids.  You may resume your normal diet.  Begin with a light meal and progress to your normal diet.  Avoid alcoholic beverages for 24 hours or as instructed by your caregiver.  MEDICATIONS You may resume your normal medications unless your caregiver tells you otherwise.  WHAT YOU CAN EXPECT TODAY You may experience abdominal discomfort such as a feeling of fullness or "gas" pains.  FOLLOW-UP Your doctor will discuss the results of your test with you.  SEEK IMMEDIATE MEDICAL ATTENTION IF ANY OF THE FOLLOWING OCCUR: Excessive nausea (feeling sick to your stomach) and/or vomiting.  Severe abdominal pain and distention (swelling).  Trouble swallowing.  Temperature over 101 F (37.8 C).  Rectal bleeding or vomiting of blood.     Colonoscopy Discharge Instructions  Read the instructions outlined below and refer to this sheet in the next few weeks. These discharge instructions provide you with  general information on caring for yourself after you leave the hospital. Your doctor may also give you specific instructions. While your treatment has been planned according to the most current medical practices available, unavoidable complications occasionally occur.   ACTIVITY You may resume your regular activity, but move at a slower pace for the next 24 hours.  Take frequent rest periods for the next 24 hours.  Walking will help get rid of the air and reduce the bloated feeling in your belly (abdomen).  No driving for 24 hours (because of the medicine (anesthesia) used during the test).   Do not sign any important legal documents or operate any machinery for 24 hours (because of the anesthesia used during the test).  NUTRITION Drink plenty of fluids.  You may resume your normal diet as instructed by your doctor.  Begin with a light meal and progress to your normal diet. Heavy or fried foods are harder to digest and may make you feel sick to your stomach (nauseated).  Avoid alcoholic beverages for 24 hours or as instructed.  MEDICATIONS You may resume your normal medications unless your doctor tells you otherwise.  WHAT YOU CAN EXPECT TODAY Some feelings of bloating in the abdomen.  Passage of more gas than usual.  Spotting of blood in your stool or on the toilet paper.  IF YOU HAD POLYPS REMOVED DURING THE COLONOSCOPY: No aspirin products for 7 days or as instructed.  No alcohol for 7 days or as instructed.  Eat a soft diet for the next 24 hours.  FINDING OUT THE RESULTS OF YOUR TEST Not all test results are  available during your visit. If your test results are not back during the visit, make an appointment with your caregiver to find out the results. Do not assume everything is normal if you have not heard from your caregiver or the medical facility. It is important for you to follow up on all of your test results.  SEEK IMMEDIATE MEDICAL ATTENTION IF: You have more than a spotting of  blood in your stool.  Your belly is swollen (abdominal distention).  You are nauseated or vomiting.  You have a temperature over 101.  You have abdominal pain or discomfort that is severe or gets worse throughout the day.   Your upper endoscopy showed a medium size hiatal hernia,  and diverticulum of your stomach.  One polyp in your stomach which I removed successfully.  Duodenum appeared normal.  I did stretch your esophagus today, hopefully this helps with your swallowing.  I am going to increase your pantoprazole to twice daily for the next 12 weeks at which point you can decrease down to once daily.  Your colonoscopy revealed 3 polyp(s) which I removed successfully. Await pathology results, my office will contact you. I recommend repeating colonoscopy in 5 years for surveillance purposes.   Follow-up with GI in 3 months. Sent a message to the office and they will schedule this appointment.   I hope you have a great rest of your week!  Elon Alas. Abbey Chatters, D.O. Gastroenterology and Hepatology [facility]

## 2021-12-14 NOTE — Anesthesia Postprocedure Evaluation (Signed)
Anesthesia Post Note  Patient: [name]  Procedure(s) Performed: COLONOSCOPY WITH PROPOFOL ESOPHAGOGASTRODUODENOSCOPY (EGD) WITH PROPOFOL BALLOON DILATION POLYPECTOMY  Patient location during evaluation: Phase II Anesthesia Type: General Level of consciousness: awake and alert and oriented Pain management: pain level controlled Vital Signs Assessment: post-procedure vital signs reviewed and stable Respiratory status: spontaneous breathing, nonlabored ventilation and respiratory function stable Cardiovascular status: blood pressure returned to baseline and stable Postop Assessment: no apparent nausea or vomiting Anesthetic complications: no   No notable events documented.   Last Vitals:  Vitals:   12/14/21 0859 12/14/21 0901  BP: (!) 77/50 92/67  Pulse: 64 68  Resp: 20 19  Temp: 36.4 C   SpO2: 95% 96%    Last Pain:  Vitals:   12/14/21 0901  TempSrc:   PainSc: 0-No pain                 Arlo Buffone C Nashira Mcglynn

## 2021-12-17 LAB — SURGICAL PATHOLOGY

## 2021-12-21 ENCOUNTER — Encounter (HOSPITAL_COMMUNITY): Payer: Self-pay | Admitting: Internal Medicine

## 2022-01-22 ENCOUNTER — Encounter: Payer: Self-pay | Admitting: Gastroenterology

## 2022-01-22 ENCOUNTER — Ambulatory Visit (INDEPENDENT_AMBULATORY_CARE_PROVIDER_SITE_OTHER): Payer: 59 | Admitting: Gastroenterology

## 2022-01-22 VITALS — BP 115/85 | HR 77 | Temp 97.8°F | Ht 67.0 in | Wt 216.7 lb

## 2022-01-22 DIAGNOSIS — K219 Gastro-esophageal reflux disease without esophagitis: Secondary | ICD-10-CM | POA: Diagnosis not present

## 2022-01-22 NOTE — Progress Notes (Signed)
Gastroenterology Office Note     Primary Care Physician:  Lindell Spar, MD  Primary Gastroenterologist: Dr. Abbey Chatters    Chief Complaint   Chief Complaint  Patient presents with   Follow-up    Patient here today for a follow up on gerd. Patient had egd with dilation  and tcs on 12/14/2021. Patient is taking pantoprazole 40 mg once per day usually,but it is prescribed for bid.      History of Present Illness   [name] is a 51 y.o. female presenting today in follow-up with a history of GERD, gastroparesis, abdominal pain, dysphagia, recently undergoing colonoscopy/EGD/dilation.  Will need surveillance colonoscopy in 2028 due to history of polyps. EGD with gastric diverticulum, four gastric polyps, empiric dilation.    She has stopped [place]. Notes feeling improved after this. GERD controlled on once daily pantoprazole. No dysphagia. No abdominal pain. No N/V. Tolerating diet. No complaints today. Will be talking with PCP about lower dosage of Wegovy.     Past Medical History:  Diagnosis Date   Breast cancer [facility]) [id number]   rt breast   Breast disorder    cancer right breast   Cancer ([place])    right lobular carcinoma in situ   Gastroparesis    controlled    Past Surgical History:  Procedure Laterality Date   BACK SURGERY  10/2002   lumbar hemilaminectomy, microdiscectomy L5-S1   BALLOON DILATION N/A 12/14/2021   Procedure: BALLOON DILATION;  Surgeon: Eloise Harman, DO;  [facility];  Service: Endoscopy;  Laterality: N/A;   BREAST BIOPSY Right 07/2016   malignant; waiting on MRI results   BREAST LUMPECTOMY Right 2012,2018   BREAST LUMPECTOMY WITH RADIOACTIVE SEED LOCALIZATION Right 10/08/2016   Procedure: RIGHT BREAST LUMPECTOMY WITH RADIOACTIVE SEED LOCALIZATION;  Surgeon: Fanny Skates, MD;  Location: [facility];  Service: General;  Laterality: Right;   BREAST SURGERY     COLONOSCOPY WITH PROPOFOL N/A 12/14/2021   Dr.  Abbey Chatters: non-bleeding internal hemorrhoids, three 4-7 mm polyps (tubular and hyperplastic). Surveillance in 5 years   ESOPHAGOGASTRODUODENOSCOPY  08/2007   normal esophagus, large gastric diverticulum, patent pylorus   ESOPHAGOGASTRODUODENOSCOPY (EGD) WITH PROPOFOL N/A 12/14/2021   3 cm hiatal hernia, gastric diverticulum, four gastric fundic gland polyps, empiric dilation   POLYPECTOMY  12/14/2021   Procedure: POLYPECTOMY;  Surgeon: Eloise Harman, DO;  [facility];  Service: Endoscopy;;   TUBAL LIGATION  2001    Current Outpatient Medications  Medication Sig Dispense Refill   ibuprofen (ADVIL) 200 MG tablet Take 800 mg by mouth every 8 (eight) hours as needed for moderate pain.     pantoprazole (PROTONIX) 40 MG tablet Take 1 tablet (40 mg total) by mouth 2 (two) times daily. 60 tablet 11   No current facility-administered medications for this visit.    Allergies as of 01/22/2022 - Review Complete 01/22/2022  Allergen Reaction Noted   Augmentin [amoxicillin-pot clavulanate] Hives 12/03/2010    Family History  Problem Relation Age of Onset   Hypertension Mother    Hyperlipidemia Mother    Diverticulitis Mother    Hernia Mother    Heart disease Father        arrythmia   Hypertension Father    Arthritis Brother    Heart attack Maternal Grandmother    Aneurysm Maternal Grandfather    Congestive Heart Failure Paternal Grandmother    Cancer Maternal Uncle        pancreatic  Colon cancer Neg Hx    Colon polyps Neg Hx     Social History   Socioeconomic History   Marital status: Married    Spouse name: Not on file   Number of children: Not on file   Years of education: Not on file   Highest education level: Not on file  Occupational History   Not on file  Tobacco Use   Smoking status: Former    Packs/day: 1.00    Years: 16.00    Total pack years: 16.00    Types: Cigarettes    Quit date: 08/12/2000    Years since quitting: 21.4   Smokeless tobacco:  Never   Tobacco comments:    quit 10 yrs  Vaping Use   Vaping Use: Never used  Substance and Sexual Activity   Alcohol use: Yes    Comment: occasionally   Drug use: No   Sexual activity: Yes    Birth control/protection: Surgical    Comment: tubal  Other Topics Concern   Not on file  Social History Narrative   Not on file   Social Determinants of Health   Financial Resource Strain: Low Risk  (09/08/2020)   Overall Financial Resource Strain (CARDIA)    Difficulty of Paying Living Expenses: Not hard at all  Food Insecurity: No Food Insecurity (09/08/2020)   Hunger Vital Sign    Worried About Running Out of Food in the Last Year: Never true    [place] in the Last Year: Never true  Transportation Needs: No Transportation Needs (09/08/2020)   PRAPARE - Hydrologist (Medical): No    Lack of Transportation (Non-Medical): No  Physical Activity: Insufficiently Active (09/08/2020)   Exercise Vital Sign    Days of Exercise per Week: 1 day    Minutes of Exercise per Session: 30 min  Stress: No Stress Concern Present (09/08/2020)   [place]    Feeling of Stress : Only a little  Social Connections: Moderately Integrated (09/08/2020)   Social Connection and Isolation Panel [NHANES]    Frequency of Communication with Friends and Family: More than three times a week    Frequency of Social Gatherings with Friends and Family: Three times a week    Attends Religious Services: Never    Active Member of Clubs or Organizations: Yes    Attends Archivist Meetings: More than 4 times per year    Marital Status: Married  Human resources officer Violence: Not At Risk (09/08/2020)   Humiliation, Afraid, Rape, and Kick questionnaire    Fear of Current or Ex-Partner: No    Emotionally Abused: No    Physically Abused: No    Sexually Abused: No     Review of Systems   Gen: Denies any fever,  chills, fatigue, weight loss, lack of appetite.  CV: Denies chest pain, heart palpitations, peripheral edema, syncope.  Resp: Denies shortness of breath at rest or with exertion. Denies wheezing or cough.  GI: Denies dysphagia or odynophagia. Denies jaundice, hematemesis, fecal incontinence. GU : Denies urinary burning, urinary frequency, urinary hesitancy MS: Denies joint pain, muscle weakness, cramps, or limitation of movement.  Derm: Denies rash, itching, dry skin Psych: Denies depression, anxiety, memory loss, and confusion Heme: Denies bruising, bleeding, and enlarged lymph nodes.   Physical Exam   BP 115/85 (BP Location: Right Arm, Patient Position: Sitting, Cuff Size: Large)   Pulse 77   Temp 97.8  F (36.6 C) (Temporal)   Ht '5\' 7"'$  (1.702 m)   Wt 216 lb 11.2 oz (98.3 kg)   BMI 33.94 kg/m  General:   Alert and oriented. Pleasant and cooperative. Well-nourished and well-developed.  Head:  Normocephalic and atraumatic. Eyes:  Without icterus Abdomen:  +BS, soft, non-tender and non-distended. No HSM noted. No guarding or rebound. No masses appreciated.  Rectal:  Deferred  Msk:  Symmetrical without gross deformities. Normal posture. Extremities:  Without edema. Neurologic:  Alert and  oriented x4;  grossly normal neurologically. Skin:  Intact without significant lesions or rashes. Psych:  Alert and cooperative. Normal mood and affect.   Assessment   Torina BRONWYN BELASCO is a delightful 51 y.o. female presenting today in follow-up with a history of  GERD, gastroparesis, abdominal pain, dysphagia, recently undergoing colonoscopy/EGD/dilation.  GERD: doing well on PPI daily. Abdominal pain resolved. Likely had flare of gastritis. Discontinuing Mancel Parsons has been helpful. I suspect the side effects of delayed gastric emptying exacerbated already known history of gastroparesis. Could consider lower dosage or different agent all together. Patient to discuss with PCP.  History of polyps:  surveillance due in 2028,   Dysphagia: resolved s/p dilation.    PLAN    PPI daily Return prn Colonoscopy in 2028    Annitta Needs, PhD, ANP-BC [facility] Gastroenterology

## 2022-01-22 NOTE — Patient Instructions (Signed)
I am glad you are doing well!  Continue pantoprazole daily.  We can see you back as needed!  Your next colonoscopy will be in 5 years!  Have a wonderful Christmas!  I enjoyed seeing you again today! As you know, I value our relationship and want to provide genuine, compassionate, and quality care. I welcome your feedback. If you receive a survey regarding your visit,  I greatly appreciate you taking time to fill this out. See you next time!  Annitta Needs, PhD, ANP-BC [facility] Gastroenterology

## 2022-02-01 ENCOUNTER — Inpatient Hospital Stay: Payer: 59 | Attending: Hematology

## 2022-02-01 ENCOUNTER — Ambulatory Visit (HOSPITAL_COMMUNITY)
Admission: RE | Admit: 2022-02-01 | Discharge: 2022-02-01 | Disposition: A | Discharge status: Home / Self Care | Payer: 59 | Source: Ambulatory Visit | Attending: Hematology | Admitting: Hematology

## 2022-02-01 DIAGNOSIS — D751 Secondary polycythemia: Secondary | ICD-10-CM | POA: Insufficient documentation

## 2022-02-01 DIAGNOSIS — Z1231 Encounter for screening mammogram for malignant neoplasm of breast: Secondary | ICD-10-CM | POA: Insufficient documentation

## 2022-02-01 DIAGNOSIS — Z79899 Other long term (current) drug therapy: Secondary | ICD-10-CM | POA: Insufficient documentation

## 2022-02-01 DIAGNOSIS — C50911 Malignant neoplasm of unspecified site of right female breast: Secondary | ICD-10-CM

## 2022-02-01 DIAGNOSIS — Z86000 Personal history of in-situ neoplasm of breast: Secondary | ICD-10-CM | POA: Diagnosis not present

## 2022-02-01 LAB — COMPREHENSIVE METABOLIC PANEL
ALT: 26 U/L (ref 0–44)
AST: 25 U/L (ref 15–41)
Albumin: 3.7 g/dL (ref 3.5–5.0)
Alkaline Phosphatase: 64 U/L (ref 38–126)
Anion gap: 5 (ref 5–15)
BUN: 14 mg/dL (ref 6–20)
CO2: 23 mmol/L (ref 22–32)
Calcium: 9 mg/dL (ref 8.9–10.3)
Chloride: 109 mmol/L (ref 98–111)
Creatinine, Ser: 1 mg/dL (ref 0.44–1.00)
GFR, Estimated: >60 mL/min (ref >60)
Glucose, Bld: 106 mg/dL — ABNORMAL HIGH (ref 70–99)
Potassium: 3.9 mmol/L (ref 3.5–5.1)
Sodium: 137 mmol/L (ref 135–145)
Total Bilirubin: 0.6 mg/dL (ref 0.3–1.2)
Total Protein: 7.1 g/dL (ref 6.5–8.1)

## 2022-02-01 LAB — CBC WITH DIFFERENTIAL/PLATELET
Abs Immature Granulocytes: 0.01 10*3/uL (ref 0.00–0.07)
Basophils Absolute: 0 10*3/uL (ref 0.0–0.1)
Basophils Relative: 1 %
Eosinophils Absolute: 0.1 10*3/uL (ref 0.0–0.5)
Eosinophils Relative: 1 %
HCT: 46.9 % — ABNORMAL HIGH (ref 36.0–46.0)
Hemoglobin: 15.5 g/dL — ABNORMAL HIGH (ref 12.0–15.0)
Immature Granulocytes: 0 %
Lymphocytes Relative: 40 %
Lymphs Abs: 2.2 10*3/uL (ref 0.7–4.0)
MCH: 30.2 pg (ref 26.0–34.0)
MCHC: 33 g/dL (ref 30.0–36.0)
MCV: 91.4 fL (ref 80.0–100.0)
Monocytes Absolute: 0.4 10*3/uL (ref 0.1–1.0)
Monocytes Relative: 8 %
Neutro Abs: 2.7 10*3/uL (ref 1.7–7.7)
Neutrophils Relative %: 50 %
Platelets: 213 10*3/uL (ref 150–400)
RBC: 5.13 MIL/uL — ABNORMAL HIGH (ref 3.87–5.11)
RDW: 12.1 % (ref 11.5–15.5)
WBC: 5.4 10*3/uL (ref 4.0–10.5)
nRBC: 0 % (ref 0.0–0.2)

## 2022-02-01 LAB — VITAMIN D 25 HYDROXY (VIT D DEFICIENCY, FRACTURES): Vit D, 25-Hydroxy: 46.89 ng/mL (ref 30–100)

## 2022-02-07 ENCOUNTER — Inpatient Hospital Stay: Payer: 59 | Admitting: Hematology

## 2022-02-07 VITALS — BP 110/94 | HR 79 | Temp 97.8°F | Resp 16 | Wt 217.7 lb

## 2022-02-07 DIAGNOSIS — D751 Secondary polycythemia: Secondary | ICD-10-CM | POA: Diagnosis not present

## 2022-02-07 DIAGNOSIS — D582 Other hemoglobinopathies: Secondary | ICD-10-CM

## 2022-02-07 DIAGNOSIS — C50911 Malignant neoplasm of unspecified site of right female breast: Secondary | ICD-10-CM

## 2022-02-07 NOTE — Progress Notes (Signed)
[facility] [address], [place] [address]   Patient Care Team: Lindell Spar, MD as PCP - General (Internal Medicine)  SUMMARY OF ONCOLOGIC HISTORY: Oncology History   No history exists.    CHIEF COMPLIANT: Follow-up for right breast LCIS   INTERVAL HISTORY: [name] is a 51 y.o. female seen for follow-up of recurrent right breast LCIS.  She reports that she lost 25 pounds on [facility] for the last year and discontinued due to intolerance for higher dose.  She is no longer taking blood pressure medication for the past few months.  She quit taking tamoxifen approximately a year and half ago due to hot flashes.  Denies any aquagenic pruritus or vasomotor symptoms.  REVIEW OF SYSTEMS:   Review of Systems  Constitutional:  Negative for appetite change.  All other systems reviewed and are negative.   I have reviewed the past medical history, past surgical history, social history and family history with the patient and they are unchanged from previous note.   ALLERGIES:   is allergic to augmentin [amoxicillin-pot clavulanate].   MEDICATIONS:  Current Outpatient Medications  Medication Sig Dispense Refill   ibuprofen (ADVIL) 200 MG tablet Take 800 mg by mouth every 8 (eight) hours as needed for moderate pain.     pantoprazole (PROTONIX) 40 MG tablet Take 1 tablet (40 mg total) by mouth 2 (two) times daily. (Patient not taking: Reported on 02/07/2022) 60 tablet 11   No current facility-administered medications for this visit.     PHYSICAL EXAMINATION: Performance status (ECOG): 0 - Asymptomatic  Vitals:   02/07/22 0818  BP: (!) 110/94  Pulse: 79  Resp: 16  Temp: 97.8 F (36.6 C)  SpO2: 98%   Wt Readings from Last 3 Encounters:  02/07/22 217 lb 11.2 oz (98.7 kg)  01/22/22 216 lb 11.2 oz (98.3 kg)  12/14/21 208 lb (94.3 kg)   Physical Exam Vitals reviewed.  Constitutional:      Appearance: Normal appearance.  Cardiovascular:     Rate  and Rhythm: Normal rate and regular rhythm.     Pulses: Normal pulses.     Heart sounds: Normal heart sounds.  Pulmonary:     Effort: Pulmonary effort is normal.     Breath sounds: Normal breath sounds.  Neurological:     General: No focal deficit present.     Mental Status: She is alert and oriented to person, place, and time.  Psychiatric:        Mood and Affect: Mood normal.        Behavior: Behavior normal.    Breast Exam Chaperone: Thana Ates     LABORATORY DATA:  I have reviewed the data as listed    Latest Ref Rng & Units 02/01/2022    8:43 AM 11/19/2021   10:03 AM 11/06/2021   11:15 AM  CMP  Glucose 70 - 99 mg/dL 106   100   BUN 6 - 20 mg/dL 14   13   Creatinine 0.44 - 1.00 mg/dL 1.00   1.02   Sodium 135 - 145 mmol/L 137   138   Potassium 3.5 - 5.1 mmol/L 3.9   4.5   Chloride 98 - 111 mmol/L 109   106   CO2 22 - 32 mmol/L 23   23   Calcium 8.9 - 10.3 mg/dL 9.0   8.9   Total Protein 6.5 - 8.1 g/dL 7.1  7.0  7.7   Total Bilirubin  0.3 - 1.2 mg/dL 0.6  0.6  1.1   Alkaline Phos 38 - 126 U/L 64  71  122   AST 15 - 41 U/L 25  20  179   ALT 0 - 44 U/L 26  27  78    No results found for: "CAN153" Lab Results  Component Value Date   WBC 5.4 02/01/2022   HGB 15.5 (H) 02/01/2022   HCT 46.9 (H) 02/01/2022   MCV 91.4 02/01/2022   PLT 213 02/01/2022   NEUTROABS 2.7 02/01/2022    ASSESSMENT:  1.  Recurrent right breast LCIS: -Initially diagnosed with right breast LCIS in 2012, status post lumpectomy, ER/PR positive, did not take tamoxifen. -Recurrent right breast LCIS, status post lumpectomy on 10/08/2016. -Tamoxifen started in September 2018, self discontinued around June 2022 due to intolerance from hot flashes.    PLAN:  1.  Recurrent right breast LCIS: - She is no longer taking tamoxifen.  Breast exam was not performed today. - Reviewed labs from 02/01/2022 which showed normal LFTs.  Vitamin D is 46.  CBC was grossly normal. - Bilateral screening mammogram on  02/01/2022 with no evidence of malignancy. - She will come back in 1 year for follow-up.  We will also schedule MRI of the breast due to high risk at next visit.   2.  Erythrocytosis: - CBC on 02/01/2022 shows hemoglobin 15.5, hematocrit 46.9 and RBC count 5.13.  Hemoglobin and hematocrit were also slightly high in September 2023.  Prior to that she did not have any elevated hemoglobin. - She does not have any aquagenic pruritus or vasomotor symptoms.  Does not have sleep apnea.  Quit smoking at age 48, smoked for 12 years.  She is no longer on lisinopril/HCTZ. - We will monitor with next CBC in a year.  Will also check serum EPO level.  If any worsening, will consider checking JAK2 V617F mutation.     Breast Cancer therapy associated bone loss: I have recommended calcium, Vitamin D and weight bearing exercises.  Orders placed this encounter:  Orders Placed This Encounter  Procedures   Ambulatory referral to Social Work    The patient has a good understanding of the overall plan. She agrees with it. She will call with any problems that may develop before the next visit here.  Derek Jack, MD [place] [phone]

## 2022-02-07 NOTE — Patient Instructions (Addendum)
[place] at [facility] Discharge Instructions   You were seen and examined today by Dr. Delton Coombes.  He reviewed the results of you lab work which are normal.   He reviewed the results of your mammogram which was normal.   We will see you back in one year. We will repeat lab work and mammogram prior to your next visit.    Thank you for choosing [place] at [facility] to provide your oncology and hematology care.  To afford each patient quality time with our provider, please arrive at least 15 minutes before your scheduled appointment time.   If you have a lab appointment with the [facility] please come in thru the Main Entrance and check in at the main information desk.  You need to re-schedule your appointment should you arrive 10 or more minutes late.  We strive to give you quality time with our providers, and arriving late affects you and other patients whose appointments are after yours.  Also, if you no show three or more times for appointments you may be dismissed from the clinic at the providers discretion.     Again, thank you for choosing [facility].  Our hope is that these requests will decrease the amount of time that you wait before being seen by our physicians.       _____________________________________________________________  Should you have questions after your visit to [facility], please contact our office at [phone] and follow the prompts.  Our office hours are 8:00 a.m. and 4:30 p.m. Monday - Friday.  Please note that voicemails left after 4:00 p.m. may not be returned until the following business day.  We are closed weekends and major holidays.  You do have access to a nurse 24-7, just call the main number to the clinic [phone] and do not press any options, hold on the line and a nurse will answer the phone.    For prescription refill requests, have your pharmacy contact our  office and allow 72 hours.    Due to Covid, you will need to wear a mask upon entering the hospital. If you do not have a mask, a mask will be given to you at the Main Entrance upon arrival. For doctor visits, patients may have 1 support person age 85 or older with them. For treatment visits, patients can not have anyone with them due to social distancing guidelines and our immunocompromised population.

## 2022-02-14 ENCOUNTER — Inpatient Hospital Stay: Payer: Self-pay | Admitting: Licensed Clinical Social Worker

## 2022-02-26 ENCOUNTER — Other Ambulatory Visit: Payer: Self-pay | Admitting: Internal Medicine

## 2022-02-26 ENCOUNTER — Encounter: Payer: Self-pay | Admitting: Internal Medicine

## 2022-02-26 ENCOUNTER — Other Ambulatory Visit (HOSPITAL_COMMUNITY): Payer: Self-pay

## 2022-02-26 MED ORDER — WEGOVY 1.7 MG/0.75ML ~~LOC~~ SOAJ
1.7000 mg | SUBCUTANEOUS | 5 refills | Status: DC
  Filled 2022-02-26: qty 3, 28d supply, fill #0
  Filled 2022-05-04: qty 3, 28d supply, fill #1

## 2022-03-08 ENCOUNTER — Ambulatory Visit: Payer: 59 | Admitting: Internal Medicine

## 2022-03-11 ENCOUNTER — Telehealth: Payer: Commercial Managed Care - PPO | Admitting: Physician Assistant

## 2022-03-11 DIAGNOSIS — B9689 Other specified bacterial agents as the cause of diseases classified elsewhere: Secondary | ICD-10-CM | POA: Diagnosis not present

## 2022-03-11 DIAGNOSIS — J208 Acute bronchitis due to other specified organisms: Secondary | ICD-10-CM

## 2022-03-11 MED ORDER — PROMETHAZINE-DM 6.25-15 MG/5ML PO SYRP: 5.0000 mL | ORAL_SOLUTION | Freq: Four times a day (QID) | ORAL | 0 refills | Status: DC | PRN

## 2022-03-11 MED ORDER — DOXYCYCLINE HYCLATE 100 MG PO TABS: 100.0000 mg | ORAL_TABLET | Freq: Two times a day (BID) | ORAL | 0 refills | Status: DC

## 2022-03-11 NOTE — Progress Notes (Signed)
I have spent 5 minutes in review of e-visit questionnaire, review and updating patient chart, medical decision making and response to patient.   Shara Hartis Cody Tyana Butzer, PA-C    

## 2022-03-11 NOTE — Progress Notes (Signed)
We are sorry that you are not feeling well.  Here is how we plan to help!  Based on your presentation I believe you most likely have A cough due to bacteria.  When patients have a fever and a productive cough with a change in color or increased sputum production, we are concerned about bacterial bronchitis.  If left untreated it can progress to pneumonia.  If your symptoms do not improve with your treatment plan it is important that you contact your provider.   I have prescribed Doxycycline 100 mg twice a day for 7 days     In addition you may use A non-prescription cough medication called Mucinex DM: take 2 tablets every 12 hours. I have sent in a prescription cough syrup to use instead in the evening to help you rest better at night.   From your responses in the eVisit questionnaire you describe inflammation in the upper respiratory tract which is causing a significant cough.  This is commonly called Bronchitis and has four common causes:   Allergies Viral Infections Acid Reflux Bacterial Infection Allergies, viruses and acid reflux are treated by controlling symptoms or eliminating the cause. An example might be a cough caused by taking certain blood pressure medications. You stop the cough by changing the medication. Another example might be a cough caused by acid reflux. Controlling the reflux helps control the cough.  USE OF BRONCHODILATOR ("RESCUE") INHALERS: There is a risk from using your bronchodilator too frequently.  The risk is that over-reliance on a medication which only relaxes the muscles surrounding the breathing tubes can reduce the effectiveness of medications prescribed to reduce swelling and congestion of the tubes themselves.  Although you feel brief relief from the bronchodilator inhaler, your asthma may actually be worsening with the tubes becoming more swollen and filled with mucus.  This can delay other crucial treatments, such as oral steroid medications. If you need to use  a bronchodilator inhaler daily, several times per day, you should discuss this with your provider.  There are probably better treatments that could be used to keep your asthma under control.     HOME CARE Only take medications as instructed by your medical team. Complete the entire course of an antibiotic. Drink plenty of fluids and get plenty of rest. Avoid close contacts especially the very young and the elderly Cover your mouth if you cough or cough into your sleeve. Always remember to wash your hands A steam or ultrasonic humidifier can help congestion.   GET HELP RIGHT AWAY IF: You develop worsening fever. You become short of breath You cough up blood. Your symptoms persist after you have completed your treatment plan MAKE SURE YOU  Understand these instructions. Will watch your condition. Will get help right away if you are not doing well or get worse.    Thank you for choosing an e-visit.  Your e-visit answers were reviewed by a board certified advanced clinical practitioner to complete your personal care plan. Depending upon the condition, your plan could have included both over the counter or prescription medications.  Please review your pharmacy choice. Make sure the pharmacy is open so you can pick up prescription now. If there is a problem, you may contact your provider through [employer] and have the prescription routed to another pharmacy.  Your safety is important to [place]. If you have drug allergies check your prescription carefully.   For the next 24 hours you can use MyChart to ask questions about today's  visit, request a non-urgent call back, or ask for a work or school excuse. You will get an email in the next two days asking about your experience. I hope that your e-visit has been valuable and will speed your recovery.

## 2022-03-13 ENCOUNTER — Ambulatory Visit: Payer: Commercial Managed Care - PPO | Admitting: Internal Medicine

## 2022-03-13 ENCOUNTER — Encounter: Payer: Self-pay | Admitting: Internal Medicine

## 2022-03-13 VITALS — BP 122/86 | HR 94 | Ht 67.0 in | Wt 222.0 lb

## 2022-03-13 DIAGNOSIS — B9689 Other specified bacterial agents as the cause of diseases classified elsewhere: Secondary | ICD-10-CM | POA: Diagnosis not present

## 2022-03-13 DIAGNOSIS — Z853 Personal history of malignant neoplasm of breast: Secondary | ICD-10-CM | POA: Diagnosis not present

## 2022-03-13 DIAGNOSIS — J208 Acute bronchitis due to other specified organisms: Secondary | ICD-10-CM | POA: Diagnosis not present

## 2022-03-13 DIAGNOSIS — D751 Secondary polycythemia: Secondary | ICD-10-CM

## 2022-03-13 DIAGNOSIS — I10 Essential (primary) hypertension: Secondary | ICD-10-CM

## 2022-03-13 MED ORDER — METHYLPREDNISOLONE ACETATE 80 MG/ML IJ SUSP
80.0000 mg | Freq: Once | INTRAMUSCULAR | Status: AC
  Administered 2022-03-13: 80 mg via INTRAMUSCULAR

## 2022-03-13 MED ORDER — ALBUTEROL SULFATE HFA 108 (90 BASE) MCG/ACT IN AERS: 2.0000 | INHALATION_SPRAY | Freq: Four times a day (QID) | RESPIRATORY_TRACT | 0 refills | Status: DC | PRN

## 2022-03-13 MED ORDER — METHYLPREDNISOLONE 4 MG PO TBPK: ORAL_TABLET | ORAL | 0 refills | Status: DC

## 2022-03-13 NOTE — Assessment & Plan Note (Signed)
S/p lobectomy ER/PR positive Was on tamoxifen Followed by Oncology

## 2022-03-13 NOTE — Progress Notes (Signed)
Established Patient Office Visit  Subjective:  Patient ID: [name], female    DOB: 1970-04-30  Age: 52 y.o. MRN: [id number]  CC:  Chief Complaint  Patient presents with   Weight Management Screening    Weight management follow up. Patient is wondering If she has bronchitis, low grade fever and cough. She did an E visit and got antibiotics    HPI [name] is a 52 y.o. female with past medical history of HTN, recurrent breast cancer (ER/PR positive) s/p lumpectomy, anxiety, hot flashes and obesity who presents for f/u of her chronic medical conditions.  She complains of low-grade fever, cough, chest congestion and wheezing for the last 1 week after she came back from [place].  Her home COVID test was negative.  She did e-visit and was given doxycycline for acute bronchitis 2 days ago.  She has been taking promethazine DM as needed for cough.  Obesity: Her weight has been overall stable, compared to the last visit.  Of note, she had been having severe vomiting and bloating with Wegovy 2.4 mg dose.  Her dose was decreased to 1.7 mg, but she has not had it for the last 2 months.  Her nausea and vomiting have improved now.  She has also tried intermittent fasting, which was helping with weight loss.  Her BP is wnl now. Does not require antihypertensive.  Past Medical History:  Diagnosis Date   Breast cancer [facility]) 5732,2025   rt breast   Breast disorder    cancer right breast   Cancer ([place])    right lobular carcinoma in situ   Gastroparesis    controlled    Past Surgical History:  Procedure Laterality Date   BACK SURGERY  10/2002   lumbar hemilaminectomy, microdiscectomy L5-S1   BALLOON DILATION N/A 12/14/2021   Procedure: BALLOON DILATION;  Surgeon: Eloise Harman, DO;  [facility];  Service: Endoscopy;  Laterality: N/A;   BREAST BIOPSY Right 07/2016   malignant; waiting on MRI results   BREAST LUMPECTOMY Right 2012,2018   BREAST LUMPECTOMY WITH RADIOACTIVE  SEED LOCALIZATION Right 10/08/2016   Procedure: RIGHT BREAST LUMPECTOMY WITH RADIOACTIVE SEED LOCALIZATION;  Surgeon: Fanny Skates, MD;  Location: [place];  Service: General;  Laterality: Right;   BREAST SURGERY     COLONOSCOPY WITH PROPOFOL N/A 12/14/2021   Dr. Abbey Chatters: non-bleeding internal hemorrhoids, three 4-7 mm polyps (tubular and hyperplastic). Surveillance in 5 years   ESOPHAGOGASTRODUODENOSCOPY  08/2007   normal esophagus, large gastric diverticulum, patent pylorus   ESOPHAGOGASTRODUODENOSCOPY (EGD) WITH PROPOFOL N/A 12/14/2021   3 cm hiatal hernia, gastric diverticulum, four gastric fundic gland polyps, empiric dilation   POLYPECTOMY  12/14/2021   Procedure: POLYPECTOMY;  Surgeon: Eloise Harman, DO;  [facility];  Service: Endoscopy;;   TUBAL LIGATION  2001    Family History  Problem Relation Age of Onset   Hypertension Mother    Hyperlipidemia Mother    Diverticulitis Mother    Hernia Mother    Heart disease Father        arrythmia   Hypertension Father    Arthritis Brother    Heart attack Maternal Grandmother    Aneurysm Maternal Grandfather    Congestive Heart Failure Paternal Grandmother    Cancer Maternal Uncle        pancreatic   Colon cancer Neg Hx    Colon polyps Neg Hx     Social History   Socioeconomic History  Marital status: Married    Spouse name: Not on file   Number of children: Not on file   Years of education: Not on file   Highest education level: Not on file  Occupational History   Not on file  Tobacco Use   Smoking status: Former    Packs/day: 1.00    Years: 16.00    Total pack years: 16.00    Types: Cigarettes    Quit date: 08/12/2000    Years since quitting: 21.5   Smokeless tobacco: Never   Tobacco comments:    quit 10 yrs  Vaping Use   Vaping Use: Never used  Substance and Sexual Activity   Alcohol use: Yes    Comment: occasionally   Drug use: No   Sexual activity: Yes    Birth  control/protection: Surgical    Comment: tubal  Other Topics Concern   Not on file  Social History Narrative   Not on file   Social Determinants of Health   Financial Resource Strain: Low Risk  (09/08/2020)   Overall Financial Resource Strain (CARDIA)    Difficulty of Paying Living Expenses: Not hard at all  Food Insecurity: No Food Insecurity (09/08/2020)   Hunger Vital Sign    Worried About Running Out of Food in the Last Year: Never true    [place] in the Last Year: Never true  Transportation Needs: No Transportation Needs (09/08/2020)   PRAPARE - Hydrologist (Medical): No    Lack of Transportation (Non-Medical): No  Physical Activity: Insufficiently Active (09/08/2020)   Exercise Vital Sign    Days of Exercise per Week: 1 day    Minutes of Exercise per Session: 30 min  Stress: No Stress Concern Present (09/08/2020)   Agenda    Feeling of Stress : Only a little  Social Connections: Moderately Integrated (09/08/2020)   Social Connection and Isolation Panel [NHANES]    Frequency of Communication with Friends and Family: More than three times a week    Frequency of Social Gatherings with Friends and Family: Three times a week    Attends Religious Services: Never    Active Member of Clubs or Organizations: Yes    Attends Archivist Meetings: More than 4 times per year    Marital Status: Married  Human resources officer Violence: Not At Risk (09/08/2020)   Humiliation, Afraid, Rape, and Kick questionnaire    Fear of Current or Ex-Partner: No    Emotionally Abused: No    Physically Abused: No    Sexually Abused: No    Outpatient Medications Prior to Visit  Medication Sig Dispense Refill   doxycycline (VIBRA-TABS) 100 MG tablet Take 1 tablet (100 mg total) by mouth 2 (two) times daily. 14 tablet 0   ibuprofen (ADVIL) 200 MG tablet Take 800 mg by mouth every 8 (eight) hours  as needed for moderate pain.     pantoprazole (PROTONIX) 40 MG tablet Take 1 tablet (40 mg total) by mouth 2 (two) times daily. (Patient not taking: Reported on 02/07/2022) 60 tablet 11   promethazine-dextromethorphan (PROMETHAZINE-DM) 6.25-15 MG/5ML syrup Take 5 mLs by mouth 4 (four) times daily as needed for cough. 118 mL 0   Semaglutide-Weight Management (WEGOVY) 1.7 MG/0.75ML SOAJ Inject 1.7 mg into the skin every 7 (seven) days. (Patient not taking: Reported on 03/13/2022) 3 mL 5   No facility-administered medications prior to visit.    Allergies  Allergen Reactions   Augmentin [Amoxicillin-Pot Clavulanate] Hives    ROS Review of Systems  Constitutional:  Positive for chills and fever.  HENT:  Positive for congestion, postnasal drip and sore throat.   Eyes:  Negative for pain and discharge.  Respiratory:  Positive for cough, shortness of breath and wheezing.   Cardiovascular:  Negative for chest pain and palpitations.  Gastrointestinal:  Negative for constipation, diarrhea, nausea and vomiting.  Endocrine: Negative for polydipsia and polyuria.  Genitourinary:  Negative for dysuria and hematuria.  Musculoskeletal:  Negative for neck pain and neck stiffness.  Skin:        Mole over right shoulder area  Neurological:  Negative for dizziness and weakness.  Psychiatric/Behavioral:  Negative for agitation and behavioral problems.       Objective:    Physical Exam Vitals reviewed.  Constitutional:      General: She is not in acute distress.    Appearance: She is obese. She is not diaphoretic.  HENT:     Head: Normocephalic and atraumatic.     Nose: Nose normal.     Mouth/Throat:     Mouth: Mucous membranes are moist.  Eyes:     General: No scleral icterus.    Extraocular Movements: Extraocular movements intact.  Cardiovascular:     Rate and Rhythm: Normal rate and regular rhythm.     Pulses: Normal pulses.     Heart sounds: Normal heart sounds. No murmur  heard. Pulmonary:     Breath sounds: Normal breath sounds. No wheezing or rales.  Musculoskeletal:     Cervical back: Neck supple. No tenderness.     Right lower leg: No edema.     Left lower leg: No edema.  Skin:    General: Skin is warm.     Findings: No rash.     Comments: Brownish papule over right shoulder area -oval-shaped  Neurological:     General: No focal deficit present.     Mental Status: She is alert and oriented to person, place, and time.     Cranial Nerves: No cranial nerve deficit.     Sensory: No sensory deficit.     Motor: No weakness.  Psychiatric:        Mood and Affect: Mood normal.        Behavior: Behavior normal.     BP 122/86 (BP Location: Left Arm, Patient Position: Sitting, Cuff Size: Normal)   Pulse 94   Ht '5\' 7"'$  (1.702 m)   Wt 222 lb (100.7 kg)   SpO2 98%   BMI 34.77 kg/m  Wt Readings from Last 3 Encounters:  03/13/22 222 lb (100.7 kg)  02/07/22 217 lb 11.2 oz (98.7 kg)  01/22/22 216 lb 11.2 oz (98.3 kg)    Lab Results  Component Value Date   TSH 1.240 09/06/2021   Lab Results  Component Value Date   WBC 5.4 02/01/2022   HGB 15.5 (H) 02/01/2022   HCT 46.9 (H) 02/01/2022   MCV 91.4 02/01/2022   PLT 213 02/01/2022   Lab Results  Component Value Date   NA 137 02/01/2022   K 3.9 02/01/2022   CO2 23 02/01/2022   GLUCOSE 106 (H) 02/01/2022   BUN 14 02/01/2022   CREATININE 1.00 02/01/2022   BILITOT 0.6 02/01/2022   ALKPHOS 64 02/01/2022   AST 25 02/01/2022   ALT 26 02/01/2022   PROT 7.1 02/01/2022   ALBUMIN 3.7 02/01/2022   CALCIUM 9.0 02/01/2022   ANIONGAP 5 02/01/2022  EGFR 71 09/06/2021   Lab Results  Component Value Date   CHOL 215 (H) 09/06/2021   Lab Results  Component Value Date   HDL 53 09/06/2021   Lab Results  Component Value Date   LDLCALC 146 (H) 09/06/2021   Lab Results  Component Value Date   TRIG 91 09/06/2021   Lab Results  Component Value Date   CHOLHDL 4.1 09/06/2021   Lab Results   Component Value Date   HGBA1C 5.0 09/06/2021      Assessment & Plan:   Problem List Items Addressed This Visit       Cardiovascular and Mediastinum   Hypertension - Primary    BP Readings from Last 1 Encounters:  03/13/22 122/86  Well-controlled now with diet alone Was on Lisinopril-HCTZ Advised DASH diet and moderate exercise/walking, at least 150 mins/week        Respiratory   Acute bacterial bronchitis    Cough, dyspnea and wheezing likely due to acute bronchitis On doxycycline for bacterial coverage Solu-Medrol IM today Medrol Dosepak Albuterol PRN for dyspnea or wheezing      Relevant Medications   methylPREDNISolone (MEDROL DOSEPAK) 4 MG TBPK tablet   albuterol (VENTOLIN HFA) 108 (90 Base) MCG/ACT inhaler     Other   History of right breast cancer    S/p lobectomy ER/PR positive Was on tamoxifen Followed by Oncology      Morbid obesity ([place])    BMI Readings from Last 2 Encounters:  03/13/22 34.77 kg/m  02/07/22 34.10 kg/m   Had lost 15 lbs with Wegovy, but had severe vomiting Decreased dose of Wegovy to 1.7 mg qw Has tried diet modification - low carb diet and regular exercise regimen She has obesity related complications - prediabetes, HLD, HTN and GERD On Wegovy, initial BMI - 37.75      Erythrocytosis    She does not have any aquagenic pruritus or vasomotor symptoms.  Does not have sleep apnea.  Quit smoking at age 74, smoked for 12 years.  She is no longer on lisinopril/HCTZ.  Followed by Hematology       Meds ordered this encounter  Medications   methylPREDNISolone (MEDROL DOSEPAK) 4 MG TBPK tablet    Sig: Take as package instructions.    Dispense:  1 each    Refill:  0   albuterol (VENTOLIN HFA) 108 (90 Base) MCG/ACT inhaler    Sig: Inhale 2 puffs into the lungs every 6 (six) hours as needed for wheezing or shortness of breath.    Dispense:  8 g    Refill:  0    Okay to substitute to generic/formulary Albuterol.    methylPREDNISolone acetate (DEPO-MEDROL) injection 80 mg    Follow-up: Return in about 6 months (around 09/11/2022) for Annual physical.    Lindell Spar, MD

## 2022-03-13 NOTE — Assessment & Plan Note (Signed)
BMI Readings from Last 2 Encounters:  03/13/22 34.77 kg/m  02/07/22 34.10 kg/m    Had lost 15 lbs with Wegovy, but had severe vomiting Decreased dose of Wegovy to 1.7 mg qw Has tried diet modification - low carb diet and regular exercise regimen She has obesity related complications - prediabetes, HLD, HTN and GERD On Wegovy, initial BMI - 37.75

## 2022-03-13 NOTE — Patient Instructions (Signed)
Please start taking Prednisone as prescribed.  Please continue to take other medications as prescribed.  Please continue to follow low carb diet and perform moderate exercise/walking at least 150 mins/week.

## 2022-03-13 NOTE — Assessment & Plan Note (Signed)
She does not have any aquagenic pruritus or vasomotor symptoms.  Does not have sleep apnea.  Quit smoking at age 52, smoked for 12 years.  She is no longer on lisinopril/HCTZ.  Followed by Hematology

## 2022-03-13 NOTE — Assessment & Plan Note (Addendum)
BP Readings from Last 1 Encounters:  03/13/22 122/86   Well-controlled now with diet alone Was on Lisinopril-HCTZ Advised DASH diet and moderate exercise/walking, at least 150 mins/week

## 2022-03-13 NOTE — Assessment & Plan Note (Signed)
Cough, dyspnea and wheezing likely due to acute bronchitis On doxycycline for bacterial coverage Solu-Medrol IM today Medrol Dosepak Albuterol PRN for dyspnea or wheezing

## 2022-04-04 ENCOUNTER — Other Ambulatory Visit (HOSPITAL_COMMUNITY): Payer: Self-pay

## 2022-04-11 ENCOUNTER — Encounter: Payer: Self-pay | Admitting: Radiology

## 2022-04-12 ENCOUNTER — Other Ambulatory Visit (HOSPITAL_COMMUNITY): Payer: Self-pay

## 2022-04-17 ENCOUNTER — Other Ambulatory Visit (HOSPITAL_COMMUNITY): Payer: Self-pay

## 2022-05-04 ENCOUNTER — Telehealth: Payer: Commercial Managed Care - PPO | Admitting: Nurse Practitioner

## 2022-05-04 DIAGNOSIS — S70362D Insect bite (nonvenomous), left thigh, subsequent encounter: Secondary | ICD-10-CM

## 2022-05-04 DIAGNOSIS — W57XXXD Bitten or stung by nonvenomous insect and other nonvenomous arthropods, subsequent encounter: Secondary | ICD-10-CM

## 2022-05-04 NOTE — Progress Notes (Signed)
Hello [name],  At this time you would not meet the criteria for antibiotic as it has been over 72 hours since the tick was removed and the tick would have needed to be engorged and on your body part for at least 36 hours. Also is there a rash present? You would need to have labs drawn to check for certain antibodies if your PCP feels your symptoms mimic any lyme disease or tick born illness.   I would recommend tylenol and motrin for fever and if symptoms worsen you would need to be seen in person: urgent care, PCP, ER, etc.  E-Visit for Tick Bite  Thank you for describing your tick bite, Here is how we plan to help! Based on the information that you shared with me it looks like you have An uncomplicated tick bite that just occurred and can be closely follow using the instructions in your care plan.  In most cases a tick bite is painless and does not itch.  Most tick bites in which the tick is quickly removed do not require prescriptions. Ticks can transmit several diseases if they are infected and remain attacked to your skin. Therefore the length that the tick was attached and any symptoms you have experienced after the bite are import to accurately develop your custom treatment plan. In most cases a single dose of doxycycline within 3 days of tick removal may prevent the development of a more serious condition.  Which ticks  are associated with illness?  The Wood Tick (dog tick) is the size of a watermelon seed and can sometimes transmit [facility] spotted fever and [place] tick fever.   The Deer Tick (black-legged tick) is between the size of a poppy seed (pin head) and an apple seed, and can sometimes transmit Lyme disease.  A brown to black tick with a white splotch on its back is likely a female Amblyomma americanum ([place] tick). This tick has been associated with [place] Tick Associated illness ( STARI)  Lyme disease has become the most common tick-borne illness in the  [place]. The risk of Lyme disease following a recognized deer tick bite is estimated to be 1%.  The majority of cases of Lyme disease start with a bull's eye rash at the site of the tick bite. The rash can occur days to weeks (typically 7-10 days) after a tick bite. Treatment with antibiotics is indicated if this rash appears. Flu-like symptoms may accompany the rash, including: fever, chills, headaches, muscle aches, and fatigue. Removing ticks promptly may prevent tick borne disease.  What can be used to prevent Tick Bites?  Insect repellant with at leas 20% DEET. Wearing long pants with sock and shoes. Avoiding tall grass and heavily wooded areas. Checking your skin after being outdoors. Shower with a washcloth after outdoor exposures.  HOME CARE ADVICE FOR TICK BITE  Wood Tick Removal:  Use a pair of tweezers and grasp the wood tick close to the skin (on its head). Pull the wood tick straight upward without twisting or crushing it. Maintain a steady pressure until it releases its grip.   If tweezers aren't available, use fingers, a loop of thread around the jaws, or a needle between the jaws for traction.  Note: covering the tick with petroleum jelly, nail polish or rubbing alcohol doesn't work. Neither does touching the tick with a hot or cold object. Tiny Deer Tick Removal:   Needs to be scraped off with a knife blade or credit  card edge. Place tick in a sealed container (e.g. glass jar, zip lock plastic bag), in case your doctor wants to see it. Tick's Head Removal:  If the wood tick's head breaks off in the skin, it must be removed. Clean the skin. Then use a sterile needle to uncover the head and lift it out or scrape it off.  If a very small piece of the head remains, the skin will eventually slough it off. Antibiotic Ointment:  Wash the wound and your hands with soap and water after removal to prevent catching any tick disease.  Apply an over the counter antibiotic ointment  (e.g. bacitracin) to the bite once. Expected Course: Tick bites normally don't itch or hurt. That's why they often go unnoticed. Call Your Doctor If:  You can't remove the tick or the tick's head Fever, a severe head ache, or rash occur in the next 2 weeks Bite begins to look infected Lyme's disease is common in your area You have not had a tetanus in the last 10 years Your current symptoms become worse    MAKE SURE YOU  Understand these instructions. Will watch your condition. Will get help right away if you are not doing well or get worse.    Thank you for choosing an e-visit.  Your e-visit answers were reviewed by a board certified advanced clinical practitioner to complete your personal care plan. Depending upon the condition, your plan could have included both over the counter or prescription medications.  Please review your pharmacy choice. Make sure the pharmacy is open so you can pick up prescription now. If there is a problem, you may contact your provider through [employer] and have the prescription routed to another pharmacy.  Your safety is important to [place]. If you have drug allergies check your prescription carefully.   For the next 24 hours you can use MyChart to ask questions about today's visit, request a non-urgent call back, or ask for a work or school excuse. You will get an email in the next two days asking about your experience. I hope that your e-visit has been valuable and will speed your recovery.

## 2022-05-04 NOTE — Progress Notes (Signed)
I have spent 5 minutes in review of e-visit questionnaire, review and updating patient chart, medical decision making and response to patient.  ° °Maridee Slape W Korben Carcione, NP ° °  °

## 2022-05-05 ENCOUNTER — Other Ambulatory Visit: Payer: Self-pay

## 2022-05-06 ENCOUNTER — Other Ambulatory Visit: Payer: Self-pay

## 2022-05-06 ENCOUNTER — Encounter: Payer: Self-pay | Admitting: Family Medicine

## 2022-05-06 ENCOUNTER — Ambulatory Visit
Admission: RE | Admit: 2022-05-06 | Discharge: 2022-05-06 | Disposition: A | Discharge status: Home / Self Care | Payer: Commercial Managed Care - PPO | Source: Ambulatory Visit | Attending: Family Medicine | Admitting: Family Medicine

## 2022-05-06 ENCOUNTER — Ambulatory Visit
Admission: RE | Admit: 2022-05-06 | Discharge: 2022-05-06 | Disposition: A | Discharge status: Home / Self Care | Payer: Commercial Managed Care - PPO | Attending: Family Medicine | Admitting: Family Medicine

## 2022-05-06 ENCOUNTER — Other Ambulatory Visit (HOSPITAL_COMMUNITY): Payer: Self-pay

## 2022-05-06 ENCOUNTER — Telehealth: Payer: Commercial Managed Care - PPO | Admitting: Family Medicine

## 2022-05-06 DIAGNOSIS — R059 Cough, unspecified: Secondary | ICD-10-CM | POA: Insufficient documentation

## 2022-05-06 DIAGNOSIS — B9689 Other specified bacterial agents as the cause of diseases classified elsewhere: Secondary | ICD-10-CM

## 2022-05-06 DIAGNOSIS — R509 Fever, unspecified: Secondary | ICD-10-CM | POA: Diagnosis not present

## 2022-05-06 DIAGNOSIS — J208 Acute bronchitis due to other specified organisms: Secondary | ICD-10-CM

## 2022-05-06 MED ORDER — METHYLPREDNISOLONE 4 MG PO TBPK
ORAL_TABLET | ORAL | 0 refills | Status: DC
  Filled 2022-05-06: qty 21, 6d supply, fill #0

## 2022-05-06 NOTE — Progress Notes (Signed)
   Virtual Visit via Video Note  I connected with [name] on 05/06/22 at 10:20 AM EDT by a video enabled telemedicine application and verified that I am speaking with the correct person using two identifiers.  Patient Location: Home Provider Location: Home Office  I discussed the limitations, risks, security, and privacy concerns of performing an evaluation and management service by video and the availability of in person appointments. I also discussed with the patient that there may be a patient responsible charge related to this service. The patient expressed understanding and agreed to proceed.  Subjective: PCP: Lindell Spar, MD  Chief Complaint  Patient presents with   URI    Pt reports having sx of a cough, low grade fever, wheezing on right lung, fatigue, would like to have a chest xray, sx started 05/01/22.    HPI The patient is in today with complaints of cough and wheezing since 05/01/2022.  She reports low-grade fever and audible crackles when she coughs.  She has been taking Motrin for body aches.  No other symptoms reported.  Of note, the patient was treated on 03/13/2022 for acute bronchitis with doxycycline and Medrol Dosepak.     ROS: Per HPI  Current Outpatient Medications:    albuterol (VENTOLIN HFA) 108 (90 Base) MCG/ACT inhaler, Inhale 2 puffs into the lungs every 6 (six) hours as needed for wheezing or shortness of breath., Disp: 8 g, Rfl: 0   doxycycline (VIBRA-TABS) 100 MG tablet, Take 1 tablet (100 mg total) by mouth 2 (two) times daily., Disp: 14 tablet, Rfl: 0   ibuprofen (ADVIL) 200 MG tablet, Take 800 mg by mouth every 8 (eight) hours as needed for moderate pain., Disp: , Rfl:    methylPREDNISolone (MEDROL DOSEPAK) 4 MG TBPK tablet, Take as package instructions., Disp: 1 each, Rfl: 0   pantoprazole (PROTONIX) 40 MG tablet, Take 1 tablet (40 mg total) by mouth 2 (two) times daily., Disp: 60 tablet, Rfl: 11   promethazine-dextromethorphan (PROMETHAZINE-DM)  6.25-15 MG/5ML syrup, Take 5 mLs by mouth 4 (four) times daily as needed for cough., Disp: 118 mL, Rfl: 0   Semaglutide-Weight Management (WEGOVY) 1.7 MG/0.75ML SOAJ, Inject 1.7 mg into the skin every 7 (seven) days., Disp: 3 mL, Rfl: 5  Observations/Objective: There were no vitals filed for this visit. Physical Exam Patient is well-developed, well-nourished in no acute distress.  Resting comfortably at home.  Head is normocephalic, atraumatic.  No labored breathing.  Speech is clear and coherent with logical content.  Patient is alert and oriented at baseline.   Assessment and Plan: Cough with fever  Acute bacterial bronchitis  Will get a chest x-ray to rule out pneumonia Will hold off on antibiotics currently until her chest x-ray results are in Medrol Dosepak order and will treat for acute bronchitis Encouraged to continue symptomatic treatments over-the-counter  Follow Up Instructions: No follow-ups on file. Encouraged to follow-up for worsening of symptoms I discussed the assessment and treatment plan with the patient. The patient was provided an opportunity to ask questions, and all were answered. The patient agreed with the plan and demonstrated an understanding of the instructions.   The patient was advised to call back or seek an in-person evaluation if the symptoms worsen or if the condition fails to improve as anticipated.  The above assessment and management plan was discussed with the patient. The patient verbalized understanding of and has agreed to the management plan.   Alvira Monday, FNP

## 2022-05-13 ENCOUNTER — Encounter: Payer: Self-pay | Admitting: Family Medicine

## 2022-05-13 ENCOUNTER — Other Ambulatory Visit: Payer: Self-pay | Admitting: Internal Medicine

## 2022-05-13 DIAGNOSIS — B9689 Other specified bacterial agents as the cause of diseases classified elsewhere: Secondary | ICD-10-CM

## 2022-05-13 MED ORDER — AZITHROMYCIN 250 MG PO TABS: ORAL_TABLET | ORAL | 0 refills | Status: DC

## 2022-05-13 MED ORDER — AZITHROMYCIN 250 MG PO TABS: ORAL_TABLET | ORAL | 0 refills | Status: AC

## 2022-05-13 NOTE — Addendum Note (Signed)
Addended byIhor Dow on: 05/13/2022 06:03 PM   Modules accepted: Orders

## 2022-09-11 ENCOUNTER — Encounter: Payer: Commercial Managed Care - PPO | Admitting: Internal Medicine

## 2023-01-23 ENCOUNTER — Encounter: Payer: Self-pay | Admitting: Orthopaedic Surgery

## 2023-01-23 ENCOUNTER — Other Ambulatory Visit (INDEPENDENT_AMBULATORY_CARE_PROVIDER_SITE_OTHER): Payer: Commercial Managed Care - PPO

## 2023-01-23 ENCOUNTER — Other Ambulatory Visit (HOSPITAL_COMMUNITY): Payer: Self-pay

## 2023-01-23 ENCOUNTER — Ambulatory Visit: Payer: Commercial Managed Care - PPO | Admitting: Orthopaedic Surgery

## 2023-01-23 VITALS — Ht 67.0 in | Wt 237.0 lb

## 2023-01-23 DIAGNOSIS — M5442 Lumbago with sciatica, left side: Secondary | ICD-10-CM | POA: Diagnosis not present

## 2023-01-23 DIAGNOSIS — M79605 Pain in left leg: Secondary | ICD-10-CM

## 2023-01-23 DIAGNOSIS — M545 Low back pain, unspecified: Secondary | ICD-10-CM

## 2023-01-23 DIAGNOSIS — M25552 Pain in left hip: Secondary | ICD-10-CM | POA: Diagnosis not present

## 2023-01-23 MED ORDER — CYCLOBENZAPRINE HCL 10 MG PO TABS
10.0000 mg | ORAL_TABLET | Freq: Every day | ORAL | 0 refills | Status: DC
  Filled 2023-01-23: qty 30, 30d supply, fill #0

## 2023-01-23 NOTE — Progress Notes (Signed)
My left hip and back hurt.  She was helping lift her mother earlier in the week and began having pain of the left hip and leg. This has gotten worse.  She has pain going past the left knee to the lower leg.  She feels weaker on the left left.  Pain runs by her hip and hurts deeply at times.  She has no other trauma, no bowel or bladder problems.  ROM of the left hip and knee is full.  ROM of the back is good but tender.  She is tender more on the left lower back without spasm.  NV intact.  Gait is a slight limp to the left.  Toe/heel gait is good.  X-rays were done of the lumbar spine, reported separately.  Encounter Diagnosis  Name Primary?   Lumbar pain with radiation down left leg Yes   I feel she has more of a strain of the back.  I will begin PT.  She will take ibuprofen 600 three to four times a day.  I will call in Flexeril.  Return in one month.  Call if any problem.  Precautions discussed.  Electronically Signed Darreld Mclean, MD 12/12/202410:22 AM

## 2023-01-28 ENCOUNTER — Encounter: Payer: Self-pay | Admitting: Orthopaedic Surgery

## 2023-01-29 ENCOUNTER — Ambulatory Visit (HOSPITAL_COMMUNITY): Payer: Commercial Managed Care - PPO | Attending: Orthopaedic Surgery | Admitting: Physical Therapy

## 2023-01-29 ENCOUNTER — Other Ambulatory Visit: Payer: Self-pay

## 2023-01-29 DIAGNOSIS — M545 Low back pain, unspecified: Secondary | ICD-10-CM | POA: Insufficient documentation

## 2023-01-29 DIAGNOSIS — M5416 Radiculopathy, lumbar region: Secondary | ICD-10-CM | POA: Insufficient documentation

## 2023-01-29 DIAGNOSIS — M79605 Pain in left leg: Secondary | ICD-10-CM | POA: Insufficient documentation

## 2023-01-29 NOTE — Therapy (Signed)
OUTPATIENT PHYSICAL THERAPY THORACOLUMBAR EVALUATION   Patient Name: [name] MRN: [id number] DOB:14-Nov-1970, 52 y.o., female Today's Date: 01/29/2023  END OF SESSION:  PT End of Session - 01/29/23 1709     Visit Number 1    Number of Visits 8    Date for PT Re-Evaluation 02/28/23    Authorization Type [place]    Progress Note Due on Visit 8    PT Start Time 1515    PT Stop Time 1600    PT Time Calculation (min) 45 min    Activity Tolerance Patient tolerated treatment well    Behavior During Therapy [facility] for tasks assessed/performed             Past Medical History:  Diagnosis Date   Breast cancer (HCC) 2012,2018   rt breast   Breast disorder    cancer right breast   Cancer (HCC)    right lobular carcinoma in situ   Gastroparesis    controlled   Past Surgical History:  Procedure Laterality Date   BACK SURGERY  10/2002   lumbar hemilaminectomy, microdiscectomy L5-S1   BALLOON DILATION N/A 12/14/2021   Procedure: BALLOON DILATION;  Surgeon: Lanelle Bal, DO;  [facility];  Service: Endoscopy;  Laterality: N/A;   BREAST BIOPSY Right 07/2016   malignant; waiting on MRI results   BREAST LUMPECTOMY Right 2012,2018   BREAST LUMPECTOMY WITH RADIOACTIVE SEED LOCALIZATION Right 10/08/2016   Procedure: RIGHT BREAST LUMPECTOMY WITH RADIOACTIVE SEED LOCALIZATION;  Surgeon: Claud Kelp, MD;  Location:  SURGERY CENTER;  Service: General;  Laterality: Right;   BREAST SURGERY     COLONOSCOPY WITH PROPOFOL N/A 12/14/2021   Dr. Marletta Lor: non-bleeding internal hemorrhoids, three 4-7 mm polyps (tubular and hyperplastic). Surveillance in 5 years   ESOPHAGOGASTRODUODENOSCOPY  08/2007   normal esophagus, large gastric diverticulum, patent pylorus   ESOPHAGOGASTRODUODENOSCOPY (EGD) WITH PROPOFOL N/A 12/14/2021   3 cm hiatal hernia, gastric diverticulum, four gastric fundic gland polyps, empiric dilation   POLYPECTOMY  12/14/2021   Procedure:  POLYPECTOMY;  Surgeon: Lanelle Bal, DO;  [facility];  Service: Endoscopy;;   TUBAL LIGATION  2001   Patient Active Problem List   Diagnosis Date Noted   Acute bacterial bronchitis 03/13/2022   Erythrocytosis 03/13/2022   Dysphagia 11/13/2021   Hepatic steatosis 11/13/2021   Encounter for general adult medical examination with abnormal findings 09/06/2021   Seborrheic keratosis 05/30/2021   HLD (hyperlipidemia) 03/01/2021   Gastroesophageal reflux disease without esophagitis 03/01/2021   Morbid obesity (HCC) 03/01/2021   Prediabetes 10/26/2020   Peri-menopause 09/08/2020   Hypertension 06/12/2020   Anxiety 04/09/2019   History of right breast cancer 09/02/2016   Recurrent breast cancer, right (HCC) 09/02/2016   SUI (stress urinary incontinence, female) 09/02/2016   Lobular carcinoma in situ of right breast 12/03/2010    PCP: Trena Platt  REFERRING PROVIDER: Darreld Mclean   REFERRING DIAG:  Diagnosis  M54.50,M79.605 (ICD-10-CM) - Lumbar pain with radiation down left leg    Rationale for Evaluation and Treatment: Rehabilitation  THERAPY DIAG:  Diagnosis  M54.50,M79.605 (ICD-10-CM) - Lumbar pain with radiation down left leg    ONSET DATE: 12/8  SUBJECTIVE:  SUBJECTIVE STATEMENT: Pt states that she stepped up onto her porch and her Lt knee popped and now it is numb.   Then a couple of weeks later she  was assisting her mother move felt increased pain in her back followed by radiating pain down her left leg to below the knee level.  PT is waking 1-2 x per night.  Standing is difficult almost immediately wants to sit down.  Walking continuously is better, sitting is good.  PERTINENT HISTORY:  Micro-disectomy in lumbar area  PAIN:  Are you having pain? Yes: 7 Pain  location: back down past  left knee  Pain description: aching, numb  Aggravating factors: walking Relieving factors: TENS  PRECAUTIONS: None WEIGHT BEARING RESTRICTIONS: No  FALLS:  Has patient fallen in last 6 months? No  LIVING ENVIRONMENT: Lives with: lives with their family Lives in: House/apartment Stairs: Yes: Internal: 10 steps; on right going up can not lead with her left leg at this time. Has following equipment at home: None  OCCUPATION: PT experience   PLOF: Independent  PATIENT GOALS: less pain   NEXT MD VISIT: after therapy   OBJECTIVE:  Note: Objective measures were completed at Evaluation unless otherwise noted.  DIAGNOSTIC FINDINGS:  Impression: negative lumbar spine, no acute findings.  COGNITION: Overall cognitive status: Within functional limits for tasks assessed    Lt  hamstring 150 ; Rt 165  LUMBAR ROM:   AROM eval  Flexion Able to touch toes reps no change  Extension 28; reps improves   Right lateral flexion   Left lateral flexion   Right rotation   Left rotation    (Blank rows = not tested)   LOWER EXTREMITY MMT:    MMT Right eval Left eval  Hip flexion 5 3+  Hip extension 3 3  Hip abduction 4+ 4  Hip adduction    Hip internal rotation    Hip external rotation    Knee flexion 5 3+  Knee extension 5 4  Ankle dorsiflexion 5 5  Ankle plantarflexion    Ankle inversion    Ankle eversion     (Blank rows = not tested)  FUNCTIONAL TESTS:  30 seconds chair stand test:12 is poor for age and sex, 15 below average 18 average  2 minute walk test: 500 ft  Single leg stance: RT: 60"   LT:  60"   TREATMENT DATE: 01/29/23: Evaluation        Standing Lumbar Extension  - 10 reps  Seated Long Arc Quad  10 reps - 5second hold  Seated Transversus Abdominis Bracing   10 reps - 5 hold  Prone Press Up   5- 2" hold supine active LT hamstring stretch x 30" x 2                                                                                                                           PATIENT EDUCATION:  Education details: HEP Person educated: Patient Education  method: Explanation and Handouts Education comprehension: verbalized understanding  HOME EXERCISE PROGRAM: Access Code: [email or web address] URL: [email or web address] Date: 01/29/2023 Prepared by: Virgina Organ  Exercises - Standing Lumbar Extension  - 5 x daily - 7 x weekly - 1 sets - 5-10 reps - Seated Long Arc Quad  - 3 x daily - 7 x weekly - 1 sets - 10 reps - 5second s hold - Seated Transversus Abdominis Bracing  - 5 x daily - 7 x weekly - 1 sets - 10 reps - 5 hold - Prone Press Up  - 2 x daily - 7 x weekly - 1 sets - 5-10 reps - 2" hold -supine active hamstring stretch x 30"  x 3 2 x daily  ASSESSMENT:  CLINICAL IMPRESSION: Patient is a 52 y.o. female who was seen today for physical therapy evaluation and treatment for lumbar radiculopathy.  Evaluation demonstrates decreased activity tolerance, decreased ROM, decreased strength and increased pain.  Ms. Langel will benefit from skilled PT to address these deficits and maximize her functional ability. .   OBJECTIVE IMPAIRMENTS: decreased activity tolerance, decreased balance, decreased ROM, decreased strength, and pain.   ACTIVITY LIMITATIONS: bending, sitting, standing, and locomotion level  PARTICIPATION LIMITATIONS: cleaning, driving, shopping, community activity, and occupation  REHAB POTENTIAL: Good  CLINICAL DECISION MAKING: Evolving/moderate complexity  EVALUATION COMPLEXITY: Moderate   GOALS: Goals reviewed with patient? No  SHORT TERM GOALS: Target date: 02/12/22  PT to be I in HEP in order to decrease her pain to no greater than a 5 Baseline: Goal status: INITIAL  2.  Pt radicular sx to be no further than her mid thigh to demonstrate decreased nerve irritation  Baseline:  Goal status: INITIAL  3.  PT strength to increase 1/2 grade to allow pt to go up and down 10 steps in a reciprocal  manner. Baseline:  Goal status: INITIAL   LONG TERM GOALS: Target date: 02/27/23  PT to be I in an advanced HEP in order to decrease her pain to no greater than a 2 Baseline:  Goal status: INITIAL  2.  PT to have no radicular sx  Baseline:  Goal status: INITIAL  3.  PT mm strength to be increased one grade to be able to squat and return from a squatted position for housework.  Baseline:  Goal status: INITIAL  4.  PT to be able to complete 18 sit to stand in 30 seconds to be in the average range for sex and age.  Baseline:  Goal status: INITIAL  PLAN:  PT FREQUENCY: 2x/week  PT DURATION: 4 weeks  PLANNED INTERVENTIONS: 97110-Therapeutic exercises, 97530- Therapeutic activity, 97535- Self Care, 27253- Manual therapy, and Patient/Family education.  PLAN FOR NEXT SESSION: Continue with stabilization for strengthening of core and LE, body mechanic instruction and extension based exercises.   Virgina Organ, PT CLT [phone]  01/29/2023, 5:10 PM

## 2023-01-31 ENCOUNTER — Ambulatory Visit (HOSPITAL_COMMUNITY): Payer: Commercial Managed Care - PPO | Admitting: Physical Therapy

## 2023-01-31 DIAGNOSIS — M5416 Radiculopathy, lumbar region: Secondary | ICD-10-CM | POA: Diagnosis not present

## 2023-01-31 DIAGNOSIS — M545 Low back pain, unspecified: Secondary | ICD-10-CM | POA: Diagnosis not present

## 2023-01-31 DIAGNOSIS — M79605 Pain in left leg: Secondary | ICD-10-CM | POA: Diagnosis not present

## 2023-01-31 NOTE — Therapy (Signed)
OUTPATIENT PHYSICAL THERAPY THORACOLUMBAR Treatment   Patient Name: [name] MRN: [id number] DOB:Aug 18, 1970, 52 y.o., female Today's Date: 01/31/2023  END OF SESSION:  PT End of Session - 01/31/23 0842     Visit Number 2    Number of Visits 8    Date for PT Re-Evaluation 02/28/23    Authorization Type [place]    Progress Note Due on Visit 8    PT Start Time 0800    PT Stop Time 0843    PT Time Calculation (min) 43 min    Activity Tolerance Patient tolerated treatment well    Behavior During Therapy [facility] for tasks assessed/performed              Past Medical History:  Diagnosis Date   Breast cancer (HCC) 2012,2018   rt breast   Breast disorder    cancer right breast   Cancer (HCC)    right lobular carcinoma in situ   Gastroparesis    controlled   Past Surgical History:  Procedure Laterality Date   BACK SURGERY  10/2002   lumbar hemilaminectomy, microdiscectomy L5-S1   BALLOON DILATION N/A 12/14/2021   Procedure: BALLOON DILATION;  Surgeon: Lanelle Bal, DO;  [facility];  Service: Endoscopy;  Laterality: N/A;   BREAST BIOPSY Right 07/2016   malignant; waiting on MRI results   BREAST LUMPECTOMY Right 2012,2018   BREAST LUMPECTOMY WITH RADIOACTIVE SEED LOCALIZATION Right 10/08/2016   Procedure: RIGHT BREAST LUMPECTOMY WITH RADIOACTIVE SEED LOCALIZATION;  Surgeon: Claud Kelp, MD;  Location:  SURGERY CENTER;  Service: General;  Laterality: Right;   BREAST SURGERY     COLONOSCOPY WITH PROPOFOL N/A 12/14/2021   Dr. Marletta Lor: non-bleeding internal hemorrhoids, three 4-7 mm polyps (tubular and hyperplastic). Surveillance in 5 years   ESOPHAGOGASTRODUODENOSCOPY  08/2007   normal esophagus, large gastric diverticulum, patent pylorus   ESOPHAGOGASTRODUODENOSCOPY (EGD) WITH PROPOFOL N/A 12/14/2021   3 cm hiatal hernia, gastric diverticulum, four gastric fundic gland polyps, empiric dilation   POLYPECTOMY  12/14/2021   Procedure:  POLYPECTOMY;  Surgeon: Lanelle Bal, DO;  [facility];  Service: Endoscopy;;   TUBAL LIGATION  2001   Patient Active Problem List   Diagnosis Date Noted   Acute bacterial bronchitis 03/13/2022   Erythrocytosis 03/13/2022   Dysphagia 11/13/2021   Hepatic steatosis 11/13/2021   Encounter for general adult medical examination with abnormal findings 09/06/2021   Seborrheic keratosis 05/30/2021   HLD (hyperlipidemia) 03/01/2021   Gastroesophageal reflux disease without esophagitis 03/01/2021   Morbid obesity (HCC) 03/01/2021   Prediabetes 10/26/2020   Peri-menopause 09/08/2020   Hypertension 06/12/2020   Anxiety 04/09/2019   History of right breast cancer 09/02/2016   Recurrent breast cancer, right (HCC) 09/02/2016   SUI (stress urinary incontinence, female) 09/02/2016   Lobular carcinoma in situ of right breast 12/03/2010    PCP: Trena Platt  REFERRING PROVIDER: Darreld Mclean   REFERRING DIAG:  Diagnosis  M54.50,M79.605 (ICD-10-CM) - Lumbar pain with radiation down left leg    Rationale for Evaluation and Treatment: Rehabilitation  THERAPY DIAG:  Diagnosis  M54.50,M79.605 (ICD-10-CM) - Lumbar pain with radiation down left leg    ONSET DATE: 12/8  SUBJECTIVE:  SUBJECTIVE STATEMENT:  Pt states that she really did not do any of her exercises.  PERTINENT HISTORY:  Micro-disectomy in lumbar area  PAIN:  Are you having pain? Yes: 5 Pain location: back down past  left knee  Pain description: aching, numb  Aggravating factors: walking Relieving factors: TENS  PRECAUTIONS: None WEIGHT BEARING RESTRICTIONS: No  FALLS:  Has patient fallen in last 6 months? No  LIVING ENVIRONMENT: Lives with: lives with their family Lives in: House/apartment Stairs: Yes: Internal: 10  steps; on right going up can not lead with her left leg at this time. Has following equipment at home: None  OCCUPATION: PT experience   PLOF: Independent  PATIENT GOALS: less pain   NEXT MD VISIT: after therapy   OBJECTIVE:  Note: Objective measures were completed at Evaluation unless otherwise noted.  DIAGNOSTIC FINDINGS:  Impression: negative lumbar spine, no acute findings.  COGNITION: Overall cognitive status: Within functional limits for tasks assessed    Lt  hamstring 150 ; Rt 165  LUMBAR ROM:   AROM eval  Flexion Able to touch toes reps no change  Extension 28; reps improves   Right lateral flexion   Left lateral flexion   Right rotation   Left rotation    (Blank rows = not tested)   LOWER EXTREMITY MMT:    MMT Right eval Left eval  Hip flexion 5 3+  Hip extension 3 3  Hip abduction 4+ 4  Hip adduction    Hip internal rotation    Hip external rotation    Knee flexion 5 3+  Knee extension 5 4  Ankle dorsiflexion 5 5  Ankle plantarflexion    Ankle inversion    Ankle eversion     (Blank rows = not tested)  FUNCTIONAL TESTS:  30 seconds chair stand test:12 is poor for age and sex, 15 below average 18 average  2 minute walk test: 500 ft  Single leg stance: RT: 60"   LT:  60"   TREATMENT DATE: 01/31/23 Standing:  Lumbar extension x 10 Supine: Knee to chest 20" x 3 Hamstring stretch x 30" Bridge x 10  Bent knee lift x 10  Sit to stand x 10   01/29/23: Evaluation        Standing Lumbar Extension  - 10 reps  Seated Long Arc Quad  10 reps - 5second hold  Seated Transversus Abdominis Bracing   10 reps - 5 hold  Prone Press Up   5- 2" hold supine active LT hamstring stretch x 30" x 2                                                                                                                          PATIENT EDUCATION:  Education details: HEP Person educated: Patient Education method: Explanation and Handouts Education comprehension:  verbalized understanding  HOME EXERCISE PROGRAM: Access Code: [email or web address] URL: [email or web address] Date: 01/29/2023 Prepared by: Virgina Organ  Exercises - Standing Lumbar Extension  -  5 x daily - 7 x weekly - 1 sets - 5-10 reps - Seated Long Arc Quad  - 3 x daily - 7 x weekly - 1 sets - 10 reps - 5second s hold - Seated Transversus Abdominis Bracing  - 5 x daily - 7 x weekly - 1 sets - 10 reps - 5 hold - Prone Press Up  - 2 x daily - 7 x weekly - 1 sets - 5-10 reps - 2" hold -supine active hamstring stretch x 30"  x 3 2 x daily   01/31/23- Supine Bridge  - 2 x daily - 7 x weekly - 1 sets - 10 reps - 5" hold - Sit to Stand  - 2 x daily - 7 x weekly - 1 sets - 10 reps  ASSESSMENT:  CLINICAL IMPRESSION: Reviewed evaluation, and goals with pt.  Reviewed HEP which needed multiple verbal cuing to complete correctly.  Reviewed HEP.  PT continues  demonstrates decreased activity tolerance, decreased ROM, decreased strength and increased pain.  Ms. Turknett will benefit from skilled PT to address these deficits and maximize her functional ability. .   OBJECTIVE IMPAIRMENTS: decreased activity tolerance, decreased balance, decreased ROM, decreased strength, and pain.   ACTIVITY LIMITATIONS: bending, sitting, standing, and locomotion level  PARTICIPATION LIMITATIONS: cleaning, driving, shopping, community activity, and occupation  REHAB POTENTIAL: Good  CLINICAL DECISION MAKING: Evolving/moderate complexity  EVALUATION COMPLEXITY: Moderate   GOALS: Goals reviewed with patient? No  SHORT TERM GOALS: Target date: 02/12/22  PT to be I in HEP in order to decrease her pain to no greater than a 5 Baseline: Goal status: on-going  2.  Pt radicular sx to be no further than her mid thigh to demonstrate decreased nerve irritation  Baseline:  Goal status: on-going  3.  PT strength to increase 1/2 grade to allow pt to go up and down 10 steps in a reciprocal manner. Baseline:   Goal status:  on-going   LONG TERM GOALS: Target date: 02/27/23  PT to be I in an advanced HEP in order to decrease her pain to no greater than a 2 Baseline:  Goal status:  on-going  2.  PT to have no radicular sx  Baseline:  Goal status:  on-going  3.  PT mm strength to be increased one grade to be able to squat and return from a squatted position for housework.  Baseline:  Goal status:  on-going  4.  PT to be able to complete 18 sit to stand in 30 seconds to be in the average range for sex and age.  Baseline:  Goal status:  on-going  PLAN:  PT FREQUENCY: 2x/week  PT DURATION: 4 weeks  PLANNED INTERVENTIONS: 97110-Therapeutic exercises, 97530- Therapeutic activity, 97535- Self Care, 16109- Manual therapy, and Patient/Family education.  PLAN FOR NEXT SESSION: Continue with stabilization for strengthening of core and LE, body mechanic instruction and extension based exercises.   Virgina Organ, PT CLT [phone]  01/31/2023, 8:42 AM

## 2023-02-06 ENCOUNTER — Other Ambulatory Visit: Payer: Self-pay | Admitting: *Deleted

## 2023-02-06 DIAGNOSIS — C50911 Malignant neoplasm of unspecified site of right female breast: Secondary | ICD-10-CM

## 2023-02-07 ENCOUNTER — Ambulatory Visit (HOSPITAL_COMMUNITY): Payer: Commercial Managed Care - PPO

## 2023-02-07 ENCOUNTER — Ambulatory Visit (HOSPITAL_COMMUNITY)
Admission: RE | Admit: 2023-02-07 | Discharge: 2023-02-07 | Disposition: A | Discharge status: Home / Self Care | Payer: Commercial Managed Care - PPO | Source: Ambulatory Visit | Attending: Hematology | Admitting: Hematology

## 2023-02-07 ENCOUNTER — Ambulatory Visit (HOSPITAL_COMMUNITY): Admission: RE | Admit: 2023-02-07 | Payer: Commercial Managed Care - PPO | Source: Ambulatory Visit

## 2023-02-07 ENCOUNTER — Inpatient Hospital Stay: Payer: Commercial Managed Care - PPO | Attending: Hematology

## 2023-02-07 DIAGNOSIS — Z87891 Personal history of nicotine dependence: Secondary | ICD-10-CM | POA: Diagnosis not present

## 2023-02-07 DIAGNOSIS — R9230 Dense breasts, unspecified: Secondary | ICD-10-CM | POA: Diagnosis not present

## 2023-02-07 DIAGNOSIS — N6314 Unspecified lump in the right breast, lower inner quadrant: Secondary | ICD-10-CM | POA: Diagnosis not present

## 2023-02-07 DIAGNOSIS — D0501 Lobular carcinoma in situ of right breast: Secondary | ICD-10-CM | POA: Diagnosis not present

## 2023-02-07 DIAGNOSIS — D751 Secondary polycythemia: Secondary | ICD-10-CM | POA: Diagnosis not present

## 2023-02-07 DIAGNOSIS — D582 Other hemoglobinopathies: Secondary | ICD-10-CM

## 2023-02-07 DIAGNOSIS — C50911 Malignant neoplasm of unspecified site of right female breast: Secondary | ICD-10-CM

## 2023-02-07 DIAGNOSIS — Z1231 Encounter for screening mammogram for malignant neoplasm of breast: Secondary | ICD-10-CM | POA: Diagnosis not present

## 2023-02-07 LAB — CBC WITH DIFFERENTIAL/PLATELET
Abs Immature Granulocytes: 0.01 10*3/uL (ref 0.00–0.07)
Basophils Absolute: 0 10*3/uL (ref 0.0–0.1)
Basophils Relative: 1 %
Eosinophils Absolute: 0.2 10*3/uL (ref 0.0–0.5)
Eosinophils Relative: 3 %
HCT: 47 % — ABNORMAL HIGH (ref 36.0–46.0)
Hemoglobin: 15.4 g/dL — ABNORMAL HIGH (ref 12.0–15.0)
Immature Granulocytes: 0 %
Lymphocytes Relative: 37 %
Lymphs Abs: 2.2 10*3/uL (ref 0.7–4.0)
MCH: 30.4 pg (ref 26.0–34.0)
MCHC: 32.8 g/dL (ref 30.0–36.0)
MCV: 92.9 fL (ref 80.0–100.0)
Monocytes Absolute: 0.4 10*3/uL (ref 0.1–1.0)
Monocytes Relative: 7 %
Neutro Abs: 3.1 10*3/uL (ref 1.7–7.7)
Neutrophils Relative %: 52 %
Platelets: 215 10*3/uL (ref 150–400)
RBC: 5.06 MIL/uL (ref 3.87–5.11)
RDW: 12.5 % (ref 11.5–15.5)
WBC: 5.9 10*3/uL (ref 4.0–10.5)
nRBC: 0 % (ref 0.0–0.2)

## 2023-02-07 LAB — COMPREHENSIVE METABOLIC PANEL
ALT: 29 U/L (ref 0–44)
AST: 23 U/L (ref 15–41)
Albumin: 4.2 g/dL (ref 3.5–5.0)
Alkaline Phosphatase: 63 U/L (ref 38–126)
Anion gap: 6 (ref 5–15)
BUN: 14 mg/dL (ref 6–20)
CO2: 24 mmol/L (ref 22–32)
Calcium: 9.4 mg/dL (ref 8.9–10.3)
Chloride: 107 mmol/L (ref 98–111)
Creatinine, Ser: 0.78 mg/dL (ref 0.44–1.00)
GFR, Estimated: >60 mL/min (ref >60)
Glucose, Bld: 99 mg/dL (ref 70–99)
Potassium: 4 mmol/L (ref 3.5–5.1)
Sodium: 137 mmol/L (ref 135–145)
Total Bilirubin: 0.7 mg/dL (ref <1.2)
Total Protein: 7.6 g/dL (ref 6.5–8.1)

## 2023-02-07 LAB — VITAMIN D 25 HYDROXY (VIT D DEFICIENCY, FRACTURES): Vit D, 25-Hydroxy: 34.33 ng/mL (ref 30–100)

## 2023-02-07 MED ORDER — GADOBUTROL 1 MMOL/ML IV SOLN
10.0000 mL | Freq: Once | INTRAVENOUS | Status: AC | PRN
  Administered 2023-02-07: 10 mL via INTRAVENOUS

## 2023-02-08 LAB — ERYTHROPOIETIN: Erythropoietin: 12 m[IU]/mL (ref 2.6–18.5)

## 2023-02-10 ENCOUNTER — Other Ambulatory Visit (HOSPITAL_COMMUNITY): Payer: Self-pay | Admitting: Hematology

## 2023-02-10 DIAGNOSIS — R928 Other abnormal and inconclusive findings on diagnostic imaging of breast: Secondary | ICD-10-CM

## 2023-02-11 ENCOUNTER — Inpatient Hospital Stay: Payer: Commercial Managed Care - PPO | Admitting: Hematology

## 2023-02-11 VITALS — BP 163/104 | HR 90 | Temp 98.9°F | Resp 16 | Wt 237.0 lb

## 2023-02-11 DIAGNOSIS — D0501 Lobular carcinoma in situ of right breast: Secondary | ICD-10-CM | POA: Diagnosis not present

## 2023-02-11 DIAGNOSIS — Z87891 Personal history of nicotine dependence: Secondary | ICD-10-CM | POA: Diagnosis not present

## 2023-02-11 DIAGNOSIS — D751 Secondary polycythemia: Secondary | ICD-10-CM | POA: Diagnosis not present

## 2023-02-11 NOTE — Progress Notes (Signed)
 [facility] [address] [place] [address]    Clinic Day:  02/11/2023  Referring physician: Tobie Suzzane POUR, MD  Patient Care Team: Tobie Suzzane POUR, MD as PCP - General (Internal Medicine)   ASSESSMENT & PLAN:   Assessment: 1.  Recurrent right breast LCIS: -Initially diagnosed with right breast LCIS in 2012, status post lumpectomy, ER/PR positive, did not take tamoxifen . -Recurrent right breast LCIS, status post lumpectomy on 10/08/2016. -Tamoxifen  started in September 2018, self discontinued around June 2022 due to intolerance from hot flashes.  Plan: 1.  Recurrent right breast LCIS: - She had a mammogram done on 02/06/2022 which was BI-RADS Category 0. - MRI breast from 02/07/2023: 6-7 mm enhancing mass in the lower inner quadrant of the right breast.  No abnormal appearing lymph nodes.  No evidence of left breast primary. - She has a cyst in the left axilla, likely from folliculitis. - She has appointment for diagnostic mammogram and ultrasound scheduled next week.  This will be followed by biopsy. - I will see her back after the biopsy or surgery if needed.   2.  Erythrocytosis: - No aqua genic pruritus or vasomotor symptoms.  No history of sleep apnea.  Quit smoking at age 87, smoked for 12 years.  She is no longer on diuretic. - She has mildly elevated hemoglobin and hematocrit since September 2023. - Latest CBC with hemoglobin 15.4 and hematocrit 47.  Serum EPO level is 12.  RBC count normal. - If there is any worsening we will consider JAK2 V617F testing.  No orders of the defined types were placed in this encounter.     LILLETTE Verneta SAUNDERS Teague,acting as a neurosurgeon for Alean Stands, MD.,have documented all relevant documentation on the behalf of Alean Stands, MD,as directed by  Alean Stands, MD while in the presence of Alean Stands, MD.   I, Alean Stands MD, have reviewed the above documentation for accuracy and  completeness, and I agree with the above.   Alean Stands, MD   12/31/20244:40 PM  CHIEF COMPLAINT:   Diagnosis: right breast LCIS   Cancer Staging  No matching staging information was found for the patient.    Prior Therapy:  Right lumpectomy on 10/08/2016 Tamoxifen  from September 2018 to June 2022  Current Therapy: None   HISTORY OF PRESENT ILLNESS:   Oncology History   No history exists.     INTERVAL HISTORY:   [name] is a 52 y.o. female presenting to clinic today for follow up of right breast LCIS. She was last seen by me on 02/07/22.  Since her last visit, she underwent bilateral breast MRI on 02/07/23 that found: a 6-7 mm enhancing mass in the lower inner anterior right breast; no evidence of left breast malignancy; and no abnormalities in the left axilla take corresponds with the patient's prior complaint of left axillary swelling and pain. She has a right diagnostic MM and US  scheduled for 02/17/22.   Today, she states that she is doing well overall. Her appetite level is at 100%. Her energy level is at 100%. She is accompanied by her sister.   PAST MEDICAL HISTORY:   Past Medical History: Past Medical History:  Diagnosis Date   Breast cancer (HCC) [id number]   rt breast   Breast disorder    cancer right breast   Cancer (HCC)    right lobular carcinoma in situ   Gastroparesis    controlled    Surgical History: Past Surgical History:  Procedure Laterality Date   BACK SURGERY  10/2002   lumbar hemilaminectomy, microdiscectomy L5-S1   BALLOON DILATION N/A 12/14/2021   Procedure: BALLOON DILATION;  Surgeon: Cindie Carlin POUR, DO;  Location: [facility];  Service: Endoscopy;  Laterality: N/A;   BREAST BIOPSY Right 07/2016   malignant; waiting on MRI results   BREAST LUMPECTOMY Right 2012,2018   BREAST LUMPECTOMY WITH RADIOACTIVE SEED LOCALIZATION Right 10/08/2016   Procedure: RIGHT BREAST LUMPECTOMY WITH RADIOACTIVE SEED LOCALIZATION;  Surgeon: Gail Favorite, MD;  Location: [facility];  Service: General;  Laterality: Right;   BREAST SURGERY     COLONOSCOPY WITH PROPOFOL  N/A 12/14/2021   Dr. Cindie: non-bleeding internal hemorrhoids, three 4-7 mm polyps (tubular and hyperplastic). Surveillance in 5 years   ESOPHAGOGASTRODUODENOSCOPY  08/2007   normal esophagus, large gastric diverticulum, patent pylorus   ESOPHAGOGASTRODUODENOSCOPY (EGD) WITH PROPOFOL  N/A 12/14/2021   3 cm hiatal hernia, gastric diverticulum, four gastric fundic gland polyps, empiric dilation   POLYPECTOMY  12/14/2021   Procedure: POLYPECTOMY;  Surgeon: Cindie Carlin POUR, DO;  Location: [facility];  Service: Endoscopy;;   TUBAL LIGATION  2001    Social History: Social History   Socioeconomic History   Marital status: Married    Spouse name: Not on file   Number of children: Not on file   Years of education: Not on file   Highest education level: Not on file  Occupational History   Not on file  Tobacco Use   Smoking status: Former    Current packs/day: 0.00    Average packs/day: 1 pack/day for 16.0 years (16.0 ttl pk-yrs)    Types: Cigarettes    Start date: 08/12/1984    Quit date: 08/12/2000    Years since quitting: 22.5   Smokeless tobacco: Never   Tobacco comments:    quit 10 yrs  Vaping Use   Vaping status: Never Used  Substance and Sexual Activity   Alcohol use: Yes    Comment: occasionally   Drug use: No   Sexual activity: Yes    Birth control/protection: Surgical    Comment: tubal  Other Topics Concern   Not on file  Social History Narrative   Not on file   Social Drivers of Health   Financial Resource Strain: Low Risk  (09/08/2020)   Overall Financial Resource Strain (CARDIA)    Difficulty of Paying Living Expenses: Not hard at all  Food Insecurity: No Food Insecurity (09/08/2020)   Hunger Vital Sign    Worried About Running Out of Food in the Last Year: Never true    Ran Out of Food in the Last Year: Never true   Transportation Needs: No Transportation Needs (09/08/2020)   PRAPARE - Administrator, Civil Service (Medical): No    Lack of Transportation (Non-Medical): No  Physical Activity: Insufficiently Active (09/08/2020)   Exercise Vital Sign    Days of Exercise per Week: 1 day    Minutes of Exercise per Session: 30 min  Stress: No Stress Concern Present (09/08/2020)   [employer] of Occupational Health - Occupational Stress Questionnaire    Feeling of Stress : Only a little  Social Connections: Moderately Integrated (09/08/2020)   Social Connection and Isolation Panel [NHANES]    Frequency of Communication with Friends and Family: More than three times a week    Frequency of Social Gatherings with Friends and Family: Three times a week    Attends Religious Services: Never    Active  Member of Clubs or Organizations: Yes    Attends Engineer, Structural: More than 4 times per year    Marital Status: Married  Catering Manager Violence: Not At Risk (09/08/2020)   Humiliation, Afraid, Rape, and Kick questionnaire    Fear of Current or Ex-Partner: No    Emotionally Abused: No    Physically Abused: No    Sexually Abused: No    Family History: Family History  Problem Relation Age of Onset   Hypertension Mother    Hyperlipidemia Mother    Diverticulitis Mother    Hernia Mother    Heart disease Father        arrythmia   Hypertension Father    Arthritis Brother    Heart attack Maternal Grandmother    Aneurysm Maternal Grandfather    Congestive Heart Failure Paternal Grandmother    Cancer Maternal Uncle        pancreatic   Colon cancer Neg Hx    Colon polyps Neg Hx     Current Medications:  Current Outpatient Medications:    cyclobenzaprine  (FLEXERIL ) 10 MG tablet, Take 1 tablet (10 mg total) by mouth at bedtime. One tablet every night at bedtime as needed for spasm., Disp: 30 tablet, Rfl: 0   ibuprofen (ADVIL) 200 MG tablet, Take 800 mg by mouth every 8 (eight)  hours as needed for moderate pain., Disp: , Rfl:    promethazine -dextromethorphan (PROMETHAZINE -DM) 6.25-15 MG/5ML syrup, Take 5 mLs by mouth 4 (four) times daily as needed for cough., Disp: 118 mL, Rfl: 0   Allergies: Allergies  Allergen Reactions   Augmentin [Amoxicillin-Pot Clavulanate] Hives    REVIEW OF SYSTEMS:   Review of Systems  Constitutional:  Negative for chills, fatigue and fever.  HENT:   Negative for lump/mass, mouth sores, nosebleeds, sore throat and trouble swallowing.   Eyes:  Negative for eye problems.  Respiratory:  Negative for cough and shortness of breath.   Cardiovascular:  Negative for chest pain, leg swelling and palpitations.  Gastrointestinal:  Negative for abdominal pain, constipation, diarrhea, nausea and vomiting.  Genitourinary:  Negative for bladder incontinence, difficulty urinating, dysuria, frequency, hematuria and nocturia.   Musculoskeletal:  Positive for arthralgias (in left knee, 5/10 pain severity) and back pain (5/10 severity). Negative for flank pain, myalgias and neck pain.  Skin:  Negative for itching and rash.  Neurological:  Negative for dizziness, headaches and numbness.  Hematological:  Does not bruise/bleed easily.  Psychiatric/Behavioral:  Positive for sleep disturbance. Negative for depression and suicidal ideas. The patient is not nervous/anxious.   All other systems reviewed and are negative.    VITALS:   Blood pressure (!) 163/104, pulse 90, temperature 98.9 F (37.2 C), temperature source Tympanic, resp. rate 16, weight 237 lb (107.5 kg), SpO2 97%.  Wt Readings from Last 3 Encounters:  02/11/23 237 lb (107.5 kg)  01/23/23 237 lb (107.5 kg)  03/13/22 222 lb (100.7 kg)    Body mass index is 37.12 kg/m.  Performance status (ECOG): 0 - Asymptomatic  PHYSICAL EXAM:   Physical Exam Vitals and nursing note reviewed. Exam conducted with a chaperone present.  Constitutional:      Appearance: Normal appearance.   Cardiovascular:     Rate and Rhythm: Normal rate and regular rhythm.     Pulses: Normal pulses.     Heart sounds: Normal heart sounds.  Pulmonary:     Effort: Pulmonary effort is normal.     Breath sounds: Normal breath sounds.  Abdominal:  Palpations: Abdomen is soft. There is no hepatomegaly, splenomegaly or mass.     Tenderness: There is no abdominal tenderness.  Musculoskeletal:     Right lower leg: No edema.     Left lower leg: No edema.  Lymphadenopathy:     Cervical: No cervical adenopathy.     Right cervical: No superficial, deep or posterior cervical adenopathy.    Left cervical: No superficial, deep or posterior cervical adenopathy.     Upper Body:     Right upper body: No supraclavicular or axillary adenopathy.     Left upper body: No supraclavicular or axillary adenopathy.  Neurological:     General: No focal deficit present.     Mental Status: She is alert and oriented to person, place, and time.  Psychiatric:        Mood and Affect: Mood normal.        Behavior: Behavior normal.     LABS:   CBC     Component Value Date/Time   WBC 5.9 02/07/2023 1249   RBC 5.06 02/07/2023 1249   HGB 15.4 (H) 02/07/2023 1249   HGB 15.5 09/06/2021 1021   HGB 13.6 04/01/2012 1327   HCT 47.0 (H) 02/07/2023 1249   HCT 45.9 09/06/2021 1021   HCT 39.8 04/01/2012 1327   PLT 215 02/07/2023 1249   PLT 218 09/06/2021 1021   MCV 92.9 02/07/2023 1249   MCV 91 09/06/2021 1021   MCV 89.8 04/01/2012 1327   MCH 30.4 02/07/2023 1249   MCHC 32.8 02/07/2023 1249   RDW 12.5 02/07/2023 1249   RDW 12.5 09/06/2021 1021   RDW 12.6 04/01/2012 1327   LYMPHSABS 2.2 02/07/2023 1249   LYMPHSABS 1.7 09/06/2021 1021   LYMPHSABS 1.8 04/01/2012 1327   MONOABS 0.4 02/07/2023 1249   MONOABS 0.5 04/01/2012 1327   EOSABS 0.2 02/07/2023 1249   EOSABS 0.0 09/06/2021 1021   BASOSABS 0.0 02/07/2023 1249   BASOSABS 0.0 09/06/2021 1021   BASOSABS 0.0 04/01/2012 1327    CMP      Component  Value Date/Time   NA 137 02/07/2023 1249   NA 137 09/06/2021 1021   NA 140 04/01/2012 1327   K 4.0 02/07/2023 1249   K 3.5 04/01/2012 1327   CL 107 02/07/2023 1249   CL 106 04/01/2012 1327   CO2 24 02/07/2023 1249   CO2 23 04/01/2012 1327   GLUCOSE 99 02/07/2023 1249   GLUCOSE 98 04/01/2012 1327   BUN 14 02/07/2023 1249   BUN 11 09/06/2021 1021   BUN 13.8 04/01/2012 1327   CREATININE 0.78 02/07/2023 1249   CREATININE 1.0 04/01/2012 1327   CALCIUM 9.4 02/07/2023 1249   CALCIUM 9.2 04/01/2012 1327   PROT 7.6 02/07/2023 1249   PROT 7.1 09/06/2021 1021   PROT 7.1 04/01/2012 1327   ALBUMIN 4.2 02/07/2023 1249   ALBUMIN 4.1 09/06/2021 1021   ALBUMIN 3.6 04/01/2012 1327   AST 23 02/07/2023 1249   AST 18 04/01/2012 1327   ALT 29 02/07/2023 1249   ALT 20 04/01/2012 1327   ALKPHOS 63 02/07/2023 1249   ALKPHOS 71 04/01/2012 1327   BILITOT 0.7 02/07/2023 1249   BILITOT 0.5 09/06/2021 1021   BILITOT 0.50 04/01/2012 1327   GFRNONAA >60 02/07/2023 1249   GFRAA >60 05/17/2019 1132     No results found for: CEA1, CEA / No results found for: CEA1, CEA No results found for: PSA1 No results found for: CAN199 No results found for: CAN125  No results  found for: TOTALPROTELP, ALBUMINELP, A1GS, A2GS, BETS, BETA2SER, GAMS, MSPIKE, SPEI No results found for: TIBC, FERRITIN, IRONPCTSAT No results found for: LDH   STUDIES:   MM 3D SCREEN BREAST BILATERAL Result Date: 02/07/2023 CLINICAL DATA:  Screening. EXAM: DIGITAL SCREENING BILATERAL MAMMOGRAM WITH TOMOSYNTHESIS AND CAD TECHNIQUE: Bilateral screening digital craniocaudal and mediolateral oblique mammograms were obtained. Bilateral screening digital breast tomosynthesis was performed. The images were evaluated with computer-aided detection. COMPARISON:  Previous exam(s). ACR Breast Density Category b: There are scattered areas of fibroglandular density. FINDINGS: In the right breast, a possible  asymmetry warrants further evaluation. In the left breast, no findings suspicious for malignancy. IMPRESSION: Further evaluation is suggested for possible asymmetry in the right breast. RECOMMENDATION: Diagnostic mammogram and possibly ultrasound of the right breast. (Code:[id number]) The patient will be contacted regarding the findings, and additional imaging will be scheduled. BI-RADS CATEGORY  0: Incomplete: Need additional imaging evaluation. Electronically Signed   By: Rosaline Collet M.D.   On: 02/07/2023 14:41   MR BREAST BILATERAL W WO CONTRAST INC CAD Result Date: 02/07/2023 CLINICAL DATA:  52 year old female with history of prior excisional biopsies for LCIS in the right breast in 2012 and 2018. The patient indicates that she had a painful swollen area in the left axilla x2 weeks, but is currently asymptomatic. EXAM: BILATERAL BREAST MRI WITH AND WITHOUT CONTRAST TECHNIQUE: Multiplanar, multisequence MR images of both breasts were obtained prior to and following the intravenous administration of 10 ml of Gadavist  Three-dimensional MR images were rendered by post-processing of the original MR data on an independent workstation. The three-dimensional MR images were interpreted, and findings are reported in the following complete MRI report for this study. Three dimensional images were evaluated at the independent interpreting workstation using the DynaCAD thin client. COMPARISON:  Previous exam(s). FINDINGS: Breast composition: c. Heterogeneous fibroglandular tissue. Background parenchymal enhancement: Mild Right breast: There is a 6-7 mm enhancing mass in the lower inner anterior right breast (series 6, image 15). Surgical changes noted in the right breast consistent with prior history of excisional biopsies. No other mass or abnormal enhancement. Left breast: No mass or abnormal enhancement. Lymph nodes: No abnormal appearing lymph nodes. No abnormalities found in the left axilla to correspond with the  patient's prior painful swollen area of concern. Ancillary findings:  None. IMPRESSION: 1. There is a 6-7 mm enhancing mass in the lower inner anterior right breast. 2.  No evidence of left breast malignancy. 3. No abnormalities in the left axilla take corresponds with the patient's prior complaint of left axillary swelling and pain. RECOMMENDATION: MRI guided biopsy is recommended for the right breast mass. BI-RADS CATEGORY  4: Suspicious. Electronically Signed   By: Rosaline Collet M.D.   On: 02/07/2023 08:15   DG Lumbar Spine Complete Result Date: 01/23/2023 Clinical:  lower back pain after lifting X-rays were done of the lumbar spine, five views. Normal lumbar lordosis is present.  Disc spaces are well maintained.  Alignment of lumbar spine is normal.  No fracture is noted.  Bone quality is good. Impression:  negative lumbar spine, no acute findings. Electronically Signed Lemond Stable, MD 12/12/202410:14 AM

## 2023-02-14 ENCOUNTER — Encounter (HOSPITAL_COMMUNITY): Payer: Self-pay

## 2023-02-14 ENCOUNTER — Ambulatory Visit (HOSPITAL_COMMUNITY): Payer: Commercial Managed Care - PPO | Attending: Orthopaedic Surgery

## 2023-02-14 DIAGNOSIS — M5416 Radiculopathy, lumbar region: Secondary | ICD-10-CM | POA: Diagnosis not present

## 2023-02-14 NOTE — Therapy (Signed)
 OUTPATIENT PHYSICAL THERAPY THORACOLUMBAR Treatment   Patient Name: [name] MRN: [id number] DOB:[birth year], 53 y.o., female Today's Date: 02/14/2023  END OF SESSION:  PT End of Session - 02/14/23 1325     Visit Number 3    Number of Visits 8    Date for PT Re-Evaluation 02/28/23    Authorization Type [place]    Progress Note Due on Visit 8    PT Start Time 1156    PT Stop Time 1235    PT Time Calculation (min) 39 min    Activity Tolerance Patient tolerated treatment well    Behavior During Therapy [facility] for tasks assessed/performed               Past Medical History:  Diagnosis Date   Breast cancer (HCC) 2012,2018   rt breast   Breast disorder    cancer right breast   Cancer (HCC)    right lobular carcinoma in situ   Gastroparesis    controlled   Past Surgical History:  Procedure Laterality Date   BACK SURGERY  10/2002   lumbar hemilaminectomy, microdiscectomy L5-S1   BALLOON DILATION N/A 12/14/2021   Procedure: BALLOON DILATION;  Surgeon: Cindie Carlin POUR, DO;  [facility];  Service: Endoscopy;  Laterality: N/A;   BREAST BIOPSY Right 07/2016   malignant; waiting on MRI results   BREAST LUMPECTOMY Right 2012,2018   BREAST LUMPECTOMY WITH RADIOACTIVE SEED LOCALIZATION Right 10/08/2016   Procedure: RIGHT BREAST LUMPECTOMY WITH RADIOACTIVE SEED LOCALIZATION;  Surgeon: Gail Favorite, MD;  Location: [facility];  Service: General;  Laterality: Right;   BREAST SURGERY     COLONOSCOPY WITH PROPOFOL  N/A 12/14/2021   Dr. Cindie: non-bleeding internal hemorrhoids, three 4-7 mm polyps (tubular and hyperplastic). Surveillance in 5 years   ESOPHAGOGASTRODUODENOSCOPY  08/2007   normal esophagus, large gastric diverticulum, patent pylorus   ESOPHAGOGASTRODUODENOSCOPY (EGD) WITH PROPOFOL  N/A 12/14/2021   3 cm hiatal hernia, gastric diverticulum, four gastric fundic gland polyps, empiric dilation   POLYPECTOMY  12/14/2021   Procedure:  POLYPECTOMY;  Surgeon: Cindie Carlin POUR, DO;  [facility];  Service: Endoscopy;;   TUBAL LIGATION  2001   Patient Active Problem List   Diagnosis Date Noted   Acute bacterial bronchitis 03/13/2022   Erythrocytosis 03/13/2022   Dysphagia 11/13/2021   Hepatic steatosis 11/13/2021   Encounter for general adult medical examination with abnormal findings 09/06/2021   Seborrheic keratosis 05/30/2021   HLD (hyperlipidemia) 03/01/2021   Gastroesophageal reflux disease without esophagitis 03/01/2021   Morbid obesity (HCC) 03/01/2021   Prediabetes 10/26/2020   Peri-menopause 09/08/2020   Hypertension 06/12/2020   Anxiety 04/09/2019   History of right breast cancer 09/02/2016   Recurrent breast cancer, right (HCC) 09/02/2016   SUI (stress urinary incontinence, female) 09/02/2016   Lobular carcinoma in situ of right breast 12/03/2010    PCP: Suzzane Blanch  REFERRING PROVIDER: Lemond Stable   REFERRING DIAG:  Diagnosis  M54.50,M79.605 (ICD-10-CM) - Lumbar pain with radiation down left leg    Rationale for Evaluation and Treatment: Rehabilitation  THERAPY DIAG:  Diagnosis  M54.50,M79.605 (ICD-10-CM) - Lumbar pain with radiation down left leg    ONSET DATE: 12/8  SUBJECTIVE:  SUBJECTIVE STATEMENT:   Pt stated she can tell the improvements with exercises.  Current pain scale 3/10 Lt LBP with radicular symptoms with decreased numbness and tingling.    PERTINENT HISTORY:  Micro-disectomy in lumbar area  PAIN:  Are you having pain? Yes: 5 Pain location: back down past  left knee  Pain description: aching, numb  Aggravating factors: walking Relieving factors: TENS  PRECAUTIONS: None WEIGHT BEARING RESTRICTIONS: No  FALLS:  Has patient fallen in last 6 months? No  LIVING  ENVIRONMENT: Lives with: lives with their family Lives in: House/apartment Stairs: Yes: Internal: 10 steps; on right going up can not lead with her left leg at this time. Has following equipment at home: None  OCCUPATION: PT experience   PLOF: Independent  PATIENT GOALS: less pain   NEXT MD VISIT: after therapy   OBJECTIVE:  Note: Objective measures were completed at Evaluation unless otherwise noted.  DIAGNOSTIC FINDINGS:  Impression: negative lumbar spine, no acute findings.  COGNITION: Overall cognitive status: Within functional limits for tasks assessed    Lt  hamstring 150 ; Rt 165  LUMBAR ROM:   AROM eval  Flexion Able to touch toes reps no change  Extension 28; reps improves   Right lateral flexion   Left lateral flexion   Right rotation   Left rotation    (Blank rows = not tested)   LOWER EXTREMITY MMT:    MMT Right eval Left eval  Hip flexion 5 3+  Hip extension 3 3  Hip abduction 4+ 4  Hip adduction    Hip internal rotation    Hip external rotation    Knee flexion 5 3+  Knee extension 5 4  Ankle dorsiflexion 5 5  Ankle plantarflexion    Ankle inversion    Ankle eversion     (Blank rows = not tested)  FUNCTIONAL TESTS:  30 seconds chair stand test:12 is poor for age and sex, 15 below average 18 average  2 minute walk test: 500 ft  Single leg stance: RT: 60   LT:  60   TREATMENT DATE: 02/14/23 Sitting: Importance of seated posture Lumbar support while sitting Standing: 3D hip excursion 10x each (pain with Lt lateral flexion, improved with reps) Supine: Bridge x 10  Bent knee lift x 10  Clam with GTB 10x 5 with ab set Hamstring stretch with rope 3x 30 Piriformis 90/90 with towel 1x 30 LTR 5x 10 Seated: Piriformis seated position 2x 30  01/31/23 Standing:  Lumbar extension x 10 Supine: Knee to chest 20 x 3 Hamstring stretch x 30 Bridge x 10  Bent knee lift x 10  Sit to stand x 10   01/29/23: Evaluation         Standing Lumbar Extension  - 10 reps  Seated Long Arc Quad  10 reps - 5second hold  Seated Transversus Abdominis Bracing   10 reps - 5 hold  Prone Press Up   5- 2 hold supine active LT hamstring stretch x 30 x 2  PATIENT EDUCATION:  Education details: HEP Person educated: Patient Education method: [employer] Technology [employer] Education comprehension: verbalized understanding  HOME EXERCISE PROGRAM: Access Code: [email or web address] URL: [email or web address] Date: 01/29/2023 Prepared by: Montie Metro  Exercises - Standing Lumbar Extension  - 5 x daily - 7 x weekly - 1 sets - 5-10 reps - Seated Long Arc Quad  - 3 x daily - 7 x weekly - 1 sets - 10 reps - 5second s hold - Seated Transversus Abdominis Bracing  - 5 x daily - 7 x weekly - 1 sets - 10 reps - 5 hold - Prone Press Up  - 2 x daily - 7 x weekly - 1 sets - 5-10 reps - 2 hold -supine active hamstring stretch x 30  x 3 2 x daily   01/31/23- Supine Bridge  - 2 x daily - 7 x weekly - 1 sets - 10 reps - 5 hold - Sit to Stand  - 2 x daily - 7 x weekly - 1 sets - 10 reps  ASSESSMENT:  CLINICAL IMPRESSION: Pt stated she sits a lot with work, educated on seated posture and assistance with lumbar support.  Session focus with core stability and stretches for pain control.  Min cueing to improve abdominal contraction prior movement and to slow down for controlled movements.  Added piriformis stretch with positive feedback, added to HEP with printout given.    OBJECTIVE IMPAIRMENTS: decreased activity tolerance, decreased balance, decreased ROM, decreased strength, and pain.   ACTIVITY LIMITATIONS: bending, sitting, standing, and locomotion level  PARTICIPATION LIMITATIONS: cleaning, driving, shopping, community activity, and occupation  REHAB POTENTIAL: Good  CLINICAL DECISION MAKING: Evolving/moderate  complexity  EVALUATION COMPLEXITY: Moderate   GOALS: Goals reviewed with patient? No  SHORT TERM GOALS: Target date: 02/12/22  PT to be I in HEP in order to decrease her pain to no greater than a 5 Baseline: Goal status: on-going  2.  Pt radicular sx to be no further than her mid thigh to demonstrate decreased nerve irritation  Baseline:  Goal status: on-going  3.  PT strength to increase 1/2 grade to allow pt to go up and down 10 steps in a reciprocal manner. Baseline:  Goal status:  on-going   LONG TERM GOALS: Target date: 02/27/23  PT to be I in an advanced HEP in order to decrease her pain to no greater than a 2 Baseline:  Goal status:  on-going  2.  PT to have no radicular sx  Baseline:  Goal status:  on-going  3.  PT mm strength to be increased one grade to be able to squat and return from a squatted position for housework.  Baseline:  Goal status:  on-going  4.  PT to be able to complete 18 sit to stand in 30 seconds to be in the average range for sex and age.  Baseline:  Goal status:  on-going  PLAN:  PT FREQUENCY: 2x/week  PT DURATION: 4 weeks  PLANNED INTERVENTIONS: 97110-Therapeutic exercises, 97530- Therapeutic activity, 97535- Self Care, 02859- Manual therapy, and Patient/Family education.  PLAN FOR NEXT SESSION: Continue with stabilization for strengthening of core and LE, body mechanic instruction and extension based exercises.   Augustin Mclean, LPTA/CLT; CBIS [phone]  Mclean Augustin Amble, PTA 02/14/2023, 1:31 PM  02/14/2023, 1:26 PM

## 2023-02-18 ENCOUNTER — Ambulatory Visit (HOSPITAL_COMMUNITY)
Admission: RE | Admit: 2023-02-18 | Discharge: 2023-02-18 | Disposition: A | Discharge status: Home / Self Care | Payer: Commercial Managed Care - PPO | Source: Ambulatory Visit | Attending: Hematology | Admitting: Hematology

## 2023-02-18 DIAGNOSIS — R928 Other abnormal and inconclusive findings on diagnostic imaging of breast: Secondary | ICD-10-CM | POA: Diagnosis not present

## 2023-02-18 DIAGNOSIS — R92321 Mammographic fibroglandular density, right breast: Secondary | ICD-10-CM | POA: Diagnosis not present

## 2023-02-18 DIAGNOSIS — C50911 Malignant neoplasm of unspecified site of right female breast: Secondary | ICD-10-CM | POA: Diagnosis not present

## 2023-02-19 ENCOUNTER — Encounter (HOSPITAL_COMMUNITY): Payer: Commercial Managed Care - PPO

## 2023-02-20 ENCOUNTER — Encounter (HOSPITAL_COMMUNITY): Payer: Self-pay

## 2023-02-20 ENCOUNTER — Ambulatory Visit: Payer: Commercial Managed Care - PPO | Admitting: Orthopaedic Surgery

## 2023-02-20 ENCOUNTER — Ambulatory Visit (HOSPITAL_COMMUNITY): Payer: Commercial Managed Care - PPO

## 2023-02-20 DIAGNOSIS — M5416 Radiculopathy, lumbar region: Secondary | ICD-10-CM | POA: Diagnosis not present

## 2023-02-20 NOTE — Therapy (Signed)
 OUTPATIENT PHYSICAL THERAPY THORACOLUMBAR Treatment   Patient Name: [name] MRN: [id number] DOB:01/03/1971, 53 y.o., female Today's Date: 02/20/2023  END OF SESSION:  PT End of Session - 02/20/23 0950     Visit Number 4    Number of Visits 8    Date for PT Re-Evaluation 02/28/23    Authorization Type [place]    Progress Note Due on Visit 8    PT Start Time 0951    PT Stop Time 1014    PT Time Calculation (min) 23 min    Activity Tolerance Patient tolerated treatment well    Behavior During Therapy [facility] for tasks assessed/performed               Past Medical History:  Diagnosis Date   Breast cancer (HCC) 2012,2018   rt breast   Breast disorder    cancer right breast   Cancer (HCC)    right lobular carcinoma in situ   Gastroparesis    controlled   Past Surgical History:  Procedure Laterality Date   BACK SURGERY  10/2002   lumbar hemilaminectomy, microdiscectomy L5-S1   BALLOON DILATION N/A 12/14/2021   Procedure: BALLOON DILATION;  Surgeon: Cindie Carlin POUR, DO;  [facility];  Service: Endoscopy;  Laterality: N/A;   BREAST BIOPSY Right 07/2016   malignant; waiting on MRI results   BREAST LUMPECTOMY Right 2012,2018   BREAST LUMPECTOMY WITH RADIOACTIVE SEED LOCALIZATION Right 10/08/2016   Procedure: RIGHT BREAST LUMPECTOMY WITH RADIOACTIVE SEED LOCALIZATION;  Surgeon: Gail Favorite, MD;  Location: [facility];  Service: General;  Laterality: Right;   BREAST SURGERY     COLONOSCOPY WITH PROPOFOL  N/A 12/14/2021   Dr. Cindie: non-bleeding internal hemorrhoids, three 4-7 mm polyps (tubular and hyperplastic). Surveillance in 5 years   ESOPHAGOGASTRODUODENOSCOPY  08/2007   normal esophagus, large gastric diverticulum, patent pylorus   ESOPHAGOGASTRODUODENOSCOPY (EGD) WITH PROPOFOL  N/A 12/14/2021   3 cm hiatal hernia, gastric diverticulum, four gastric fundic gland polyps, empiric dilation   POLYPECTOMY  12/14/2021   Procedure:  POLYPECTOMY;  Surgeon: Cindie Carlin POUR, DO;  [facility];  Service: Endoscopy;;   TUBAL LIGATION  2001   Patient Active Problem List   Diagnosis Date Noted   Acute bacterial bronchitis 03/13/2022   Erythrocytosis 03/13/2022   Dysphagia 11/13/2021   Hepatic steatosis 11/13/2021   Encounter for general adult medical examination with abnormal findings 09/06/2021   Seborrheic keratosis 05/30/2021   HLD (hyperlipidemia) 03/01/2021   Gastroesophageal reflux disease without esophagitis 03/01/2021   Morbid obesity (HCC) 03/01/2021   Prediabetes 10/26/2020   Peri-menopause 09/08/2020   Hypertension 06/12/2020   Anxiety 04/09/2019   History of right breast cancer 09/02/2016   Recurrent breast cancer, right (HCC) 09/02/2016   SUI (stress urinary incontinence, female) 09/02/2016   Lobular carcinoma in situ of right breast 12/03/2010    PCP: Suzzane Blanch  REFERRING PROVIDER: Lemond Stable   REFERRING DIAG:  Diagnosis  M54.50,M79.605 (ICD-10-CM) - Lumbar pain with radiation down left leg    Rationale for Evaluation and Treatment: Rehabilitation  THERAPY DIAG:  Diagnosis  M54.50,M79.605 (ICD-10-CM) - Lumbar pain with radiation down left leg    ONSET DATE: 12/8  SUBJECTIVE:  SUBJECTIVE STATEMENT:   Pt late for apt today.  Reports she was sore following last session, though stated she felt great the next morning.  Reports she didn't need Tylenol  this morning.  Radicular symptoms stopping at knee.    PERTINENT HISTORY:  Micro-disectomy in lumbar area  PAIN:  Are you having pain? Yes: 3/5 Pain location: back down past  left knee  Pain description: aching, numb  Aggravating factors: walking Relieving factors: TENS  PRECAUTIONS: None WEIGHT BEARING RESTRICTIONS: No  FALLS:  Has  patient fallen in last 6 months? No  LIVING ENVIRONMENT: Lives with: lives with their family Lives in: House/apartment Stairs: Yes: Internal: 10 steps; on right going up can not lead with her left leg at this time. Has following equipment at home: None  OCCUPATION: PT experience   PLOF: Independent  PATIENT GOALS: less pain   NEXT MD VISIT: after therapy   OBJECTIVE:  Note: Objective measures were completed at Evaluation unless otherwise noted.  DIAGNOSTIC FINDINGS:  Impression: negative lumbar spine, no acute findings.  COGNITION: Overall cognitive status: Within functional limits for tasks assessed    Lt  hamstring 150 ; Rt 165  LUMBAR ROM:   AROM eval  Flexion Able to touch toes reps no change  Extension 28; reps improves   Right lateral flexion   Left lateral flexion   Right rotation   Left rotation    (Blank rows = not tested)   LOWER EXTREMITY MMT:    MMT Right eval Left eval  Hip flexion 5 3+  Hip extension 3 3  Hip abduction 4+ 4  Hip adduction    Hip internal rotation    Hip external rotation    Knee flexion 5 3+  Knee extension 5 4  Ankle dorsiflexion 5 5  Ankle plantarflexion    Ankle inversion    Ankle eversion     (Blank rows = not tested)  FUNCTIONAL TESTS:  30 seconds chair stand test:12 is poor for age and sex, 15 below average 18 average  2 minute walk test: 500 ft  Single leg stance: RT: 60   LT:  60   TREATMENT DATE: 02/20/23: Standing:   3D hip excursion 10x each  Prone:  Heel squeeze hip extension 10x  Supine: Hamstring stretch with rope 2x 30 Piriformis stretch with towel 2x 30  02/14/23 Sitting: Importance of seated posture Lumbar support while sitting Standing: 3D hip excursion 10x each (pain with Lt lateral flexion, improved with reps) Supine: Bridge x 10  Bent knee lift x 10  Clam with GTB 10x 5 with ab set Hamstring stretch with rope 3x 30 Piriformis 90/90 with towel 1x 30 LTR 5x  10 Seated: Piriformis seated position 2x 30  01/31/23 Standing:  Lumbar extension x 10 Supine: Knee to chest 20 x 3 Hamstring stretch x 30 Bridge x 10  Bent knee lift x 10  Sit to stand x 10   01/29/23: Evaluation        Standing Lumbar Extension  - 10 reps  Seated Long Arc Quad  10 reps - 5second hold  Seated Transversus Abdominis Bracing   10 reps - 5 hold  Prone Press Up   5- 2 hold supine active LT hamstring stretch x 30 x 2  PATIENT EDUCATION:  Education details: HEP Person educated: Patient Education method: Chief Technology Officer Education comprehension: verbalized understanding  HOME EXERCISE PROGRAM: Access Code: [email or web address] URL: [email or web address] Date: 01/29/2023 Prepared by: Montie Metro  Exercises - Standing Lumbar Extension  - 5 x daily - 7 x weekly - 1 sets - 5-10 reps - Seated Long Arc Quad  - 3 x daily - 7 x weekly - 1 sets - 10 reps - 5second s hold - Seated Transversus Abdominis Bracing  - 5 x daily - 7 x weekly - 1 sets - 10 reps - 5 hold - Prone Press Up  - 2 x daily - 7 x weekly - 1 sets - 5-10 reps - 2 hold -supine active hamstring stretch x 30  x 3 2 x daily   01/31/23- Supine Bridge  - 2 x daily - 7 x weekly - 1 sets - 10 reps - 5 hold - Sit to Stand  - 2 x daily - 7 x weekly - 1 sets - 10 reps  02/20/23: - Hooklying Clamshell with Resistance  - 2 x daily - 7 x weekly - 1 sets - 10 reps - 5 hold - Hooklying Hamstring Stretch with Strap  - 7 x weekly - 3 sets - 3 reps - 30 hold - Supine Figure 4 Piriformis Stretch  - 2 x daily - 7 x weekly - 1 sets - 3 reps - 30 hold - Seated Piriformis Stretch with Trunk Bend  - 3 x daily - 7 x weekly - 1 sets - 3 reps - 30 hold - Prone Heel Squeeze  - 2 x daily - 7 x weekly - 1 sets - 10 reps - 5 hold - Prone Hip Extension  - 2 x daily - 7 x weekly - 1 sets -  10 reps - 3 hold  ASSESSMENT:  CLINICAL IMPRESSION: Pt late for apt today.  Session focus on extension based exercises for gluteal strengthening and to reduce radicular symptoms.  Added prone gluteal strengthening exercises to HEP.  Pt tolerated well to session with no reports of increased pain.     OBJECTIVE IMPAIRMENTS: decreased activity tolerance, decreased balance, decreased ROM, decreased strength, and pain.   ACTIVITY LIMITATIONS: bending, sitting, standing, and locomotion level  PARTICIPATION LIMITATIONS: cleaning, driving, shopping, community activity, and occupation  REHAB POTENTIAL: Good  CLINICAL DECISION MAKING: Evolving/moderate complexity  EVALUATION COMPLEXITY: Moderate   GOALS: Goals reviewed with patient? No  SHORT TERM GOALS: Target date: 02/12/22  PT to be I in HEP in order to decrease her pain to no greater than a 5 Baseline: Goal status: on-going  2.  Pt radicular sx to be no further than her mid thigh to demonstrate decreased nerve irritation  Baseline:  Goal status: on-going  3.  PT strength to increase 1/2 grade to allow pt to go up and down 10 steps in a reciprocal manner. Baseline:  Goal status:  on-going   LONG TERM GOALS: Target date: 02/27/23  PT to be I in an advanced HEP in order to decrease her pain to no greater than a 2 Baseline:  Goal status:  on-going  2.  PT to have no radicular sx  Baseline:  Goal status:  on-going  3.  PT mm strength to be increased one grade to be able to squat and return from a squatted position for housework.  Baseline:  Goal status:  on-going  4.  PT to be able to complete 18 sit to stand in  30 seconds to be in the average range for sex and age.  Baseline:  Goal status:  on-going  PLAN:  PT FREQUENCY: 2x/week  PT DURATION: 4 weeks  PLANNED INTERVENTIONS: 97110-Therapeutic exercises, 97530- Therapeutic activity, 97535- Self Care, 02859- Manual therapy, and Patient/Family education.  PLAN FOR NEXT  SESSION: Continue with stabilization for strengthening of core and LE, body mechanic instruction and extension based exercises.   [name], LPTA/CLT; CBIS [phone]  [name], PTA 02/20/2023, 1:25 PM  02/20/2023, 1:25 PM

## 2023-02-25 ENCOUNTER — Encounter: Payer: Self-pay | Admitting: *Deleted

## 2023-02-25 ENCOUNTER — Other Ambulatory Visit: Payer: Self-pay | Admitting: *Deleted

## 2023-02-25 DIAGNOSIS — D0501 Lobular carcinoma in situ of right breast: Secondary | ICD-10-CM

## 2023-02-25 NOTE — Progress Notes (Signed)
 Per Dr. Ellin Saba, order placed for MRI guided breast biopsy right breast based on previous MRI results.  Patient aware and will call central scheduling for appointment.

## 2023-02-26 ENCOUNTER — Ambulatory Visit (HOSPITAL_COMMUNITY): Payer: Commercial Managed Care - PPO | Admitting: Physical Therapy

## 2023-02-26 DIAGNOSIS — M5416 Radiculopathy, lumbar region: Secondary | ICD-10-CM

## 2023-02-26 NOTE — Therapy (Signed)
 OUTPATIENT PHYSICAL THERAPY THORACOLUMBAR Treatment   Patient Name: [name] MRN: [id number] DOB:01/11/1971, 53 y.o., female Today's Date: 02/26/2023  END OF SESSION:  PT End of Session - 02/26/23 1014     Visit Number 5    Number of Visits 8    Date for PT Re-Evaluation 02/28/23    Authorization Type [place]    Progress Note Due on Visit 8    PT Start Time 0935    PT Stop Time 1015    PT Time Calculation (min) 40 min    Activity Tolerance Patient tolerated treatment well    Behavior During Therapy [facility] for tasks assessed/performed                Past Medical History:  Diagnosis Date   Breast cancer (HCC) 2012,2018   rt breast   Breast disorder    cancer right breast   Cancer (HCC)    right lobular carcinoma in situ   Gastroparesis    controlled   Past Surgical History:  Procedure Laterality Date   BACK SURGERY  10/2002   lumbar hemilaminectomy, microdiscectomy L5-S1   BALLOON DILATION N/A 12/14/2021   Procedure: BALLOON DILATION;  Surgeon: Vinetta Greening, DO;  Location[facility];  Service: Endoscopy;  Laterality: N/A;   BREAST BIOPSY Right 07/2016   malignant; waiting on MRI results   BREAST LUMPECTOMY Right 2012,2018   BREAST LUMPECTOMY WITH RADIOACTIVE SEED LOCALIZATION Right 10/08/2016   Procedure: RIGHT BREAST LUMPECTOMY WITH RADIOACTIVE SEED LOCALIZATION;  Surgeon: Boyce Byes, MD;  Location: [facility];  Service: General;  Laterality: Right;   BREAST SURGERY     COLONOSCOPY WITH PROPOFOL  N/A 12/14/2021   Dr. Mordechai April: non-bleeding internal hemorrhoids, three 4-7 mm polyps (tubular and hyperplastic). Surveillance in 5 years   ESOPHAGOGASTRODUODENOSCOPY  08/2007   normal esophagus, large gastric diverticulum, patent pylorus   ESOPHAGOGASTRODUODENOSCOPY (EGD) WITH PROPOFOL  N/A 12/14/2021   3 cm hiatal hernia, gastric diverticulum, four gastric fundic gland polyps, empiric dilation   POLYPECTOMY  12/14/2021   Procedure:  POLYPECTOMY;  Surgeon: Vinetta Greening, DO;  Location: [facility];  Service: Endoscopy;;   TUBAL LIGATION  2001   Patient Active Problem List   Diagnosis Date Noted   Acute bacterial bronchitis 03/13/2022   Erythrocytosis 03/13/2022   Dysphagia 11/13/2021   Hepatic steatosis 11/13/2021   Encounter for general adult medical examination with abnormal findings 09/06/2021   Seborrheic keratosis 05/30/2021   HLD (hyperlipidemia) 03/01/2021   Gastroesophageal reflux disease without esophagitis 03/01/2021   Morbid obesity (HCC) 03/01/2021   Prediabetes 10/26/2020   Peri-menopause 09/08/2020   Hypertension 06/12/2020   Anxiety 04/09/2019   History of right breast cancer 09/02/2016   Recurrent breast cancer, right (HCC) 09/02/2016   SUI (stress urinary incontinence, female) 09/02/2016   Lobular carcinoma in situ of right breast 12/03/2010    PCP: Cleola Dach  REFERRING PROVIDER: Pleasant Brilliant   REFERRING DIAG:  Diagnosis  M54.50,M79.605 (ICD-10-CM) - Lumbar pain with radiation down left leg    Rationale for Evaluation and Treatment: Rehabilitation  THERAPY DIAG:  Diagnosis  M54.50,M79.605 (ICD-10-CM) - Lumbar pain with radiation down left leg    ONSET DATE: 12/8  SUBJECTIVE:  SUBJECTIVE STATEMENT:   Pt states overall feeling better; notes improvement with walking.     PERTINENT HISTORY:  Micro-disectomy in lumbar area  PAIN:  Are you having pain? Yes: 2/5 Pain location: back down past  left knee  Pain description: aching, numb  Aggravating factors: walking Relieving factors: TENS  PRECAUTIONS: None WEIGHT BEARING RESTRICTIONS: No  FALLS:  Has patient fallen in last 6 months? No  LIVING ENVIRONMENT: Lives with: lives with their family Lives in: House/apartment Stairs: Yes:  Internal: 10 steps; on right going up can not lead with her left leg at this time. Has following equipment at home: None  OCCUPATION: PT experience   PLOF: Independent  PATIENT GOALS: less pain   NEXT MD VISIT: after therapy   OBJECTIVE:  Note: Objective measures were completed at Evaluation unless otherwise noted.  DIAGNOSTIC FINDINGS:  Impression: negative lumbar spine, no acute findings.  COGNITION: Overall cognitive status: Within functional limits for tasks assessed    Lt  hamstring 150 ; Rt 165  LUMBAR ROM:   AROM eval  Flexion Able to touch toes reps no change  Extension 28; reps improves   Right lateral flexion   Left lateral flexion   Right rotation   Left rotation    (Blank rows = not tested)   LOWER EXTREMITY MMT:    MMT Right eval Left eval  Hip flexion 5 3+  Hip extension 3 3  Hip abduction 4+ 4  Hip adduction    Hip internal rotation    Hip external rotation    Knee flexion 5 3+  Knee extension 5 4  Ankle dorsiflexion 5 5  Ankle plantarflexion    Ankle inversion    Ankle eversion     (Blank rows = not tested)  FUNCTIONAL TESTS:  30 seconds chair stand test:12 is poor for age and sex, 15 below average 18 average  2 minute walk test: 500 ft  Single leg stance: RT: 60"   LT:  60"   TREATMENT DATE: 02/25/22 Standing: Nustep: level 3 x 7 minutes  Wall arch Functional squat Tband with green theraband  Rows x 10 Scapular retraction x 10 Shoulder extension x 10  All four: Opposite arm/leg raise x 10 Mad cat/ old horse x 10 Sitting: Wback with 3# x  X to v x 10 Money back x 19   02/20/23: Standing:   3D hip excursion 10x each  Prone:  Heel squeeze hip extension 10x  Supine: Hamstring stretch with rope 2x 30" Piriformis stretch with towel 2x 30"  02/14/23 Sitting: Importance of seated posture Lumbar support while sitting Standing: 3D hip excursion 10x each (pain with Lt lateral flexion, improved with reps) Supine: Bridge  x 10  Bent knee lift x 10  Clam with GTB 10x 5" with ab set Hamstring stretch with rope 3x 30" Piriformis 90/90 with towel 1x 30" LTR 5x 10" Seated: Piriformis seated position 2x 30"  01/31/23 Standing:  Lumbar extension x 10 Supine: Knee to chest 20" x 3 Hamstring stretch x 30" Bridge x 10  Bent knee lift x 10  Sit to stand x 10   01/29/23: Evaluation        Standing Lumbar Extension  - 10 reps  Seated Long Arc Quad  10 reps - 5second hold  Seated Transversus Abdominis Bracing   10 reps - 5 hold  Prone Press Up   5- 2" hold supine active LT hamstring stretch x 30" x 2  PATIENT EDUCATION:  Education details: HEP Person educated: Patient Education method: Chief Technology Officer Education comprehension: verbalized understanding  HOME EXERCISE PROGRAM: Wall arch Postural theraband exercises W back  Access Code: [email or web address] URL: [email or web address] Date: 01/29/2023 Prepared by: Leodis Rainwater  Exercises - Standing Lumbar Extension  - 5 x daily - 7 x weekly - 1 sets - 5-10 reps - Seated Long Arc Quad  - 3 x daily - 7 x weekly - 1 sets - 10 reps - 5second s hold - Seated Transversus Abdominis Bracing  - 5 x daily - 7 x weekly - 1 sets - 10 reps - 5 hold - Prone Press Up  - 2 x daily - 7 x weekly - 1 sets - 5-10 reps - 2" hold -supine active hamstring stretch x 30"  x 3 2 x daily   01/31/23- Supine Bridge  - 2 x daily - 7 x weekly - 1 sets - 10 reps - 5" hold - Sit to Stand  - 2 x daily - 7 x weekly - 1 sets - 10 reps  02/20/23: - Hooklying Clamshell with Resistance  - 2 x daily - 7 x weekly - 1 sets - 10 reps - 5" hold - Hooklying Hamstring Stretch with Strap  - 7 x weekly - 3 sets - 3 reps - 30" hold - Supine Figure 4 Piriformis Stretch  - 2 x daily - 7 x weekly - 1 sets - 3 reps - 30" hold - Seated Piriformis Stretch with Trunk Bend   - 3 x daily - 7 x weekly - 1 sets - 3 reps - 30" hold - Prone Heel Squeeze  - 2 x daily - 7 x weekly - 1 sets - 10 reps - 5" hold - Prone Hip Extension  - 2 x daily - 7 x weekly - 1 sets - 10 reps - 3" hold  ASSESSMENT:  CLINICAL IMPRESSION: Pt is continues to improve in pain.  Therapist advanced pt HEP with postural theraband and wall arch exercises.  Pt still has pain going down to her knee but not as frequent.  Pt continues to benefit from skilled PT.  Pt tolerated well to session with no reports of increased pain.     OBJECTIVE IMPAIRMENTS: decreased activity tolerance, decreased balance, decreased ROM, decreased strength, and pain.   ACTIVITY LIMITATIONS: bending, sitting, standing, and locomotion level  PARTICIPATION LIMITATIONS: cleaning, driving, shopping, community activity, and occupation  REHAB POTENTIAL: Good  CLINICAL DECISION MAKING: Evolving/moderate complexity  EVALUATION COMPLEXITY: Moderate   GOALS: Goals reviewed with patient? No  SHORT TERM GOALS: Target date: 02/12/22  PT to be I in HEP in order to decrease her pain to no greater than a 5 Baseline: Goal status: on-going  2.  Pt radicular sx to be no further than her mid thigh to demonstrate decreased nerve irritation  Baseline:  Goal status: on-going  3.  PT strength to increase 1/2 grade to allow pt to go up and down 10 steps in a reciprocal manner. Baseline:  Goal status:  on-going   LONG TERM GOALS: Target date: 02/27/23  PT to be I in an advanced HEP in order to decrease her pain to no greater than a 2 Baseline:  Goal status:  on-going  2.  PT to have no radicular sx  Baseline:  Goal status:  on-going  3.  PT mm strength to be increased one grade to be able to squat and return from a squatted position for housework.  Baseline:  Goal status:  on-going  4.  PT to be able to complete 18 sit to stand in 30 seconds to be in the average range for sex and age.  Baseline:  Goal status:   on-going  PLAN:  PT FREQUENCY: 2x/week  PT DURATION: 4 weeks  PLANNED INTERVENTIONS: 97110-Therapeutic exercises, 97530- Therapeutic activity, 97535- Self Care, [address]- Manual therapy, and Patient/Family education.  PLAN FOR NEXT SESSION: Continue with stabilization for strengthening of core and LE, body mechanic instruction and extension based exercises.   Leodis Rainwater, PT CLT [phone]     02/26/2023, 10:16 AM

## 2023-02-26 NOTE — Therapy (Deleted)
 [facility] at [facility] [address] [place], [place], [address] Phone: [phone]   Fax:  [phone]  Patient Details  Name: [name] MRN: [id number] Date of Birth: 1970/09/25 Referring Provider:  Meldon Sport, MD  Encounter Date: 02/26/2023   Nesbit Michon,CINDY, PT 02/26/2023, 9:39 AM  [facility] at [facility] [address] [place], [place], [address] Phone: [phone]   Fax:  [phone]

## 2023-02-28 ENCOUNTER — Encounter (HOSPITAL_COMMUNITY): Payer: Commercial Managed Care - PPO

## 2023-02-28 ENCOUNTER — Other Ambulatory Visit: Payer: Self-pay | Admitting: Hematology

## 2023-02-28 DIAGNOSIS — D0501 Lobular carcinoma in situ of right breast: Secondary | ICD-10-CM

## 2023-03-12 ENCOUNTER — Ambulatory Visit
Admission: RE | Admit: 2023-03-12 | Discharge: 2023-03-12 | Disposition: A | Discharge status: Home / Self Care | Payer: Commercial Managed Care - PPO | Source: Ambulatory Visit | Attending: Hematology | Admitting: Hematology

## 2023-03-12 DIAGNOSIS — Z853 Personal history of malignant neoplasm of breast: Secondary | ICD-10-CM

## 2023-03-12 DIAGNOSIS — N6011 Diffuse cystic mastopathy of right breast: Secondary | ICD-10-CM | POA: Diagnosis not present

## 2023-03-12 DIAGNOSIS — D0501 Lobular carcinoma in situ of right breast: Secondary | ICD-10-CM

## 2023-03-12 DIAGNOSIS — N6314 Unspecified lump in the right breast, lower inner quadrant: Secondary | ICD-10-CM | POA: Diagnosis not present

## 2023-03-12 MED ORDER — GADOPICLENOL 0.5 MMOL/ML IV SOLN
10.0000 mL | Freq: Once | INTRAVENOUS | Status: AC | PRN
  Administered 2023-03-12: 10 mL via INTRAVENOUS

## 2023-03-13 LAB — SURGICAL PATHOLOGY

## 2023-03-15 HISTORY — PX: BREAST BIOPSY: SHX20

## 2023-03-18 NOTE — Progress Notes (Incomplete)
[facility] [address], [place] [address]    Clinic Day:  03/18/2023  Referring physician: Anabel Halon, MD  Patient Care Team: Anabel Halon, MD as PCP - General (Internal Medicine)   ASSESSMENT & PLAN:   Assessment: 1.  Recurrent right breast LCIS: -Initially diagnosed with right breast LCIS in 2012, status post lumpectomy, ER/PR positive, did not take tamoxifen. -Recurrent right breast LCIS, status post lumpectomy on 10/08/2016. -Tamoxifen started in September 2018, self discontinued around June 2022 due to intolerance from hot flashes.    Plan: 1.  Recurrent right breast LCIS: - She had a mammogram done on 02/06/2022 which was BI-RADS Category 0. - MRI breast from 02/07/2023: 6-7 mm enhancing mass in the lower inner quadrant of the right breast.  No abnormal appearing lymph nodes.  No evidence of left breast primary. - She has a cyst in the left axilla, likely from folliculitis. - She has appointment for diagnostic mammogram and ultrasound scheduled next week.  This will be followed by biopsy. - I will see her back after the biopsy or surgery if needed.   2.  Erythrocytosis: - No aqua genic pruritus or vasomotor symptoms.  No history of sleep apnea.  Quit smoking at age 20, smoked for 12 years.  She is no longer on diuretic. - She has mildly elevated hemoglobin and hematocrit since September 2023. - Latest CBC with hemoglobin 15.4 and hematocrit 47.  Serum EPO level is 12.  RBC count normal. - If there is any worsening we will consider JAK2 V617F testing.    No orders of the defined types were placed in this encounter.     I,Katie Daubenspeck,acting as a Neurosurgeon for Doreatha Massed, MD.,have documented all relevant documentation on the behalf of Doreatha Massed, MD,as directed by  Doreatha Massed, MD while in the presence of Doreatha Massed, MD.   ***  Mickie Bail   2/4/20253:45 PM  CHIEF COMPLAINT:   Diagnosis:  right breast LCIS   Cancer Staging  No matching staging information was found for the patient.    Prior Therapy: 1. Right lumpectomy in 2012 2. Right lumpectomy on 10/08/2016 3. Tamoxifen from September 2018 to June 2022  Current Therapy:  surveillance   HISTORY OF PRESENT ILLNESS:   Oncology History   No history exists.     INTERVAL HISTORY:   [name] is a 53 y.o. female presenting to clinic today for follow up of right breast LCIS. She was last seen by me on 02/11/23.  Since her last visit, she underwent right breast MM and [place] on 02/18/23 to further evaluate a 6-7 mm mass in LIQ of right breast seen on recent MRI. This showed no evidence of malignancy, specifically no definite sonographic correlate for mass seen on MRI.  She proceeded to MRI-guided biopsy of the right breast on 03/12/23. Pathology was benign.  Today, she states that she is doing well overall. Her appetite level is at ***%. Her energy level is at ***%.  PAST MEDICAL HISTORY:   Past Medical History: Past Medical History:  Diagnosis Date   Breast cancer (HCC) 2012,2018   rt breast   Breast disorder    cancer right breast   Cancer (HCC)    right lobular carcinoma in situ   Gastroparesis    controlled    Surgical History: Past Surgical History:  Procedure Laterality Date   BACK SURGERY  10/2002   lumbar hemilaminectomy, microdiscectomy L5-S1   BALLOON DILATION N/A  12/14/2021   Procedure: BALLOON DILATION;  Surgeon: Lanelle Bal, DO;  [facility]: [facility];  Service: Endoscopy;  Laterality: N/A;   BREAST BIOPSY Right 07/2016   malignant; waiting on MRI results   BREAST LUMPECTOMY Right 2012,2018   BREAST LUMPECTOMY WITH RADIOACTIVE SEED LOCALIZATION Right 10/08/2016   Procedure: RIGHT BREAST LUMPECTOMY WITH RADIOACTIVE SEED LOCALIZATION;  Surgeon: Claud Kelp, MD;  Location: [facility];  Service: General;  Laterality: Right;   BREAST SURGERY     COLONOSCOPY WITH PROPOFOL N/A  12/14/2021   Dr. Marletta Lor: non-bleeding internal hemorrhoids, three 4-7 mm polyps (tubular and hyperplastic). Surveillance in 5 years   ESOPHAGOGASTRODUODENOSCOPY  08/2007   normal esophagus, large gastric diverticulum, patent pylorus   ESOPHAGOGASTRODUODENOSCOPY (EGD) WITH PROPOFOL N/A 12/14/2021   3 cm hiatal hernia, gastric diverticulum, four gastric fundic gland polyps, empiric dilation   POLYPECTOMY  12/14/2021   Procedure: POLYPECTOMY;  Surgeon: Lanelle Bal, DO;  Location: [facility];  Service: Endoscopy;;   TUBAL LIGATION  2001    Social History: Social History   Socioeconomic History   Marital status: Married    Spouse name: Not on file   Number of children: Not on file   Years of education: Not on file   Highest education level: Not on file  Occupational History   Not on file  Tobacco Use   Smoking status: Former    Current packs/day: 0.00    Average packs/day: 1 pack/day for 16.0 years (16.0 ttl pk-yrs)    Types: Cigarettes    Start date: 08/12/1984    Quit date: 08/12/2000    Years since quitting: 22.6   Smokeless tobacco: Never   Tobacco comments:    quit 10 yrs  Vaping Use   Vaping status: Never Used  Substance and Sexual Activity   Alcohol use: Yes    Comment: occasionally   Drug use: No   Sexual activity: Yes    Birth control/protection: Surgical    Comment: tubal  Other Topics Concern   Not on file  Social History Narrative   Not on file   Social Drivers of Health   Financial Resource Strain: Low Risk  (09/08/2020)   Overall Financial Resource Strain (CARDIA)    Difficulty of Paying Living Expenses: Not hard at all  Food Insecurity: No Food Insecurity (09/08/2020)   Hunger Vital Sign    Worried About Running Out of Food in the Last Year: Never true    Ran Out of Food in the Last Year: Never true  Transportation Needs: No Transportation Needs (09/08/2020)   PRAPARE - Administrator, Civil Service (Medical): No    Lack of  Transportation (Non-Medical): No  Physical Activity: Insufficiently Active (09/08/2020)   Exercise Vital Sign    Days of Exercise per Week: 1 day    Minutes of Exercise per Session: 30 min  Stress: No Stress Concern Present (09/08/2020)   [employer] of Occupational Health - Occupational Stress Questionnaire    Feeling of Stress : Only a little  Social Connections: Moderately Integrated (09/08/2020)   Social Connection and Isolation Panel [NHANES]    Frequency of Communication with Friends and Family: More than three times a week    Frequency of Social Gatherings with Friends and Family: Three times a week    Attends Religious Services: Never    Active Member of Clubs or Organizations: Yes    Attends Banker Meetings: More than 4 times per year  Marital Status: Married  Catering manager Violence: Not At Risk (09/08/2020)   Humiliation, Afraid, Rape, and Kick questionnaire    Fear of Current or Ex-Partner: No    Emotionally Abused: No    Physically Abused: No    Sexually Abused: No    Family History: Family History  Problem Relation Age of Onset   Hypertension Mother    Hyperlipidemia Mother    Diverticulitis Mother    Hernia Mother    Heart disease Father        arrythmia   Hypertension Father    Arthritis Brother    Heart attack Maternal Grandmother    Aneurysm Maternal Grandfather    Congestive Heart Failure Paternal Grandmother    Cancer Maternal Uncle        pancreatic   Colon cancer Neg Hx    Colon polyps Neg Hx     Current Medications:  Current Outpatient Medications:    cyclobenzaprine (FLEXERIL) 10 MG tablet, Take 1 tablet (10 mg total) by mouth at bedtime. One tablet every night at bedtime as needed for spasm., Disp: 30 tablet, Rfl: 0   ibuprofen (ADVIL) 200 MG tablet, Take 800 mg by mouth every 8 (eight) hours as needed for moderate pain., Disp: , Rfl:    promethazine-dextromethorphan (PROMETHAZINE-DM) 6.25-15 MG/5ML syrup, Take 5 mLs by  mouth 4 (four) times daily as needed for cough., Disp: 118 mL, Rfl: 0   Allergies: Allergies  Allergen Reactions   Augmentin [Amoxicillin-Pot Clavulanate] Hives    REVIEW OF SYSTEMS:   Review of Systems  Constitutional:  Negative for chills, fatigue and fever.  HENT:   Negative for lump/mass, mouth sores, nosebleeds, sore throat and trouble swallowing.   Eyes:  Negative for eye problems.  Respiratory:  Negative for cough and shortness of breath.   Cardiovascular:  Negative for chest pain, leg swelling and palpitations.  Gastrointestinal:  Negative for abdominal pain, constipation, diarrhea, nausea and vomiting.  Genitourinary:  Negative for bladder incontinence, difficulty urinating, dysuria, frequency, hematuria and nocturia.   Musculoskeletal:  Negative for arthralgias, back pain, flank pain, myalgias and neck pain.  Skin:  Negative for itching and rash.  Neurological:  Negative for dizziness, headaches and numbness.  Hematological:  Does not bruise/bleed easily.  Psychiatric/Behavioral:  Negative for depression, sleep disturbance and suicidal ideas. The patient is not nervous/anxious.   All other systems reviewed and are negative.    VITALS:   There were no vitals taken for this visit.  Wt Readings from Last 3 Encounters:  02/11/23 237 lb (107.5 kg)  01/23/23 237 lb (107.5 kg)  03/13/22 222 lb (100.7 kg)    There is no height or weight on file to calculate BMI.  Performance status (ECOG): {CHL ONC [id number]  PHYSICAL EXAM:   Physical Exam Vitals and nursing note reviewed. Exam conducted with a chaperone present.  Constitutional:      Appearance: Normal appearance.  Cardiovascular:     Rate and Rhythm: Normal rate and regular rhythm.     Pulses: Normal pulses.     Heart sounds: Normal heart sounds.  Pulmonary:     Effort: Pulmonary effort is normal.     Breath sounds: Normal breath sounds.  Abdominal:     Palpations: Abdomen is soft. There is no  hepatomegaly, splenomegaly or mass.     Tenderness: There is no abdominal tenderness.  Musculoskeletal:     Right lower leg: No edema.     Left lower leg: No edema.  Lymphadenopathy:  Cervical: No cervical adenopathy.     Right cervical: No superficial, deep or posterior cervical adenopathy.    Left cervical: No superficial, deep or posterior cervical adenopathy.     Upper Body:     Right upper body: No supraclavicular or axillary adenopathy.     Left upper body: No supraclavicular or axillary adenopathy.  Neurological:     General: No focal deficit present.     Mental Status: She is alert and oriented to person, place, and time.  Psychiatric:        Mood and Affect: Mood normal.        Behavior: Behavior normal.     LABS:   CBC     Component Value Date/Time   WBC 5.9 02/07/2023 1249   RBC 5.06 02/07/2023 1249   HGB 15.4 (H) 02/07/2023 1249   HGB 15.5 09/06/2021 1021   HGB 13.6 04/01/2012 1327   HCT 47.0 (H) 02/07/2023 1249   HCT 45.9 09/06/2021 1021   HCT 39.8 04/01/2012 1327   PLT 215 02/07/2023 1249   PLT 218 09/06/2021 1021   MCV 92.9 02/07/2023 1249   MCV 91 09/06/2021 1021   MCV 89.8 04/01/2012 1327   MCH 30.4 02/07/2023 1249   MCHC 32.8 02/07/2023 1249   RDW 12.5 02/07/2023 1249   RDW 12.5 09/06/2021 1021   RDW 12.6 04/01/2012 1327   LYMPHSABS 2.2 02/07/2023 1249   LYMPHSABS 1.7 09/06/2021 1021   LYMPHSABS 1.8 04/01/2012 1327   MONOABS 0.4 02/07/2023 1249   MONOABS 0.5 04/01/2012 1327   EOSABS 0.2 02/07/2023 1249   EOSABS 0.0 09/06/2021 1021   BASOSABS 0.0 02/07/2023 1249   BASOSABS 0.0 09/06/2021 1021   BASOSABS 0.0 04/01/2012 1327    CMP      Component Value Date/Time   NA 137 02/07/2023 1249   NA 137 09/06/2021 1021   NA 140 04/01/2012 1327   K 4.0 02/07/2023 1249   K 3.5 04/01/2012 1327   CL 107 02/07/2023 1249   CL 106 04/01/2012 1327   CO2 24 02/07/2023 1249   CO2 23 04/01/2012 1327   GLUCOSE 99 02/07/2023 1249   GLUCOSE 98  04/01/2012 1327   BUN 14 02/07/2023 1249   BUN 11 09/06/2021 1021   BUN 13.8 04/01/2012 1327   CREATININE 0.78 02/07/2023 1249   CREATININE 1.0 04/01/2012 1327   CALCIUM 9.4 02/07/2023 1249   CALCIUM 9.2 04/01/2012 1327   PROT 7.6 02/07/2023 1249   PROT 7.1 09/06/2021 1021   PROT 7.1 04/01/2012 1327   ALBUMIN 4.2 02/07/2023 1249   ALBUMIN 4.1 09/06/2021 1021   ALBUMIN 3.6 04/01/2012 1327   AST 23 02/07/2023 1249   AST 18 04/01/2012 1327   ALT 29 02/07/2023 1249   ALT 20 04/01/2012 1327   ALKPHOS 63 02/07/2023 1249   ALKPHOS 71 04/01/2012 1327   BILITOT 0.7 02/07/2023 1249   BILITOT 0.5 09/06/2021 1021   BILITOT 0.50 04/01/2012 1327   GFRNONAA >60 02/07/2023 1249   GFRAA >60 05/17/2019 1132     No results found for: "CEA1", "CEA" / No results found for: "CEA1", "CEA" No results found for: "PSA1" No results found for: "ZOX096" No results found for: "CAN125"  No results found for: "TOTALPROTELP", "ALBUMINELP", "A1GS", "A2GS", "BETS", "BETA2SER", "GAMS", "MSPIKE", "SPEI" No results found for: "TIBC", "FERRITIN", "IRONPCTSAT" No results found for: "LDH"   STUDIES:   MR RT BREAST BX W LOC DEV 1ST LESION IMAGE BX SPEC MR GUIDE Addendum Date: 03/13/2023 ADDENDUM REPORT:  03/13/2023 13:40 ADDENDUM: Pathology revealed BENIGN BREAST TISSUE WITH MILD ADENOSIS AND ASSOCIATED COLUMNAR CELL CHANGE AND FIBROCYSTIC CHANGE WITH ADJACENT MILD PSEUDOANGIOMATOUS STROMAL HYPERPLASIA (PASH) LIKE CHANGES, FOCAL CALCIFICATIONS PRESENT of the RIGHT breast, lower inner quadrant, (barbell clip). This was found to be concordant by Dr. Amie Portland. Pathology results were discussed with the patient by telephone. The patient reported doing well after the biopsy with tenderness, bleeding and bruising at the site. Post biopsy instructions and care were reviewed and questions were answered. The patient was encouraged to call [facility] for any additional concerns. My direct phone  number was provided. The patient was instructed to return for a bilateral breast MRI in 6 months, per protocol, and to continue with annual mammography. Pathology results reported by Rene Kocher, RN on 03/13/2023. Electronically Signed   By: Amie Portland M.D.   On: 03/13/2023 13:40   Result Date: 03/13/2023 CLINICAL DATA:  Patient presents for MRI guided core biopsy of a right breast mass. EXAM: MRI GUIDED CORE NEEDLE BIOPSY OF THE RIGHT BREAST TECHNIQUE: Multiplanar, multisequence MR imaging of the right breast was performed both before and after administration of intravenous contrast. CONTRAST:  10 mL of Vueway. COMPARISON:  Previous exam(s). FINDINGS: I met with the patient, and we discussed the procedure of MRI guided biopsy, including risks, benefits, and alternatives. Specifically, we discussed the risks of infection, bleeding, tissue injury, clip migration, and inadequate sampling. Informed, written consent was given. The usual time out protocol was performed immediately prior to the procedure. Using sterile technique, 1% Lidocaine, MRI guidance, and a 9 gauge vacuum assisted device, biopsy was performed of the 7 mm mass in the lower inner quadrant of the right breast using a medial approach. At the conclusion of the procedure, a barbell shaped tissue marker clip was deployed into the biopsy cavity. Follow-up 2-view mammogram was performed and dictated separately. IMPRESSION: MRI guided biopsy of the right breast.  No apparent complications. Electronically Signed: By: Amie Portland M.D. On: 03/12/2023 09:47   MM CLIP PLACEMENT RIGHT Result Date: 03/12/2023 CLINICAL DATA:  Assess post biopsy marker clip placement following MRI guided core needle biopsy of a right breast mass. EXAM: 3D DIAGNOSTIC RIGHT MAMMOGRAM POST MRI BIOPSY COMPARISON:  Previous exam(s). FINDINGS: 3D Mammographic images were obtained following MRI guided biopsy of a right breast mass. The biopsy marking clip is in expected position at  the site of biopsy. There is a surrounding post biopsy hematoma measuring 1.8 cm in long axis. IMPRESSION: Appropriate positioning of the barbell shaped biopsy marking clip at the site of biopsy in the lower inner right breast. Final Assessment: Post Procedure Mammograms for Marker Placement Electronically Signed   By: Amie Portland M.D.   On: 03/12/2023 11:09   MM 3D DIAGNOSTIC MAMMOGRAM UNILATERAL RIGHT BREAST Result Date: 02/18/2023 CLINICAL DATA:  Screening recall for possible right breast asymmetry. The patient has had prior history of excision ule biopsies for lobular carcinoma in site 2 in the right breast in 2012 as well as 2018. Recent breast MRI dated 02/07/2023 demonstrated a 7 mm enhancing mass in the lower inner anterior right breast for which MRI guided biopsy was recommended. EXAM: DIGITAL DIAGNOSTIC UNILATERAL RIGHT MAMMOGRAM WITH TOMOSYNTHESIS AND CAD; ULTRASOUND RIGHT BREAST LIMITED TECHNIQUE: Right digital diagnostic mammography and breast tomosynthesis was performed. The images were evaluated with computer-aided detection. ; Targeted ultrasound examination of the right breast was performed COMPARISON:  Previous exam(s). ACR Breast Density Category b: There are scattered  areas of fibroglandular density. FINDINGS: Spot compression tomograms were performed of the inner right breast. The initially questioned possible right breast asymmetry resolves on the additional imaging with the fibroglandular pattern felt to be stable in appearance. Targeted ultrasound of the inner right breast was performed demonstrating multiple areas of generalize shadowing, suspicious masses or any other worrisome abnormalities identified. Specifically, there is no definite sonographic correlate for the enhancing mass seen in the lower inner anterior right breast on recent breast MRI. IMPRESSION: No findings of malignancy in the right breast. Specifically, no definite sonographic correlate for the enhancing mass seen in  the lower inner anterior right breast on recent breast MRI. RECOMMENDATION: Recommend MRI guided biopsy of the 6-7 mm enhancing mass in the lower inner right breast. I have discussed the findings and recommendations with the patient. If applicable, a reminder letter will be sent to the patient regarding the next appointment. BI-RADS CATEGORY  4: Suspicious. Electronically Signed   By: Edwin Cap M.D.   On: 02/18/2023 14:32   [place] LIMITED ULTRASOUND INCLUDING AXILLA RIGHT BREAST Result Date: 02/18/2023 CLINICAL DATA:  Screening recall for possible right breast asymmetry. The patient has had prior history of excision ule biopsies for lobular carcinoma in site 2 in the right breast in 2012 as well as 2018. Recent breast MRI dated 02/07/2023 demonstrated a 7 mm enhancing mass in the lower inner anterior right breast for which MRI guided biopsy was recommended. EXAM: DIGITAL DIAGNOSTIC UNILATERAL RIGHT MAMMOGRAM WITH TOMOSYNTHESIS AND CAD; ULTRASOUND RIGHT BREAST LIMITED TECHNIQUE: Right digital diagnostic mammography and breast tomosynthesis was performed. The images were evaluated with computer-aided detection. ; Targeted ultrasound examination of the right breast was performed COMPARISON:  Previous exam(s). ACR Breast Density Category b: There are scattered areas of fibroglandular density. FINDINGS: Spot compression tomograms were performed of the inner right breast. The initially questioned possible right breast asymmetry resolves on the additional imaging with the fibroglandular pattern felt to be stable in appearance. Targeted ultrasound of the inner right breast was performed demonstrating multiple areas of generalize shadowing, suspicious masses or any other worrisome abnormalities identified. Specifically, there is no definite sonographic correlate for the enhancing mass seen in the lower inner anterior right breast on recent breast MRI. IMPRESSION: No findings of malignancy in the right breast.  Specifically, no definite sonographic correlate for the enhancing mass seen in the lower inner anterior right breast on recent breast MRI. RECOMMENDATION: Recommend MRI guided biopsy of the 6-7 mm enhancing mass in the lower inner right breast. I have discussed the findings and recommendations with the patient. If applicable, a reminder letter will be sent to the patient regarding the next appointment. BI-RADS CATEGORY  4: Suspicious. Electronically Signed   By: Edwin Cap M.D.   On: 02/18/2023 14:32

## 2023-03-19 ENCOUNTER — Inpatient Hospital Stay: Payer: Commercial Managed Care - PPO | Admitting: Hematology

## 2023-03-19 ENCOUNTER — Other Ambulatory Visit: Payer: Self-pay | Admitting: *Deleted

## 2023-03-19 DIAGNOSIS — D0501 Lobular carcinoma in situ of right breast: Secondary | ICD-10-CM

## 2023-06-18 ENCOUNTER — Encounter: Payer: Self-pay | Admitting: Adult Health

## 2023-06-18 ENCOUNTER — Other Ambulatory Visit (HOSPITAL_COMMUNITY)
Admission: RE | Admit: 2023-06-18 | Discharge: 2023-06-18 | Disposition: A | Discharge status: Home / Self Care | Source: Ambulatory Visit | Attending: Adult Health | Admitting: Adult Health

## 2023-06-18 ENCOUNTER — Ambulatory Visit: Payer: Commercial Managed Care - PPO | Admitting: Adult Health

## 2023-06-18 VITALS — BP 140/98 | HR 77 | Ht 66.5 in | Wt 238.0 lb

## 2023-06-18 DIAGNOSIS — I10 Essential (primary) hypertension: Secondary | ICD-10-CM

## 2023-06-18 DIAGNOSIS — Z01419 Encounter for gynecological examination (general) (routine) without abnormal findings: Secondary | ICD-10-CM | POA: Diagnosis not present

## 2023-06-18 DIAGNOSIS — Z1211 Encounter for screening for malignant neoplasm of colon: Secondary | ICD-10-CM | POA: Diagnosis not present

## 2023-06-18 DIAGNOSIS — Z1331 Encounter for screening for depression: Secondary | ICD-10-CM | POA: Diagnosis not present

## 2023-06-18 DIAGNOSIS — Z853 Personal history of malignant neoplasm of breast: Secondary | ICD-10-CM | POA: Diagnosis not present

## 2023-06-18 LAB — HEMOCCULT GUIAC POC 1CARD (OFFICE): Fecal Occult Blood, POC: NEGATIVE

## 2023-06-18 MED ORDER — HYDROCHLOROTHIAZIDE 12.5 MG PO CAPS: 12.5000 mg | ORAL_CAPSULE | Freq: Every day | ORAL | 6 refills | Status: DC

## 2023-06-18 NOTE — Progress Notes (Signed)
 Patient ID: [name], female   DOB: 06/14/1970, 53 y.o.   MRN: [id number] History of Present Illness: Female is a 53 year old white female,married, G2P0202, in for a well woman gyn exam and pap. She has history of breast cancer. She is working out now and trying to lose weight, taking Beneve supplement too.   PCP is Dr Lydia Sams   Current Medications, Allergies, Past Medical History, Past Surgical History, Family History and Social History were reviewed in [employer] electronic medical record.     Review of Systems: Patient denies any headaches, hearing loss, fatigue, blurred vision, shortness of breath, chest pain, abdominal pain, problems with bowel movements, urination, or intercourse. No joint pain or mood swings.  No period since August Stress at work Trying to lose weight  Physical Exam:BP (!) 140/98 (BP Location: Right Arm, Patient Position: Sitting, Cuff Size: Large)   Pulse 77   Ht 5' 6.5" (1.689 m)   Wt 238 lb (108 kg)   BMI 37.84 kg/m   General:  Well developed, well nourished, no acute distress Skin:  Warm and dry Neck:  Midline trachea, normal thyroid , good ROM, no lymphadenopathy Lungs; Clear to auscultation bilaterally Breast:  No dominant palpable mass, retraction, or nipple discharge Cardiovascular: Regular rate and rhythm Abdomen:  Soft, non tender, no hepatosplenomegaly Pelvic:  External genitalia is normal in appearance, no lesions.  The vagina is normal in appearance. Urethra has no lesions or masses. The cervix is smooth, pap with HR HPV genotyping performed.  Uterus is felt to be normal size, shape, and contour.  No adnexal masses or tenderness noted.Bladder is non tender, no masses felt. Rectal: Good sphincter tone, no polyps, or hemorrhoids felt.  Hemoccult negative. Extremities/musculoskeletal:  No swelling or varicosities noted, no clubbing or cyanosis Psych:  No mood changes, alert and cooperative,seems happy AA is 4    06/18/2023    8:41 AM 05/06/2022    10:28 AM 03/13/2022    9:46 AM  Depression screen PHQ 2/9  Decreased Interest 0 0 0  Down, Depressed, Hopeless 0 0 0  PHQ - 2 Score 0 0 0  Altered sleeping 1 0   Tired, decreased energy 0 0   Change in appetite 0 0   Feeling bad or failure about yourself  1 0   Trouble concentrating 0 0   Moving slowly or fidgety/restless 1 0   Suicidal thoughts 0 0   PHQ-9 Score 3 0   Difficult doing work/chores  Not difficult at all        06/18/2023    8:41 AM 05/06/2022   10:28 AM 09/08/2020   11:27 AM 07/15/2019    2:18 PM  GAD 7 : Generalized Anxiety Score  Nervous, Anxious, on Edge 1 0 1 0  Control/stop worrying 1 0 0 0  Worry too much - different things 1 0 0 0  Trouble relaxing 1 0 1 0  Restless 0 0 1 0  Easily annoyed or irritable 0 0 0 0  Afraid - awful might happen 0 0 1 0  Total GAD 7 Score 4 0 4 0  Anxiety Difficulty  Not difficult at all      Upstream - 06/18/23 7829       Pregnancy Intention Screening   Does the patient want to become pregnant in the next year? No    Does the patient's partner want to become pregnant in the next year? No    Would the patient like to  discuss contraceptive options today? No      Contraception Wrap Up   Current Method Female Sterilization    End Method Female Sterilization    Contraception Counseling Provided No            Examination chaperoned by Tish RN     Impression and Plan: 1. Encounter for gynecological examination with Papanicolaou smear of cervix (Primary) Pap sent Pap in 3 years if normal Physical in 1 year Labs with Dr Linnell Richardson Colonoscopy per GI Had mammogram and MR 02/07/23 and had follow up mammogram 02/18/23  and biopsy 03/12/23 PASH like changes   2. Encounter for screening fecal occult blood testing Hemoccult was negative  - POCT occult blood stool  3. History of breast cancer   4. Hypertension, unspecified type Decrease salt and sugar Continue working out Will rx microzide  12.5 mg 1 daily  Meds ordered this  encounter  Medications   hydrochlorothiazide  (MICROZIDE ) 12.5 MG capsule    Sig: Take 1 capsule (12.5 mg total) by mouth daily.    Dispense:  30 capsule    Refill:  6    Supervising Provider:   Evalyn Hillier H [[id number]]   Follow up in 3 months for ROS and BP check

## 2023-06-18 NOTE — Addendum Note (Signed)
 Addended by: Myrl Askew on: 06/18/2023 11:15 AM   Modules accepted: Orders

## 2023-06-20 LAB — CYTOLOGY - PAP
Adequacy: ABSENT
Comment: NEGATIVE
Diagnosis: NEGATIVE
High risk HPV: NEGATIVE

## 2023-07-15 ENCOUNTER — Telehealth: Admitting: Physician Assistant

## 2023-07-15 DIAGNOSIS — R3989 Other symptoms and signs involving the genitourinary system: Secondary | ICD-10-CM

## 2023-07-15 MED ORDER — NITROFURANTOIN MONOHYD MACRO 100 MG PO CAPS: 100.0000 mg | ORAL_CAPSULE | Freq: Two times a day (BID) | ORAL | 0 refills | Status: AC

## 2023-07-15 NOTE — Patient Instructions (Signed)
  [name], thank you for joining Ann Gutta, PA-C for today's virtual visit.  While this provider is not your primary care provider (PCP), if your PCP is located in our provider database this encounter information will be shared with them immediately following your visit.   A [facility] MyChart account gives you access to today's visit and all your visits, tests, and labs performed at [facility] " click here if you don't have a [facility] MyChart account or go to [email or web address]  Consent: (Patient) [name] provided verbal consent for this virtual visit at the beginning of the encounter.  Current Medications:  Current Outpatient Medications:    nitrofurantoin , macrocrystal-monohydrate, (MACROBID ) 100 MG capsule, Take 1 capsule (100 mg total) by mouth 2 (two) times daily for 5 days., Disp: 10 capsule, Rfl: 0   cyclobenzaprine  (FLEXERIL ) 10 MG tablet, Take 1 tablet (10 mg total) by mouth at bedtime. One tablet every night at bedtime as needed for spasm. (Patient not taking: Reported on 06/18/2023), Disp: 30 tablet, Rfl: 0   hydrochlorothiazide  (MICROZIDE ) 12.5 MG capsule, Take 1 capsule (12.5 mg total) by mouth daily., Disp: 30 capsule, Rfl: 6   ibuprofen (ADVIL) 200 MG tablet, Take 800 mg by mouth every 8 (eight) hours as needed for moderate pain., Disp: , Rfl:    Medications ordered in this encounter:  Meds ordered this encounter  Medications   nitrofurantoin , macrocrystal-monohydrate, (MACROBID ) 100 MG capsule    Sig: Take 1 capsule (100 mg total) by mouth 2 (two) times daily for 5 days.    Dispense:  10 capsule    Refill:  0    Supervising Provider:   Corine Dice [[id number]]     *If you need refills on other medications prior to your next appointment, please contact your pharmacy*  Follow-Up: Call back or seek an in-person evaluation if the symptoms worsen or if the condition fails to improve as anticipated.  [facility] [phone]  Other Instructions Increase fluids Start Macrobid  as prescribed. Schedule a virtual appointment or follow up at an urgent care clinic if symptoms don't improve.    If you have been instructed to have an in-person evaluation today at a local Urgent Care facility, please use the link below. It will take you to a list of all of our available [facility], including address, phone number and hours of operation. Please do not delay care.  [facility]  If you or a family member do not have a primary care provider, use the link below to schedule a visit and establish care. When you choose a [facility] primary care physician or advanced practice provider, you gain a long-term partner in health. Find a Primary Care Provider  Learn more about [facility] in-office and virtual care options:  - Get Care Now

## 2023-07-15 NOTE — Progress Notes (Signed)
 Virtual Visit Consent   [name], you are scheduled for a virtual visit with a [facility] provider today. Just as with appointments in the office, your consent must be obtained to participate. Your consent will be active for this visit and any virtual visit you may have with one of our providers in the next 365 days. If you have a MyChart account, a copy of this consent can be sent to you electronically.  As this is a virtual visit, video technology does not allow for your provider to perform a traditional examination. This may limit your provider's ability to fully assess your condition. If your provider identifies any concerns that need to be evaluated in person or the need to arrange testing (such as labs, EKG, etc.), we will make arrangements to do so. Although advances in technology are sophisticated, we cannot ensure that it will always work on either your end or our end. If the connection with a video visit is poor, the visit may have to be switched to a telephone visit. With either a video or telephone visit, we are not always able to ensure that we have a secure connection.  By engaging in this virtual visit, you consent to the provision of healthcare and authorize for your insurance to be billed (if applicable) for the services provided during this visit. Depending on your insurance coverage, you may receive a charge related to this service.  I need to obtain your verbal consent now. Are you willing to proceed with your visit today? [name] has provided verbal consent on 07/15/2023 for a virtual visit (video or telephone). Wanita Gutta, PA-C  Date: 07/15/2023 6:25 PM   Virtual Visit via Video Note   I, Wanita Gutta, connected with  [name]  ([id number], 1970/11/06) on 07/15/23 at  6:30 PM EDT by a video-enabled telemedicine application and verified that I am speaking with the correct person using two identifiers.  Location: Patient: Virtual Visit Location Patient: Home Provider:  Virtual Visit Location Provider: Home Office   I discussed the limitations of evaluation and management by telemedicine and the availability of in person appointments. The patient expressed understanding and agreed to proceed.    History of Present Illness: [name] is a 53 y.o. who identifies as a female who was assigned female at birth, and is being seen today for urinary symptoms.  HPI: 53 y/o F presents via video telehealth visit for c/o urinary symptoms which started earlier today. +h/o mulitple UTIs. Denies fever or back pain.     Problems:  Patient Active Problem List   Diagnosis Date Noted   History of breast cancer 06/18/2023   Encounter for screening fecal occult blood testing 06/18/2023   Encounter for gynecological examination with Papanicolaou smear of cervix 06/18/2023   Acute bacterial bronchitis 03/13/2022   Erythrocytosis 03/13/2022   Dysphagia 11/13/2021   Hepatic steatosis 11/13/2021   Encounter for general adult medical examination with abnormal findings 09/06/2021   Seborrheic keratosis 05/30/2021   HLD (hyperlipidemia) 03/01/2021   Gastroesophageal reflux disease without esophagitis 03/01/2021   Morbid obesity (HCC) 03/01/2021   Prediabetes 10/26/2020   Peri-menopause 09/08/2020   Hypertension 06/12/2020   Anxiety 04/09/2019   History of right breast cancer 09/02/2016   Recurrent breast cancer, right (HCC) 09/02/2016   SUI (stress urinary incontinence, female) 09/02/2016   Lobular carcinoma in situ of right breast 12/03/2010    Allergies:  Allergies  Allergen Reactions   Augmentin [Amoxicillin-Pot Clavulanate] Hives  Medications:  Current Outpatient Medications:    nitrofurantoin , macrocrystal-monohydrate, (MACROBID ) 100 MG capsule, Take 1 capsule (100 mg total) by mouth 2 (two) times daily for 5 days., Disp: 10 capsule, Rfl: 0   cyclobenzaprine  (FLEXERIL ) 10 MG tablet, Take 1 tablet (10 mg total) by mouth at bedtime. One tablet every night at  bedtime as needed for spasm. (Patient not taking: Reported on 06/18/2023), Disp: 30 tablet, Rfl: 0   hydrochlorothiazide  (MICROZIDE ) 12.5 MG capsule, Take 1 capsule (12.5 mg total) by mouth daily., Disp: 30 capsule, Rfl: 6   ibuprofen (ADVIL) 200 MG tablet, Take 800 mg by mouth every 8 (eight) hours as needed for moderate pain., Disp: , Rfl:   Observations/Objective: Patient is well-developed, well-nourished in no acute distress.  Resting comfortably  at home.  Head is normocephalic, atraumatic.  No labored breathing.  Speech is clear and coherent with logical content.  Patient is alert and oriented at baseline.    Assessment and Plan: 1. Suspected UTI (Primary) - nitrofurantoin , macrocrystal-monohydrate, (MACROBID ) 100 MG capsule; Take 1 capsule (100 mg total) by mouth 2 (two) times daily for 5 days.  Dispense: 10 capsule; Refill: 0  Increase fluids Start Macrobid  as prescribed. Schedule a virtual appointment or follow up at an urgent care clinic if symptoms don't improve.  Pt verbalized understanding and in agreement.    Follow Up Instructions: I discussed the assessment and treatment plan with the patient. The patient was provided an opportunity to ask questions and all were answered. The patient agreed with the plan and demonstrated an understanding of the instructions.  A copy of instructions were sent to the patient via MyChart unless otherwise noted below.   Patient has requested to receive PHI (AVS, Work Notes, etc) pertaining to this video visit through e-mail as they are currently without active MyChart. They have voiced understand that email is not considered secure and their health information could be viewed by someone other than the patient.   The patient was advised to call back or seek an in-person evaluation if the symptoms worsen or if the condition fails to improve as anticipated.    Ann Griebel, PA-C

## 2023-08-18 ENCOUNTER — Ambulatory Visit (HOSPITAL_COMMUNITY): Admission: RE | Admit: 2023-08-18 | Payer: Commercial Managed Care - PPO | Source: Ambulatory Visit

## 2023-09-01 ENCOUNTER — Ambulatory Visit (HOSPITAL_COMMUNITY)
Admission: RE | Admit: 2023-09-01 | Discharge: 2023-09-01 | Disposition: A | Discharge status: Home / Self Care | Source: Ambulatory Visit | Attending: Hematology | Admitting: Hematology

## 2023-09-01 DIAGNOSIS — D0501 Lobular carcinoma in situ of right breast: Secondary | ICD-10-CM | POA: Insufficient documentation

## 2023-09-01 DIAGNOSIS — R928 Other abnormal and inconclusive findings on diagnostic imaging of breast: Secondary | ICD-10-CM | POA: Diagnosis not present

## 2023-09-01 DIAGNOSIS — N6314 Unspecified lump in the right breast, lower inner quadrant: Secondary | ICD-10-CM | POA: Diagnosis not present

## 2023-09-01 MED ORDER — GADOBUTROL 1 MMOL/ML IV SOLN
10.0000 mL | Freq: Once | INTRAVENOUS | Status: AC | PRN
  Administered 2023-09-01: 10 mL via INTRAVENOUS

## 2023-09-16 ENCOUNTER — Ambulatory Visit: Admitting: Adult Health

## 2023-09-18 ENCOUNTER — Ambulatory Visit: Admitting: Adult Health

## 2023-09-18 DIAGNOSIS — D225 Melanocytic nevi of trunk: Secondary | ICD-10-CM | POA: Diagnosis not present

## 2023-09-18 DIAGNOSIS — D485 Neoplasm of uncertain behavior of skin: Secondary | ICD-10-CM | POA: Diagnosis not present

## 2023-09-18 DIAGNOSIS — L820 Inflamed seborrheic keratosis: Secondary | ICD-10-CM | POA: Diagnosis not present

## 2023-09-18 DIAGNOSIS — Z1283 Encounter for screening for malignant neoplasm of skin: Secondary | ICD-10-CM | POA: Diagnosis not present

## 2023-09-22 ENCOUNTER — Ambulatory Visit: Admitting: Adult Health

## 2023-09-22 ENCOUNTER — Encounter: Payer: Self-pay | Admitting: Adult Health

## 2023-09-22 VITALS — BP 133/80 | HR 67 | Ht 66.5 in | Wt 236.0 lb

## 2023-09-22 DIAGNOSIS — I10 Essential (primary) hypertension: Secondary | ICD-10-CM

## 2023-09-22 NOTE — Progress Notes (Signed)
  Subjective:     Patient ID: [name], female   DOB: [birth year], 53 y.o.   MRN: [id number]  HPI [name] is a 53 year old white female,married, PM in for follow up on BP, started, Microzide  12.5 mg  3 months ago and feels good.  She is working out 3 x weekly.     Component Value Date/Time   DIAGPAP  06/18/2023 1115    - Negative for intraepithelial lesion or malignancy (NILM)   DIAGPAP  05/17/2019 1026    - Negative for intraepithelial lesion or malignancy (NILM)   DIAGPAP  09/02/2016 0000    NEGATIVE FOR INTRAEPITHELIAL LESIONS OR MALIGNANCY.   HPVHIGH Negative 06/18/2023 1115   HPVHIGH Negative 05/17/2019 1026   ADEQPAP  06/18/2023 1115    Satisfactory for evaluation; transformation zone component ABSENT.   ADEQPAP  05/17/2019 1026    Satisfactory for evaluation; transformation zone component PRESENT.   ADEQPAP  09/02/2016 0000    Satisfactory for evaluation  endocervical/transformation zone component PRESENT.   PCP is Dr Tobie Review of Systems Feels good Reviewed past medical,surgical, social and family history. Reviewed medications and allergies.     Objective:   Physical Exam BP 133/80 (BP Location: Left Arm, Patient Position: Sitting, Cuff Size: Large)   Pulse 67   Ht 5' 6.5 (1.689 m)   Wt 236 lb (107 kg)   LMP 11/27/2021   BMI 37.52 kg/m     Skin warm and dry. Lungs: clear to ausculation bilaterally. Cardiovascular: regular rate and rhythm.   Upstream - 09/22/23 1621       Pregnancy Intention Screening   Does the patient want to become pregnant in the next year? N/A    Does the patient's partner want to become pregnant in the next year? N/A    Would the patient like to discuss contraceptive options today? N/A      Contraception Wrap Up   Current Method Female Sterilization   PM   End Method Female Sterilization   PM   Contraception Counseling Provided No          Assessment:     1. Hypertension, unspecified type (Primary) BP is better, continue Microzide   12.5 mg 1 daily, has refills Follow up in 3 months for ROS   She is going to see Ileana Rosa in [place], for diet advice  Plan:     Follow up in 3 months

## 2023-10-03 ENCOUNTER — Encounter: Payer: Self-pay | Admitting: Radiology

## 2023-12-15 ENCOUNTER — Encounter: Payer: Self-pay | Admitting: Radiology

## 2023-12-23 ENCOUNTER — Ambulatory Visit: Admitting: Adult Health

## 2024-01-15 ENCOUNTER — Ambulatory Visit: Admitting: Adult Health

## 2024-01-15 ENCOUNTER — Encounter: Payer: Self-pay | Admitting: Adult Health

## 2024-01-15 ENCOUNTER — Other Ambulatory Visit (HOSPITAL_COMMUNITY): Payer: Self-pay

## 2024-01-15 VITALS — BP 128/87 | HR 76 | Ht 66.5 in | Wt 238.0 lb

## 2024-01-15 DIAGNOSIS — Z13220 Encounter for screening for lipoid disorders: Secondary | ICD-10-CM | POA: Diagnosis not present

## 2024-01-15 DIAGNOSIS — I10 Essential (primary) hypertension: Secondary | ICD-10-CM

## 2024-01-15 DIAGNOSIS — R718 Other abnormality of red blood cells: Secondary | ICD-10-CM | POA: Diagnosis not present

## 2024-01-15 DIAGNOSIS — D582 Other hemoglobinopathies: Secondary | ICD-10-CM

## 2024-01-15 MED ORDER — HYDROCHLOROTHIAZIDE 12.5 MG PO CAPS
12.5000 mg | ORAL_CAPSULE | Freq: Every day | ORAL | 3 refills | Status: AC
  Filled 2024-01-15 – 2024-01-22 (×2): qty 90, 90d supply, fill #0

## 2024-01-15 NOTE — Progress Notes (Signed)
  Subjective:     Patient ID: [name], female   DOB: [birth year], 53 y.o.   MRN: [id number]  HPI [name] is a 53 year old white female, married, PM, in for BP check, she stopped taking Microzide  about 3 weeks ago to see how BP was, and is still working out but is stressed. She requests labs too.     Component Value Date/Time   DIAGPAP  06/18/2023 1115    - Negative for intraepithelial lesion or malignancy (NILM)   DIAGPAP  05/17/2019 1026    - Negative for intraepithelial lesion or malignancy (NILM)   DIAGPAP  09/02/2016 0000    NEGATIVE FOR INTRAEPITHELIAL LESIONS OR MALIGNANCY.   HPVHIGH Negative 06/18/2023 1115   HPVHIGH Negative 05/17/2019 1026   ADEQPAP  06/18/2023 1115    Satisfactory for evaluation; transformation zone component ABSENT.   ADEQPAP  05/17/2019 1026    Satisfactory for evaluation; transformation zone component PRESENT.   ADEQPAP  09/02/2016 0000    Satisfactory for evaluation  endocervical/transformation zone component PRESENT.    PCP is Dr Tobie Review of Systems +stress Feels good Reviewed past medical,surgical, social and family history. Reviewed medications and allergies.     Objective:   Physical Exam BP 128/87 (BP Location: Left Arm, Patient Position: Sitting, Cuff Size: Large)   Pulse 76   Ht 5' 6.5 (1.689 m)   Wt 238 lb (108 kg)   LMP 11/27/2021   BMI 37.84 kg/m     Skin warm and dry.  Lungs: clear to ausculation bilaterally. Cardiovascular: regular rate and rhythm.  Fall risk is low  Upstream - 01/15/24 1424       Pregnancy Intention Screening   Does the patient want to become pregnant in the next year? N/A    Does the patient's partner want to become pregnant in the next year? N/A    Would the patient like to discuss contraceptive options today? N/A      Contraception Wrap Up   Current Method Female Sterilization;Post-Menopause    End Method Female Sterilization;Post-Menopause    Contraception Counseling Provided No           Assessment:     1. Hypertension, unspecified type (Primary) Take BP meds and keep check at home Refilled Microzide  12.5 mg 1 daily Meds ordered this encounter  Medications   hydrochlorothiazide  (MICROZIDE ) 12.5 MG capsule    Sig: Take 1 capsule (12.5 mg total) by mouth daily.    Dispense:  90 capsule    Refill:  3    Supervising Provider:   JAYNE VONN DEL [[id number]]   Will recheck in about 6 months  - Comprehensive metabolic panel with GFR  2. Screening cholesterol level - Lipid panel  3. Elevated hemoglobin - CBC     Plan:     Return about 06/24/24 for physical

## 2024-01-22 ENCOUNTER — Ambulatory Visit
Admission: EM | Admit: 2024-01-22 | Discharge: 2024-01-22 | Disposition: A | Discharge status: Home / Self Care | Source: Home / Self Care

## 2024-01-22 ENCOUNTER — Other Ambulatory Visit: Payer: Self-pay

## 2024-01-22 ENCOUNTER — Other Ambulatory Visit (HOSPITAL_COMMUNITY): Payer: Self-pay

## 2024-01-22 DIAGNOSIS — J209 Acute bronchitis, unspecified: Secondary | ICD-10-CM

## 2024-01-22 MED ORDER — PREDNISONE 20 MG PO TABS: 40.0000 mg | ORAL_TABLET | Freq: Every day | ORAL | 0 refills | Status: AC

## 2024-01-22 MED ORDER — AZITHROMYCIN 250 MG PO TABS: 250.0000 mg | ORAL_TABLET | Freq: Every day | ORAL | 0 refills | Status: DC

## 2024-01-22 MED ORDER — PROMETHAZINE-DM 6.25-15 MG/5ML PO SYRP: 5.0000 mL | ORAL_SOLUTION | Freq: Four times a day (QID) | ORAL | 0 refills | Status: DC | PRN

## 2024-01-22 MED ORDER — ALBUTEROL SULFATE HFA 108 (90 BASE) MCG/ACT IN AERS: 2.0000 | INHALATION_SPRAY | Freq: Four times a day (QID) | RESPIRATORY_TRACT | 0 refills | Status: DC | PRN

## 2024-01-22 NOTE — Discharge Instructions (Signed)
 Take medication as prescribed.  I would like for you to start with the prednisone  and cough medication.  If your symptoms fail to improve over the next 3 to 5 days, begin the antibiotic prescribed. Increase fluids and allow for plenty of rest. You may continue over-the-counter Tylenol  or ibuprofen as needed for pain, fever, or general discomfort. Continue the use of cough drops as needed. Recommend the use of a humidifier in your bedroom at nighttime during sleep and sleeping elevated on pillows while symptoms persist. As discussed, your cough may continue to linger despite completing the medications.  If you are generally feeling well, but continued to have a persistent nagging cough, continue use of cough drops, fluids, and over-the-counter medications.  If you develop new symptoms such as fever, chills, wheezing, shortness of breath, difficulty breathing, seek care. Follow-up as needed.

## 2024-01-22 NOTE — ED Provider Notes (Signed)
 [facility]    CSN: [id number] Arrival date & time: 01/22/24  0919      History   Chief Complaint No chief complaint on file.   HPI [name] is a 53 y.o. female.   The history is provided by the patient.   Patient presents with a 1 week history of cough, fatigue, chest discomfort, and generalized bodyaches.  She denies fever, chills, headache, ear pain, wheezing, difficulty breathing, abdominal pain, nausea, vomiting, diarrhea, or rash.  Patient states that her cough appears to be worsening.  She states that she is also experiencing postnasal drainage which is exacerbating her cough.  Denies prior history of asthma or smoking.  Denies any obvious close sick contacts, but does work with the general population.  So far, states she has been taking ibuprofen and using cough drops for her symptoms.  Past Medical History:  Diagnosis Date   Breast cancer (HCC) [phone]   rt breast   Breast disorder    cancer right breast   Cancer (HCC)    right lobular carcinoma in situ   Gastroparesis    controlled    Patient Active Problem List   Diagnosis Date Noted   History of breast cancer 06/18/2023   Encounter for screening fecal occult blood testing 06/18/2023   Encounter for gynecological examination with Papanicolaou smear of cervix 06/18/2023   Acute bacterial bronchitis 03/13/2022   Erythrocytosis 03/13/2022   Dysphagia 11/13/2021   Hepatic steatosis 11/13/2021   Encounter for general adult medical examination with abnormal findings 09/06/2021   Seborrheic keratosis 05/30/2021   HLD (hyperlipidemia) 03/01/2021   Gastroesophageal reflux disease without esophagitis 03/01/2021   Morbid obesity (HCC) 03/01/2021   Prediabetes 10/26/2020   Peri-menopause 09/08/2020   Hypertension 06/12/2020   Anxiety 04/09/2019   History of right breast cancer 09/02/2016   Recurrent breast cancer, right (HCC) 09/02/2016   SUI (stress urinary incontinence, female) 09/02/2016    Lobular carcinoma in situ of right breast 12/03/2010    Past Surgical History:  Procedure Laterality Date   BACK SURGERY  10/2002   lumbar hemilaminectomy, microdiscectomy L5-S1   BALLOON DILATION N/A 12/14/2021   Procedure: BALLOON DILATION;  Surgeon: Cindie Carlin POUR, DO;  Location: [facility];  Service: Endoscopy;  Laterality: N/A;   BREAST BIOPSY Right 07/2016   malignant; waiting on MRI results   BREAST BIOPSY Right 03/2023   BREAST LUMPECTOMY Right 2012,2018   BREAST LUMPECTOMY WITH RADIOACTIVE SEED LOCALIZATION Right 10/08/2016   Procedure: RIGHT BREAST LUMPECTOMY WITH RADIOACTIVE SEED LOCALIZATION;  Surgeon: Gail Favorite, MD;  Location: [facility];  Service: General;  Laterality: Right;   BREAST SURGERY     COLONOSCOPY WITH PROPOFOL  N/A 12/14/2021   Dr. Cindie: non-bleeding internal hemorrhoids, three 4-7 mm polyps (tubular and hyperplastic). Surveillance in 5 years   ESOPHAGOGASTRODUODENOSCOPY  08/2007   normal esophagus, large gastric diverticulum, patent pylorus   ESOPHAGOGASTRODUODENOSCOPY (EGD) WITH PROPOFOL  N/A 12/14/2021   3 cm hiatal hernia, gastric diverticulum, four gastric fundic gland polyps, empiric dilation   POLYPECTOMY  12/14/2021   Procedure: POLYPECTOMY;  Surgeon: Cindie Carlin POUR, DO;  Location: [facility];  Service: Endoscopy;;   TUBAL LIGATION  2001    OB History     Gravida  2   Para  2   Term      Preterm  2   AB      Living  2      SAB  IAB      Ectopic      Multiple      Live Births  2            Home Medications    Prior to Admission medications  Medication Sig Start Date End Date Taking? Authorizing Provider  albuterol  (VENTOLIN  HFA) 108 (90 Base) MCG/ACT inhaler Inhale 2 puffs into the lungs every 6 (six) hours as needed. 01/22/24  Yes Leath-Warren, Etta PARAS, NP  azithromycin  (ZITHROMAX ) 250 MG tablet Take 1 tablet (250 mg total) by mouth daily. Take first 2 tablets together, then 1  every day until finished. 01/22/24  Yes Leath-Warren, Etta PARAS, NP  predniSONE  (DELTASONE ) 20 MG tablet Take 2 tablets (40 mg total) by mouth daily with breakfast for 5 days. 01/22/24 01/27/24 Yes Leath-Warren, Etta PARAS, NP  promethazine -dextromethorphan (PROMETHAZINE -DM) 6.25-15 MG/5ML syrup Take 5 mLs by mouth 4 (four) times daily as needed. 01/22/24  Yes Leath-Warren, Etta PARAS, NP  hydrochlorothiazide  (MICROZIDE ) 12.5 MG capsule Take 1 capsule (12.5 mg total) by mouth daily. 01/15/24   Signa Delon LABOR, NP  ibuprofen (ADVIL) 200 MG tablet Take 800 mg by mouth every 8 (eight) hours as needed for moderate pain.    [provider]    Family History Family History  Problem Relation Age of Onset   Hypertension Mother    Hyperlipidemia Mother    Diverticulitis Mother    Hernia Mother    Heart disease Father        arrythmia   Hypertension Father    Arthritis Brother    Heart attack Maternal Grandmother    Aneurysm Maternal Grandfather    Congestive Heart Failure Paternal Grandmother    Cancer Maternal Uncle        pancreatic   Colon cancer Neg Hx    Colon polyps Neg Hx     Social History Social History[1]   Allergies   Augmentin [amoxicillin-pot clavulanate]   Review of Systems Review of Systems Per HPI  Physical Exam Triage Vital Signs ED Triage Vitals  Encounter Vitals Group     BP 01/22/24 0947 130/84     Girls Systolic BP Percentile --      Girls Diastolic BP Percentile --      Boys Systolic BP Percentile --      Boys Diastolic BP Percentile --      Pulse Rate 01/22/24 0947 78     Resp 01/22/24 0947 20     Temp 01/22/24 0947 98.3 F (36.8 C)     Temp Source 01/22/24 0947 Oral     SpO2 01/22/24 0947 97 %     Weight --      Height --      Head Circumference --      Peak Flow --      Pain Score 01/22/24 0950 6     Pain Loc --      Pain Education --      Exclude from Growth Chart --    No data found.  Updated Vital Signs BP 130/84 (BP  Location: Right Arm)   Pulse 78   Temp 98.3 F (36.8 C) (Oral)   Resp 20   LMP 11/27/2021   SpO2 97%   Visual Acuity Right Eye Distance:   Left Eye Distance:   Bilateral Distance:    Right Eye Near:   Left Eye Near:    Bilateral Near:     Physical Exam Vitals and nursing note reviewed.  Constitutional:  General: She is not in acute distress.    Appearance: Normal appearance.  HENT:     Head: Normocephalic.     Right Ear: Tympanic membrane, ear canal and external ear normal.     Left Ear: Tympanic membrane, ear canal and external ear normal.     Nose: Nose normal. No congestion.     Right Turbinates: Enlarged and swollen.     Left Turbinates: Enlarged and swollen.     Right Sinus: No maxillary sinus tenderness or frontal sinus tenderness.     Left Sinus: No maxillary sinus tenderness or frontal sinus tenderness.     Mouth/Throat:     Lips: Pink.     Mouth: Mucous membranes are moist.     Pharynx: Postnasal drip present. No pharyngeal swelling, oropharyngeal exudate, posterior oropharyngeal erythema or uvula swelling.     Comments: Cobblestoning present to posterior oropharynx  Eyes:     Extraocular Movements: Extraocular movements intact.     Conjunctiva/sclera: Conjunctivae normal.     Pupils: Pupils are equal, round, and reactive to light.  Cardiovascular:     Rate and Rhythm: Normal rate and regular rhythm.     Pulses: Normal pulses.     Heart sounds: Normal heart sounds.  Pulmonary:     Effort: Pulmonary effort is normal. No respiratory distress.     Breath sounds: Normal breath sounds. No stridor. No wheezing, rhonchi or rales.  Abdominal:     General: Bowel sounds are normal.     Palpations: Abdomen is soft.  Musculoskeletal:     Cervical back: Normal range of motion.  Skin:    General: Skin is warm and dry.  Neurological:     General: No focal deficit present.     Mental Status: She is alert and oriented to person, place, and time.  Psychiatric:         Mood and Affect: Mood normal.        Behavior: Behavior normal.      UC Treatments / Results  Labs (all labs ordered are listed, but only abnormal results are displayed) Labs Reviewed - No data to display  EKG   Radiology No results found.  Procedures Procedures (including critical care time)  Medications Ordered in UC Medications - No data to display  Initial Impression / Assessment and Plan / UC Course  I have reviewed the triage vital signs and the nursing notes.  Pertinent labs & imaging results that were available during my care of the patient were reviewed by me and considered in my medical decision making (see chart for details).  On exam, the patient's lung sounds are clear throughout, room air sats are at 97%.  The patient's vital signs are stable, she is well-appearing, and is in no acute distress.  Symptoms are consistent with acute bronchitis.  Will provide symptomatic treatment with prednisone  40 mg, Promethazine  DM for the cough, and an albuterol  inhaler as needed.  Azithromycin  also prescribed in the event the patient's symptoms fail to improve.  Supportive care recommendations were provided and discussed with the patient to include fluids, rest, over-the-counter analgesics, and use of a humidifier at nighttime during sleep.  Discussed indications with the patient regarding follow-up.  Patient was in agreement with this plan of care and verbalizes understanding.  All questions were answered.  Patient stable for discharge.   Final Clinical Impressions(s) / UC Diagnoses   Final diagnoses:  Acute bronchitis, unspecified organism     Discharge Instructions  Take medication as prescribed.  I would like for you to start with the prednisone  and cough medication.  If your symptoms fail to improve over the next 3 to 5 days, begin the antibiotic prescribed. Increase fluids and allow for plenty of rest. You may continue over-the-counter Tylenol  or ibuprofen as  needed for pain, fever, or general discomfort. Continue the use of cough drops as needed. Recommend the use of a humidifier in your bedroom at nighttime during sleep and sleeping elevated on pillows while symptoms persist. As discussed, your cough may continue to linger despite completing the medications.  If you are generally feeling well, but continued to have a persistent nagging cough, continue use of cough drops, fluids, and over-the-counter medications.  If you develop new symptoms such as fever, chills, wheezing, shortness of breath, difficulty breathing, seek care. Follow-up as needed.     ED Prescriptions     Medication Sig Dispense Auth. Provider   predniSONE  (DELTASONE ) 20 MG tablet Take 2 tablets (40 mg total) by mouth daily with breakfast for 5 days. 10 tablet Leath-Warren, Etta PARAS, NP   promethazine -dextromethorphan (PROMETHAZINE -DM) 6.25-15 MG/5ML syrup Take 5 mLs by mouth 4 (four) times daily as needed. 118 mL Leath-Warren, Etta PARAS, NP   albuterol  (VENTOLIN  HFA) 108 (90 Base) MCG/ACT inhaler Inhale 2 puffs into the lungs every 6 (six) hours as needed. 8 g Leath-Warren, Etta PARAS, NP   azithromycin  (ZITHROMAX ) 250 MG tablet Take 1 tablet (250 mg total) by mouth daily. Take first 2 tablets together, then 1 every day until finished. 6 tablet Leath-Warren, Etta PARAS, NP      PDMP not reviewed this encounter.     [1]  Social History Tobacco Use   Smoking status: Former    Current packs/day: 0.00    Average packs/day: 1 pack/day for 16.0 years (16.0 ttl pk-yrs)    Types: Cigarettes    Start date: 08/12/1984    Quit date: 08/12/2000    Years since quitting: 23.4   Smokeless tobacco: Never   Tobacco comments:    quit 10 yrs  Vaping Use   Vaping status: Never Used  Substance Use Topics   Alcohol use: Yes    Comment: occasionally   Drug use: No     Gilmer Etta PARAS, NP 01/22/24 1002

## 2024-01-22 NOTE — ED Triage Notes (Signed)
 Pt reports body aches, cough, congestion, fatigued chills,  x 1 week

## 2024-02-09 ENCOUNTER — Other Ambulatory Visit (HOSPITAL_COMMUNITY): Payer: Commercial Managed Care - PPO

## 2024-02-10 ENCOUNTER — Ambulatory Visit: Payer: Self-pay | Admitting: Adult Health

## 2024-02-10 ENCOUNTER — Other Ambulatory Visit (HOSPITAL_COMMUNITY): Payer: Self-pay | Admitting: Oncology

## 2024-02-10 ENCOUNTER — Other Ambulatory Visit (HOSPITAL_COMMUNITY)
Admission: RE | Admit: 2024-02-10 | Discharge: 2024-02-10 | Disposition: A | Discharge status: Home / Self Care | Source: Ambulatory Visit | Attending: Adult Health | Admitting: Adult Health

## 2024-02-10 DIAGNOSIS — D582 Other hemoglobinopathies: Secondary | ICD-10-CM | POA: Diagnosis not present

## 2024-02-10 DIAGNOSIS — Z13220 Encounter for screening for lipoid disorders: Secondary | ICD-10-CM | POA: Insufficient documentation

## 2024-02-10 DIAGNOSIS — I10 Essential (primary) hypertension: Secondary | ICD-10-CM | POA: Insufficient documentation

## 2024-02-10 DIAGNOSIS — Z1231 Encounter for screening mammogram for malignant neoplasm of breast: Secondary | ICD-10-CM

## 2024-02-10 LAB — CBC
HCT: 48 % — ABNORMAL HIGH (ref 36.0–46.0)
Hemoglobin: 16.4 g/dL — ABNORMAL HIGH (ref 12.0–15.0)
MCH: 31.7 pg (ref 26.0–34.0)
MCHC: 34.2 g/dL (ref 30.0–36.0)
MCV: 92.7 fL (ref 80.0–100.0)
Platelets: 212 K/uL (ref 150–400)
RBC: 5.18 MIL/uL — ABNORMAL HIGH (ref 3.87–5.11)
RDW: 12.2 % (ref 11.5–15.5)
WBC: 5.5 K/uL (ref 4.0–10.5)
nRBC: 0 % (ref 0.0–0.2)

## 2024-02-10 LAB — COMPREHENSIVE METABOLIC PANEL WITH GFR
ALT: 42 U/L (ref 0–44)
AST: 36 U/L (ref 15–41)
Albumin: 4.7 g/dL (ref 3.5–5.0)
Alkaline Phosphatase: 78 U/L (ref 38–126)
Anion gap: 15 (ref 5–15)
BUN: 14 mg/dL (ref 6–20)
CO2: 20 mmol/L — ABNORMAL LOW (ref 22–32)
Calcium: 9.6 mg/dL (ref 8.9–10.3)
Chloride: 100 mmol/L (ref 98–111)
Creatinine, Ser: 0.83 mg/dL (ref 0.44–1.00)
GFR, Estimated: >60 mL/min
Glucose, Bld: 107 mg/dL — ABNORMAL HIGH (ref 70–99)
Potassium: 4.2 mmol/L (ref 3.5–5.1)
Sodium: 136 mmol/L (ref 135–145)
Total Bilirubin: 0.6 mg/dL (ref 0.0–1.2)
Total Protein: 7.8 g/dL (ref 6.5–8.1)

## 2024-02-10 LAB — LIPID PANEL
Cholesterol: 305 mg/dL — ABNORMAL HIGH (ref 0–200)
HDL: 80 mg/dL
LDL Cholesterol: 197 mg/dL — ABNORMAL HIGH (ref 0–99)
Total CHOL/HDL Ratio: 3.8 ratio
Triglycerides: 141 mg/dL
VLDL: 28 mg/dL (ref 0–40)

## 2024-02-13 ENCOUNTER — Ambulatory Visit (HOSPITAL_COMMUNITY)
Admission: RE | Admit: 2024-02-13 | Discharge: 2024-02-13 | Disposition: A | Discharge status: Home / Self Care | Source: Ambulatory Visit | Attending: Oncology | Admitting: Oncology

## 2024-02-13 DIAGNOSIS — Z1231 Encounter for screening mammogram for malignant neoplasm of breast: Secondary | ICD-10-CM | POA: Diagnosis present

## 2024-02-16 ENCOUNTER — Encounter (HOSPITAL_COMMUNITY): Payer: Self-pay

## 2024-02-16 ENCOUNTER — Other Ambulatory Visit (HOSPITAL_COMMUNITY): Payer: Self-pay

## 2024-02-16 MED ORDER — ROSUVASTATIN CALCIUM 10 MG PO TABS
10.0000 mg | ORAL_TABLET | Freq: Every day | ORAL | 3 refills | Status: AC
  Filled 2024-02-16 – 2024-02-23 (×2): qty 90, 90d supply, fill #0

## 2024-02-23 ENCOUNTER — Other Ambulatory Visit: Payer: Self-pay

## 2024-02-23 ENCOUNTER — Other Ambulatory Visit (HOSPITAL_COMMUNITY): Payer: Self-pay

## 2024-02-24 ENCOUNTER — Other Ambulatory Visit (HOSPITAL_COMMUNITY): Payer: Self-pay

## 2024-03-02 ENCOUNTER — Inpatient Hospital Stay: Payer: Commercial Managed Care - PPO | Admitting: Oncology

## 2024-03-04 ENCOUNTER — Ambulatory Visit: Admitting: Adult Health

## 2024-03-04 ENCOUNTER — Encounter: Payer: Self-pay | Admitting: Adult Health

## 2024-03-04 VITALS — BP 121/85 | HR 71 | Ht 66.5 in | Wt 235.0 lb

## 2024-03-04 DIAGNOSIS — Z853 Personal history of malignant neoplasm of breast: Secondary | ICD-10-CM

## 2024-03-04 DIAGNOSIS — L2989 Other pruritus: Secondary | ICD-10-CM | POA: Diagnosis not present

## 2024-03-04 DIAGNOSIS — N950 Postmenopausal bleeding: Secondary | ICD-10-CM | POA: Diagnosis not present

## 2024-03-04 DIAGNOSIS — L299 Pruritus, unspecified: Secondary | ICD-10-CM | POA: Insufficient documentation

## 2024-03-04 NOTE — Progress Notes (Signed)
" °  Subjective:     Patient ID: [name], female   DOB: 1970-09-16, 54 y.o.   MRN: [id number]  HPI [name] is a 54 year old white female, married, PM in complaining of having had vaginal bleeding for 1 day first week of January and breast itch esp nipples. She did says she had cramping the day before the bleeding. She has history of breast and did take tamoxifen  for a while.     Component Value Date/Time   DIAGPAP  06/18/2023 1115    - Negative for intraepithelial lesion or malignancy (NILM)   DIAGPAP  05/17/2019 1026    - Negative for intraepithelial lesion or malignancy (NILM)   DIAGPAP  09/02/2016 0000    NEGATIVE FOR INTRAEPITHELIAL LESIONS OR MALIGNANCY.   HPVHIGH Negative 06/18/2023 1115   HPVHIGH Negative 05/17/2019 1026   ADEQPAP  06/18/2023 1115    Satisfactory for evaluation; transformation zone component ABSENT.   ADEQPAP  05/17/2019 1026    Satisfactory for evaluation; transformation zone component PRESENT.   ADEQPAP  09/02/2016 0000    Satisfactory for evaluation  endocervical/transformation zone component PRESENT.    PCP is Dr Tobie  Review of Systems +vaginal bleeding for 1 day first week of January  + breast itch esp nipples.  +had cramping the day before the bleeding.   Reviewed past medical,surgical, social and family history. Reviewed medications and allergies.  Objective:   Physical Exam BP 121/85 (BP Location: Right Arm, Patient Position: Sitting, Cuff Size: Large)   Pulse 71   Ht 5' 6.5 (1.689 m)   Wt 235 lb (106.6 kg)   LMP 11/27/2021   BMI 37.36 kg/m    Skin warm and dry,  Breasts:no dominate palpable mass, retraction or nipple discharge   Pelvic: external genitalia is normal in appearance no lesions, vagina: pale,urethra has no lesions or masses noted, cervix:smooth, uterus: normal size, shape and contour, non tender, no masses felt, adnexa: no masses or tenderness noted. Bladder is non tender and no masses felt.  Fall risk is low  Upstream - 03/04/24  1622       Pregnancy Intention Screening   Does the patient want to become pregnant in the next year? N/A    Does the patient's partner want to become pregnant in the next year? N/A    Would the patient like to discuss contraceptive options today? N/A      Contraception Wrap Up   Current Method Female Sterilization;Post-Menopause    End Method Female Sterilization;Post-Menopause    Contraception Counseling Provided No         Examination chaperoned by Clarita Salt LPN  Assessment:     1. PMB (postmenopausal bleeding) (Primary) Had vaginal bleeding for 1 day Will get vaginal US  in office 03/15/24 to assess uterus and ovaries, if endometrial lining thickened will need biopsy  - US  PELVIC COMPLETE WITH TRANSVAGINAL; Future  2. History of breast cancer Did take tamoxifen  for a while Had normal mammogram 02/13/2024 Get MRI in July 2026  3. Itching Breasts itching esp nipples Use good lotion     Plan:     Return 03/15/24 at 1:30 pm for vaginal US  in office    "

## 2024-03-15 ENCOUNTER — Encounter: Payer: Self-pay | Admitting: *Deleted

## 2024-03-15 ENCOUNTER — Other Ambulatory Visit: Admitting: Radiology

## 2024-03-17 ENCOUNTER — Other Ambulatory Visit: Payer: Self-pay | Admitting: Adult Health

## 2024-03-17 ENCOUNTER — Ambulatory Visit: Admitting: *Deleted

## 2024-03-17 DIAGNOSIS — R8290 Unspecified abnormal findings in urine: Secondary | ICD-10-CM

## 2024-03-17 DIAGNOSIS — R3915 Urgency of urination: Secondary | ICD-10-CM

## 2024-03-17 DIAGNOSIS — R300 Dysuria: Secondary | ICD-10-CM

## 2024-03-17 LAB — POCT URINALYSIS DIPSTICK OB
Glucose, UA: NEGATIVE
Ketones, UA: NEGATIVE
Nitrite, UA: NEGATIVE
POC,PROTEIN,UA: NEGATIVE

## 2024-03-17 MED ORDER — SULFAMETHOXAZOLE-TRIMETHOPRIM 800-160 MG PO TABS: 1.0000 | ORAL_TABLET | Freq: Two times a day (BID) | ORAL | 0 refills | Status: AC

## 2024-03-17 NOTE — Progress Notes (Signed)
" ° °  NURSE VISIT- UTI SYMPTOMS   SUBJECTIVE:  [name] is a 54 y.o. [phone] female here for UTI symptoms. She is a GYN patient. She reports dysuria, urinary urgency, and urine odor.  OBJECTIVE:  LMP 11/27/2021   Appears well, in no apparent distress  Results for orders placed or performed in visit on 03/17/24 (from the past 24 hours)  POC Urinalysis Dipstick OB   Collection Time: 03/17/24  4:02 PM  Result Value Ref Range   Color, UA     Clarity, UA     Glucose, UA Negative Negative   Bilirubin, UA     Ketones, UA neg    Spec Grav, UA     Blood, UA 1+    pH, UA     POC,PROTEIN,UA Negative Negative, Trace, Small (1+), Moderate (2+), Large (3+), 4+   Urobilinogen, UA     Nitrite, UA neg    Leukocytes, UA Small (1+) (A) Negative   Appearance     Odor      ASSESSMENT: GYN patient with UTI symptoms and negative nitrites  PLAN: Note routed to Delon Lewis, NP   Rx sent by provider today: Yes, patient requesting Urine culture sent Call or return to clinic prn if these symptoms worsen or fail to improve as anticipated. Follow-up: as needed   Kingstyn Deruiter  03/17/2024 4:02 PM  "

## 2024-03-17 NOTE — Progress Notes (Signed)
 Rx septra  ds to [facility] [address]

## 2024-03-18 LAB — URINALYSIS, ROUTINE W REFLEX MICROSCOPIC
Bilirubin, UA: NEGATIVE
Glucose, UA: NEGATIVE
Ketones, UA: NEGATIVE
Nitrite, UA: NEGATIVE
Protein,UA: NEGATIVE
Specific Gravity, UA: 1.009 (ref 1.005–1.030)
Urobilinogen, Ur: 0.2 mg/dL (ref 0.2–1.0)
pH, UA: 6.5 (ref 5.0–7.5)

## 2024-03-18 LAB — MICROSCOPIC EXAMINATION
Casts: NONE SEEN /LPF
WBC, UA: >30 /HPF — AB (ref 0–5)

## 2024-03-23 ENCOUNTER — Inpatient Hospital Stay: Admitting: Oncology

## 2024-03-24 ENCOUNTER — Other Ambulatory Visit: Admitting: Radiology

## 2024-05-10 ENCOUNTER — Other Ambulatory Visit
